# Patient Record
Sex: Female | Born: 1940 | Race: Black or African American | Hispanic: No | Smoking: Never smoker
Health system: Southern US, Community
[De-identification: ages and names within clinical notes are randomized; demographics above are authoritative.]

## PROBLEM LIST (undated history)

## (undated) DIAGNOSIS — R221 Localized swelling, mass and lump, neck: Secondary | ICD-10-CM

## (undated) DIAGNOSIS — I10 Essential (primary) hypertension: Secondary | ICD-10-CM

## (undated) DIAGNOSIS — R220 Localized swelling, mass and lump, head: Secondary | ICD-10-CM

## (undated) DIAGNOSIS — E11329 Type 2 diabetes mellitus with mild nonproliferative diabetic retinopathy without macular edema: Secondary | ICD-10-CM

## (undated) DIAGNOSIS — L500 Allergic urticaria: Secondary | ICD-10-CM

## (undated) DIAGNOSIS — G459 Transient cerebral ischemic attack, unspecified: Secondary | ICD-10-CM

## (undated) DIAGNOSIS — E119 Type 2 diabetes mellitus without complications: Secondary | ICD-10-CM

## (undated) DIAGNOSIS — E785 Hyperlipidemia, unspecified: Secondary | ICD-10-CM

## (undated) DIAGNOSIS — E041 Nontoxic single thyroid nodule: Secondary | ICD-10-CM

## (undated) HISTORY — DX: Essential (primary) hypertension: I10

## (undated) HISTORY — DX: Type 2 diabetes mellitus without complications: E11.9

## (undated) HISTORY — DX: Localized swelling, mass and lump, head: R22.1

## (undated) HISTORY — DX: Hyperlipidemia, unspecified: E78.5

## (undated) HISTORY — DX: Nontoxic single thyroid nodule: E04.1

## (undated) HISTORY — DX: Transient cerebral ischemic attack, unspecified: G45.9

## (undated) HISTORY — DX: Type 2 diabetes mellitus with mild nonproliferative diabetic retinopathy without macular edema: E11.329

## (undated) HISTORY — DX: Allergic urticaria: L50.0

## (undated) HISTORY — DX: Localized swelling, mass and lump, head: R22.0

---

## 2004-03-03 ENCOUNTER — Other Ambulatory Visit: Admission: RE | Admit: 2004-03-03 | Discharge: 2004-03-03 | Payer: Self-pay | Admitting: Otolaryngology

## 2007-02-16 HISTORY — PX: OTHER SURGICAL HISTORY: SHX169

## 2008-07-31 ENCOUNTER — Emergency Department (HOSPITAL_COMMUNITY): Admission: EM | Admit: 2008-07-31 | Discharge: 2008-07-31 | Payer: Self-pay | Admitting: Emergency Medicine

## 2008-12-16 HISTORY — PX: CATARACT EXTRACTION: SUR2

## 2009-01-02 DIAGNOSIS — E119 Type 2 diabetes mellitus without complications: Secondary | ICD-10-CM | POA: Insufficient documentation

## 2009-01-02 DIAGNOSIS — E11319 Type 2 diabetes mellitus with unspecified diabetic retinopathy without macular edema: Secondary | ICD-10-CM | POA: Insufficient documentation

## 2009-01-03 ENCOUNTER — Ambulatory Visit: Payer: Self-pay | Admitting: Internal Medicine

## 2009-01-03 DIAGNOSIS — R220 Localized swelling, mass and lump, head: Secondary | ICD-10-CM | POA: Insufficient documentation

## 2009-01-03 DIAGNOSIS — R221 Localized swelling, mass and lump, neck: Secondary | ICD-10-CM | POA: Insufficient documentation

## 2009-01-03 DIAGNOSIS — R5381 Other malaise: Secondary | ICD-10-CM | POA: Insufficient documentation

## 2009-01-03 DIAGNOSIS — E1159 Type 2 diabetes mellitus with other circulatory complications: Secondary | ICD-10-CM | POA: Insufficient documentation

## 2009-01-03 DIAGNOSIS — R5383 Other fatigue: Secondary | ICD-10-CM

## 2009-01-03 DIAGNOSIS — I10 Essential (primary) hypertension: Secondary | ICD-10-CM | POA: Insufficient documentation

## 2009-01-03 LAB — CONVERTED CEMR LAB
ALT: 17 units/L (ref 0–35)
AST: 16 units/L (ref 0–37)
Albumin: 4.1 g/dL (ref 3.5–5.2)
Alkaline Phosphatase: 53 units/L (ref 39–117)
BUN: 17 mg/dL (ref 6–23)
Basophils Absolute: 0.1 10*3/uL (ref 0.0–0.1)
Basophils Relative: 0.7 % (ref 0.0–3.0)
Bilirubin, Direct: 0.1 mg/dL (ref 0.0–0.3)
CO2: 30 meq/L (ref 19–32)
Calcium: 10.1 mg/dL (ref 8.4–10.5)
Chloride: 107 meq/L (ref 96–112)
Cholesterol: 191 mg/dL (ref 0–200)
Creatinine, Ser: 0.9 mg/dL (ref 0.4–1.2)
Eosinophils Absolute: 0.4 10*3/uL (ref 0.0–0.7)
Eosinophils Relative: 4.7 % (ref 0.0–5.0)
GFR calc non Af Amer: 80.24 mL/min (ref >60)
Glucose, Bld: 130 mg/dL — ABNORMAL HIGH (ref 70–99)
HCT: 37.4 % (ref 36.0–46.0)
HDL: 60.8 mg/dL (ref >39.00)
Hemoglobin: 12.9 g/dL (ref 12.0–15.0)
Hgb A1c MFr Bld: 7.6 % — ABNORMAL HIGH (ref 4.6–6.5)
LDL Cholesterol: 103 mg/dL — ABNORMAL HIGH (ref 0–99)
Lymphocytes Relative: 34.8 % (ref 12.0–46.0)
Lymphs Abs: 2.7 10*3/uL (ref 0.7–4.0)
MCHC: 34.5 g/dL (ref 30.0–36.0)
MCV: 93.8 fL (ref 78.0–100.0)
Monocytes Absolute: 0.6 10*3/uL (ref 0.1–1.0)
Monocytes Relative: 7.2 % (ref 3.0–12.0)
Neutro Abs: 3.9 10*3/uL (ref 1.4–7.7)
Neutrophils Relative %: 52.6 % (ref 43.0–77.0)
Platelets: 206 10*3/uL (ref 150.0–400.0)
Potassium: 5 meq/L (ref 3.5–5.1)
RBC: 3.98 M/uL (ref 3.87–5.11)
RDW: 14.8 % — ABNORMAL HIGH (ref 11.5–14.6)
Sodium: 143 meq/L (ref 135–145)
TSH: 1.81 microintl units/mL (ref 0.35–5.50)
Total Bilirubin: 0.9 mg/dL (ref 0.3–1.2)
Total CHOL/HDL Ratio: 3
Total Protein: 7.5 g/dL (ref 6.0–8.3)
Triglycerides: 137 mg/dL (ref 0.0–149.0)
VLDL: 27.4 mg/dL (ref 0.0–40.0)
WBC: 7.7 10*3/uL (ref 4.5–10.5)

## 2009-01-07 ENCOUNTER — Inpatient Hospital Stay (HOSPITAL_COMMUNITY): Admission: EM | Admit: 2009-01-07 | Discharge: 2009-01-09 | Payer: Self-pay | Admitting: Obstetrics and Gynecology

## 2009-01-07 LAB — CONVERTED CEMR LAB
Basophils Relative: 0 %
Creatinine, Ser: 1 mg/dL
Lymphocytes, automated: 24 %
MCV: 92.2 fL
Neutrophils Relative %: 59 %
Platelets: 169 10*3/uL
Potassium: 4.5 meq/L
RBC: 3.95 M/uL
RDW: 15.1 %
Sodium: 138 meq/L

## 2009-01-08 LAB — CONVERTED CEMR LAB
BUN: 14 mg/dL
Chloride: 106 meq/L
Creatinine, Ser: 0.91 mg/dL
HCT: 34.8 %
Hemoglobin: 11.8 g/dL
MCV: 94.3 fL
Platelets: 168 10*3/uL
WBC: 15.1 10*3/uL

## 2009-01-09 LAB — CONVERTED CEMR LAB
ALT: 8 units/L
AST: 20 units/L
Alkaline Phosphatase: 57 units/L
CO2: 30 meq/L
Calcium: 9.4 mg/dL
Cholesterol: 169 mg/dL
Glucose, Bld: 274 mg/dL
HCT: 37 %
LDL Cholesterol: 77 mg/dL
MCV: 93.8 fL
RBC: 3.95 M/uL
RDW: 14.8 %
Sodium: 140 meq/L
Total Bilirubin: 1.1 mg/dL
Triglyceride fasting, serum: 105 mg/dL

## 2009-01-14 ENCOUNTER — Telehealth (INDEPENDENT_AMBULATORY_CARE_PROVIDER_SITE_OTHER): Payer: Self-pay | Admitting: *Deleted

## 2009-01-16 ENCOUNTER — Encounter: Admission: RE | Admit: 2009-01-16 | Discharge: 2009-01-16 | Payer: Self-pay | Admitting: Internal Medicine

## 2009-01-16 LAB — HM MAMMOGRAPHY: HM Mammogram: NEGATIVE

## 2009-01-20 ENCOUNTER — Ambulatory Visit: Payer: Self-pay | Admitting: Internal Medicine

## 2009-01-20 DIAGNOSIS — L500 Allergic urticaria: Secondary | ICD-10-CM | POA: Insufficient documentation

## 2009-01-22 ENCOUNTER — Ambulatory Visit: Payer: Self-pay | Admitting: Internal Medicine

## 2009-01-27 ENCOUNTER — Encounter: Admission: RE | Admit: 2009-01-27 | Discharge: 2009-01-27 | Payer: Self-pay | Admitting: Internal Medicine

## 2009-04-25 ENCOUNTER — Encounter (INDEPENDENT_AMBULATORY_CARE_PROVIDER_SITE_OTHER): Payer: Self-pay | Admitting: *Deleted

## 2009-05-06 ENCOUNTER — Ambulatory Visit: Payer: Self-pay | Admitting: Internal Medicine

## 2009-06-18 ENCOUNTER — Ambulatory Visit: Payer: Self-pay | Admitting: Internal Medicine

## 2009-06-18 LAB — CONVERTED CEMR LAB: Creatinine, Ser: 1 mg/dL (ref 0.4–1.2)

## 2009-06-24 ENCOUNTER — Encounter (INDEPENDENT_AMBULATORY_CARE_PROVIDER_SITE_OTHER): Payer: Self-pay | Admitting: *Deleted

## 2009-06-26 ENCOUNTER — Ambulatory Visit: Payer: Self-pay | Admitting: Family Medicine

## 2009-06-26 ENCOUNTER — Encounter: Payer: Self-pay | Admitting: Internal Medicine

## 2009-07-03 ENCOUNTER — Encounter (INDEPENDENT_AMBULATORY_CARE_PROVIDER_SITE_OTHER): Payer: Self-pay

## 2009-07-08 ENCOUNTER — Ambulatory Visit: Payer: Self-pay | Admitting: Internal Medicine

## 2009-07-08 ENCOUNTER — Encounter (INDEPENDENT_AMBULATORY_CARE_PROVIDER_SITE_OTHER): Payer: Self-pay

## 2009-08-28 ENCOUNTER — Telehealth: Payer: Self-pay | Admitting: Internal Medicine

## 2009-08-29 ENCOUNTER — Ambulatory Visit: Payer: Self-pay | Admitting: Internal Medicine

## 2009-08-29 LAB — HM COLONOSCOPY

## 2009-10-27 ENCOUNTER — Ambulatory Visit: Payer: Self-pay | Admitting: Internal Medicine

## 2009-10-27 LAB — CONVERTED CEMR LAB: Hgb A1c MFr Bld: 7.3 % — ABNORMAL HIGH (ref 4.6–6.5)

## 2009-10-27 LAB — HM DIABETES FOOT EXAM: HM Diabetic Foot Exam: NORMAL

## 2010-01-15 ENCOUNTER — Encounter: Payer: Self-pay | Admitting: Internal Medicine

## 2010-01-15 LAB — HM DIABETES EYE EXAM

## 2010-01-19 ENCOUNTER — Telehealth: Payer: Self-pay | Admitting: Internal Medicine

## 2010-02-04 ENCOUNTER — Encounter: Payer: Self-pay | Admitting: Internal Medicine

## 2010-02-04 DIAGNOSIS — E11329 Type 2 diabetes mellitus with mild nonproliferative diabetic retinopathy without macular edema: Secondary | ICD-10-CM

## 2010-02-04 DIAGNOSIS — E113299 Type 2 diabetes mellitus with mild nonproliferative diabetic retinopathy without macular edema, unspecified eye: Secondary | ICD-10-CM | POA: Insufficient documentation

## 2010-02-05 ENCOUNTER — Ambulatory Visit: Payer: Self-pay

## 2010-02-05 ENCOUNTER — Encounter: Payer: Self-pay | Admitting: Internal Medicine

## 2010-02-24 ENCOUNTER — Ambulatory Visit
Admission: RE | Admit: 2010-02-24 | Discharge: 2010-02-24 | Payer: Self-pay | Source: Home / Self Care | Attending: Internal Medicine | Admitting: Internal Medicine

## 2010-02-24 ENCOUNTER — Other Ambulatory Visit: Payer: Self-pay | Admitting: Internal Medicine

## 2010-02-24 LAB — BASIC METABOLIC PANEL
BUN: 25 mg/dL — ABNORMAL HIGH (ref 6–23)
CO2: 32 mEq/L (ref 19–32)
Calcium: 9.5 mg/dL (ref 8.4–10.5)
Chloride: 104 mEq/L (ref 96–112)
Creatinine, Ser: 1.1 mg/dL (ref 0.4–1.2)
GFR: 64.11 mL/min (ref >60.00)
Glucose, Bld: 123 mg/dL — ABNORMAL HIGH (ref 70–99)
Potassium: 4.6 mEq/L (ref 3.5–5.1)
Sodium: 142 mEq/L (ref 135–145)

## 2010-02-24 LAB — HEMOGLOBIN A1C: Hgb A1c MFr Bld: 7.4 % — ABNORMAL HIGH (ref 4.6–6.5)

## 2010-03-17 NOTE — Letter (Signed)
Summary: Diabetic Instructions  [facility]  [address] [place], [place] [address]   Phone: [phone]  Fax: [phone]    Patricia Mccullough [birth year] MRN: [id number]   _  _   ORAL DIABETIC MEDICATION INSTRUCTIONS  The day before your procedure:   Take your diabetic pill as you do normally  The day of your procedure:   Do not take your diabetic pill    We will check your blood sugar levels during the admission process and again in Recovery before discharging you home  ________________________________________________________________________  _  _   INSULIN (LONG ACTING) MEDICATION INSTRUCTIONS (Lantus, NPH, 70/30, Humulin, Novolin-N)   The day before your procedure:   Take  your regular evening dose    The day of your procedure:   Do not take your morning dose    _  _   INSULIN (SHORT ACTING) MEDICATION INSTRUCTIONS (Regular, Humulog, Novolog)   The day before your procedure:   Do not take your evening dose   The day of your procedure:   Do not take your morning dose   _  _   INSULIN PUMP MEDICATION INSTRUCTIONS  We will contact the physician managing your diabetic care for written dosage instructions for the day before your procedure and the day of your procedure.  Once we have received the instructions, we will contact you.

## 2010-03-17 NOTE — Letter (Signed)
Summary: [facility] [place]   Imported By: Lester [place] 01/21/2010 10:35:32  _____________________________________________________________________  External Attachment:    Type:   Image     Comment:   External Document

## 2010-03-17 NOTE — Progress Notes (Signed)
Summary: Questions about procedure  Phone Note Call from Patient Call back at Home Phone [phone]   Caller: Patient Call For: Dr. Juanda Chance Summary of Call: pt. has missed place her instructions for her COL tomorrow and has some questions Initial call taken by: Karna Christmas,  August 28, 2009 9:12 AM  Follow-up for Phone Call        Spoke with pt earlier to answer any questions.  Pt states "Someone from there already called me today."  No further questions.  Spoke with other previsit nurse, who states that she didn't call pt.  Attempted to reach pt again, but left message on answering machine. Follow-up by: Karl Bales RN,  August 28, 2009 12:26 PM  Additional Follow-up for Phone Call Additional follow up Details #1::        Attempted to reach pt again; unable to do so. Left message to call office  Additional Follow-up by: Karl Bales RN,  August 28, 2009 3:23 PM

## 2010-03-17 NOTE — Letter (Signed)
Summary: Miralax Instructions  [facility]  [address]   [place], [place] [address]   Phone: [phone]  Fax: [phone]       Patricia Mccullough    Jan 04, 1941    MRN: [id number]       Procedure Day Dorna Bloom: Tuesday, 07-29-09     Arrival Time: 10:30 a.m.     Procedure Time: 11:30 a.m.     Location of Procedure:                    x   [facility] (4th Floor)    PREPARATION FOR COLONOSCOPY WITH MIRALAX  Starting 5 days prior to your procedure 07-24-09  do not eat nuts, seeds, popcorn, corn, beans, peas,  salads, or any raw vegetables.  Do not take any fiber supplements (e.g. Metamucil, Citrucel, and Benefiber). ____________________________________________________________________________________________________   THE DAY BEFORE YOUR PROCEDURE         DATE: 07-28-09   DAY: Monday  1   Drink clear liquids the entire day-NO SOLID FOOD  2   Do not drink anything colored red or purple.  Avoid juices with pulp.  No orange juice.  3   Drink at least 64 oz. (8 glasses) of fluid/clear liquids during the day to prevent dehydration and help the prep work efficiently.  CLEAR LIQUIDS INCLUDE: Water Jello Ice Popsicles Tea (sugar ok, no milk/cream) Powdered fruit flavored drinks Coffee (sugar ok, no milk/cream) Gatorade Juice: apple, white grape, white cranberry  Lemonade Clear bullion, consomm, broth Carbonated beverages (any kind) Strained chicken noodle soup Hard Candy  4   Mix the entire bottle of Miralax with 64 oz. of Gatorade/Powerade in the morning and put in the refrigerator to chill.  5   At 3:00 pm take 2 Dulcolax/Bisacodyl tablets.  6   At 4:30 pm take one Reglan/Metoclopramide tablet.  7  Starting at 5:00 pm drink one 8 oz glass of the Miralax mixture every 15-20 minutes until you have finished drinking the entire 64 oz.  You should finish drinking prep around 7:30 or 8:00 pm.  8   If you are nauseated, you may take the 2nd  Reglan/Metoclopramide tablet at 6:30 pm.        9    At 8:00 pm take 2 more DULCOLAX/Bisacodyl tablets.     THE DAY OF YOUR PROCEDURE      DATE:   07-29-09 DAY: Tuesday  You may drink clear liquids until 9:30 a.m.  (2 HOURS BEFORE PROCEDURE).   MEDICATION INSTRUCTIONS  Unless otherwise instructed, you should take regular prescription medications with a small sip of water as early as possible the morning of your procedure.  Diabetic patients - see separate instructions.         OTHER INSTRUCTIONS  You will need a responsible adult at least 69 years of age to accompany you and drive you home.   This person must remain in the waiting room during your procedure.  Wear loose fitting clothing that is easily removed.  Leave jewelry and other valuables at home.  However, you may wish to bring a book to read or an iPod/MP3 player to listen to music as you wait for your procedure to start.  Remove all body piercing jewelry and leave at home.  Total time from sign-in until discharge is approximately 2-3 hours.  You should go home directly after your procedure and rest.  You can resume normal activities the day after your procedure.  The day of  your procedure you should not:   Drive   Make legal decisions   Operate machinery   Drink alcohol   Return to work  You will receive specific instructions about eating, activities and medications before you leave.   The above instructions have been reviewed and explained to me by   Ulis Rias RN  Jul 08, 2009 11:30 AM     I fully understand and can verbalize these instructions _____________________________ Date _______

## 2010-03-17 NOTE — Assessment & Plan Note (Signed)
Summary: f/u appt/#/cd   Vital Signs:  Patient profile:   70 year old female Height:      62.5 inches (158.75 cm) Weight:      157.12 pounds (71.42 kg) O2 Sat:      98 % on Room air Temp:     98.4 degrees F (36.89 degrees C) oral Pulse rate:   85 / minute BP sitting:   130 / 78  (left arm) Cuff size:   regular  Vitals Entered By: Orlan Leavens (Jun 18, 2009 10:21 AM)  O2 Flow:  Room air CC: follow-up visit Is Patient Diabetic? Yes Did you bring your meter with you today? No Pain Assessment Patient in pain? no        Primary Care Provider:  Newt Lukes MD  CC:  follow-up visit.  History of Present Illness: here for followup -   DM2 - CBGs running 110-200 - checks sugars between 1-3 pm few time per week - reports usually compliant (80%) with meds and less compliant with diet control - no symptoms hypoglycemia  HTN - reports episodic compliance with ongoing medical treatment and no changes in medication dose or frequency. denies adverse side effects related to current therapy.  on norvasc -"when i remember"  - no adv SE or periph edema  allergic urticaria - no further reactions since 01/2009 - no itch or hives  requests bone density and colo referrals  surg for victrectomy last week- right eye, no pain or vision loss  Clinical Review Panels:  Lipid Management   Cholesterol:  169 (01/09/2009)   LDL (bad choesterol):  77 (01/09/2009)   HDL (good cholesterol):  71 (01/09/2009)   Triglycerides:  105 (01/09/2009)  Diabetes Management   HgBA1C:  7.6 (01/03/2009)   Creatinine:  0.83 (01/09/2009)   Last Dilated Eye Exam:  diabetic retinopathy,mild (03/17/2009)   Last Foot Exam:  yes (01/20/2009)   Current Medications (verified): 1)  Metformin Hcl 1000 Mg Tabs (Metformin Hcl) .... Take 1 Two Times A Day 2)  Xibrom 0.09 % Soln (Bromfenac Sodium) .Marland Kitchen.. 1 Drop Two Times A Day 3)  Prednisone Acetat  1% Suspension 4)  Norvasc 5 Mg Tabs (Amlodipine Besylate) 5)   Allegra 180 Mg Tabs (Fexofenadine Hcl) .... One By Mouth Once Daily As Needed For Itching 6)  Blink Eye Drops (Ovc) .... Use 1 Drop in (L) Eye Qd 7)  Vigamox 0.5 % Soln (Moxifloxacin Hcl) .... 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    Physical Exam  General:  alert, well-developed, well-nourished, and cooperative to examination.    Eyes:  red conjunctivitis on right (s/p eye victrectomy last week) vision grossly intact; pupils equal, round and reactive to light.  lids normal.    Lungs:  normal respiratory effort, no intercostal retractions or use of accessory muscles; normal breath sounds bilaterally - no crackles and no wheezes.    Heart:  normal rate, regular rhythm, no murmur, and no rub. BLE without edema.   Impression & Recommendations:  Problem # 1:  DIABETES MELLITUS, TYPE II (ICD-250.00)  Her updated medication list for this problem includes:    Metformin Hcl 1000 Mg Tabs (Metformin hcl) .Marland Kitchen... Take 1 two times a day  Labs Reviewed: Creat:  0.83 (01/09/2009)     Last Eye Exam: diabetic retinopathy,mild (03/17/2009) Reviewed HgBA1c results: 7.6 (01/03/2009)  Orders: TLB-A1C / Hgb A1C (Glycohemoglobin) (83036-A1C) TLB-Creatinine, Blood (82565-CREA)  Problem # 2:  HYPERTENSION (ICD-401.9)  Her updated medication list for this problem includes:    Norvasc 5 Mg Tabs (Amlodipine besylate)  Orders: TLB-Creatinine, Blood (82565-CREA)  BP today: 130/78 Prior BP: 148/92 (01/22/2009)  Prior 10 Yr Risk Heart Disease: 17 % (01/20/2009)  Labs Reviewed: K+: 4.0 (01/09/2009) Creat: : 0.83 (01/09/2009)   Chol: 169 (01/09/2009)   HDL: 71 (01/09/2009)   LDL: 21 (01/09/2009)   TG: 105 (01/09/2009)  Problem # 3:  SPECIAL  SCREENING FOR OSTEOPOROSIS (ICD-V82.81)  Orders: T-Bone Densitometry (51025) T-Lumbar Vertebral Assessment (85277)  Problem # 4:  SCREENING, COLON CANCER (ICD-V76.51)  Orders: Gastroenterology Referral (GI)  Complete Medication List: 1)  Metformin Hcl 1000 Mg Tabs (Metformin hcl) .... Take 1 two times a day 2)  Xibrom 0.09 % Soln (Bromfenac sodium) .Marland Kitchen.. 1 drop two times a day 3)  Prednisone Acetat 1% Suspension  4)  Norvasc 5 Mg Tabs (Amlodipine besylate) 5)  Allegra 180 Mg Tabs (Fexofenadine hcl) .... One by mouth once daily as needed for itching 6)  Blink Eye Drops (ovc)  .... Use 1 drop in (l) eye qd 7)  Vigamox 0.5 % Soln (Moxifloxacin hcl) .... Use 1 drop in (r) eye qid  Patient Instructions: 1)  it was good to see you today.  2)  test(s) ordered today - your results will be called to you in 48-72 hours from the time of test completion - if changes need to be made to your medications, we will let you know at that time - 3)  we'll make referral for bone density and colonoscopy . Our office will contact you regarding this appointment once made.  4)  Please schedule a follow-up appointment in 4 months to monitor diabetes and blood pressure, call sooner if problems.

## 2010-03-17 NOTE — Miscellaneous (Signed)
Summary: Lec previsit  Clinical Lists Changes  Medications: Added new medication of MIRALAX   POWD (POLYETHYLENE GLYCOL 3350) As per prep  instructions. - Signed Added new medication of REGLAN 10 MG  TABS (METOCLOPRAMIDE HCL) As per prep instructions. - Signed Added new medication of DULCOLAX 5 MG  TBEC (BISACODYL) Day before procedure take 2 at 3pm and 2 at 8pm. - Signed Rx of MIRALAX   POWD (POLYETHYLENE GLYCOL 3350) As per prep  instructions.;  #255gm x 0;  Signed;  Entered by: Ulis Rias RN;  Authorized by: Hart Carwin MD;  Method used: Electronically to [facility] [address] [place], [place], [place]  [address], Ph: [phone], Fax: [phone] Rx of REGLAN 10 MG  TABS (METOCLOPRAMIDE HCL) As per prep instructions.;  #2 x 0;  Signed;  Entered by: Ulis Rias RN;  Authorized by: Hart Carwin MD;  Method used: Electronically to [facility] [address] [place], [place], [place]  [address], Ph: [phone], Fax: [phone] Rx of DULCOLAX 5 MG  TBEC (BISACODYL) Day before procedure take 2 at 3pm and 2 at 8pm.;  #4 x 0;  Signed;  Entered by: Ulis Rias RN;  Authorized by: Hart Carwin MD;  Method used: Electronically to [facility] [address], [place], [place], [place]  [address], Ph: [phone], Fax: [phone] Allergies: Added new allergy or adverse reaction of * BIGEN Observations: Added new observation of NKA: F (07/08/2009 11:05)    Prescriptions: DULCOLAX 5 MG  TBEC (BISACODYL) Day before procedure take 2 at 3pm and 2 at 8pm.  #4 x 0   Entered by:   Ulis Rias RN   Authorized by:   Hart Carwin MD   Signed by:   Ulis Rias RN on 07/08/2009   Method used:   Electronically to        [facility] (retail)       [address]       [place], [place]  [address]       Ph: [phone]       Fax: [phone]   RxID:   [id number] REGLAN 10 MG  TABS (METOCLOPRAMIDE HCL) As per  prep instructions.  #2 x 0   Entered by:   Ulis Rias RN   Authorized by:   Hart Carwin MD   Signed by:   Ulis Rias RN on 07/08/2009   Method used:   Electronically to        [facility] (retail)       [address]       [place], [place]  [address]       Ph: [phone]       Fax: [phone]   RxID:   [id number] MIRALAX   POWD (POLYETHYLENE GLYCOL 3350) As per prep  instructions.  #255gm x 0   Entered by:   Ulis Rias RN   Authorized by:   Hart Carwin MD   Signed by:   Ulis Rias RN on 07/08/2009   Method used:   Electronically to        [facility] (retail)       [address]       [place], [place]  [address]       Ph:  [phone]       Fax: [phone]   RxID:   [id number]

## 2010-03-17 NOTE — Letter (Signed)
Summary: Referral - not able to see patient  [facility]  [address] [place], [place] [address]   Phone: [phone]  Fax: [phone]    April 25, 2009          Dr. Rene Paci [address] [place], [place]  [address]             Re:   Patricia Mccullough DOB:  20-Nov-1940 MRN:   [id number]    Dear Dr. Felicity Coyer:  Thank you for your kind referral of the above patient.  We have attempted to schedule the recommended procedure, colonoscopy, but have not been able to schedule because:  _x_ The patient was not available by phone and/or has not returned our calls.  ___ The patient declined to schedule the procedure at this time.  We appreciate the referral and hope that we will have the opportunity to treat this patient in the future.    Sincerely,    [facility] [phone]

## 2010-03-17 NOTE — Letter (Signed)
Summary: Previsit letter  [facility]  [address] [place], [place] [address]   Phone: [phone]  Fax: [phone]       06/24/2009 MRN: [id number]  [facility] [address] [place], [place]  [address]  Dear Ms. Mayford Knife,  Welcome to the Gastroenterology Division at [facility].    You are scheduled to see a nurse for your pre-procedure visit on 06-26-09 at 10am on the 3rd floor at [facility], [address]  We ask that you try to arrive at our office 15 minutes prior to your appointment time to allow for check-in.  Your nurse visit will consist of discussing your medical and surgical history, your immediate family medical history, and your medications.    Please bring a complete list of all your medications or, if you prefer, bring the medication bottles and we will list them.  We will need to be aware of both prescribed and over the counter drugs.  We will need to know exact dosage information as well.  If you are on blood thinners (Coumadin, Plavix, Aggrenox, Ticlid, etc.) please call our office today/prior to your appointment, as we need to consult with your physician about holding your medication.   Please be prepared to read and sign documents such as consent forms, a financial agreement, and acknowledgement forms.  If necessary, and with your consent, a friend or relative is welcome to sit-in on the nurse visit with you.  Please bring your insurance card so that we may make a copy of it.  If your insurance requires a referral to see a specialist, please bring your referral form from your primary care physician.  No co-pay is required for this nurse visit.     If you cannot keep your appointment, please call [phone] to cancel or reschedule prior to your appointment date.  This allows [place] the opportunity to schedule an appointment for another patient in need of care.    Thank you for choosing [facility] for your medical  needs.  We appreciate the opportunity to care for you.  Please visit [place] at our website  to learn more about our practice.                     Sincerely.                                                                                                                   The Gastroenterology Division

## 2010-03-17 NOTE — Progress Notes (Signed)
Summary: optho request carotid US due to asymetric DM changes 01/15/10 exa  Phone Note From Other Clinic   Summary of Call: fax from pt optho - dr. Idelia Salm recommending carotid US due to DR asymmetry (mild DM change on right, mod on left) - order placed - lucy please enter eye exam findings if possible - thanks Initial call taken by: Newt Lukes MD,  January 19, 2010 1:13 PM  Follow-up for Phone Call        Entered exam in EMR Follow-up by: Orlan Leavens RMA,  January 19, 2010 1:48 PM      Diabetic Eye Exam  Procedure date:  01/15/2010  Findings:      Done @ [facility] [place] Impression: Right eye- Mild nonproliferative diabetic retinopathy Left eye- Moderate nonproleferative diabetic retinopathy

## 2010-03-17 NOTE — Procedures (Signed)
Summary: Colonoscopy  Patient: Patricia Mccullough Note: All result statuses are Final unless otherwise noted.  Tests: (1) Colonoscopy (COL)   COL Colonoscopy           DONE     [facility]     [address]     [place], [place]  [address]           COLONOSCOPY PROCEDURE REPORT           PATIENT:  [name], [name]  MR#:  [id number]     BIRTHDATE:  1941-01-01, 68 yrs. old  GENDER:  female     ENDOSCOPIST:  Hedwig Morton. Juanda Chance, MD     REF. BY:  Rene Paci, M.D.     PROCEDURE DATE:  08/29/2009     PROCEDURE:  Colonoscopy [address]     ASA CLASS:  Class II     INDICATIONS:  Routine Risk Screening     MEDICATIONS:   Versed 7 mg, Fentanyl 50 mcg           DESCRIPTION OF PROCEDURE:   After the risks benefits and     alternatives of the procedure were thoroughly explained, informed     consent was obtained.  Digital rectal exam was performed and     revealed no rectal masses.   The LB [id number] endoscope     was introduced through the anus and advanced to the cecum, which     was identified by both the appendix and ileocecal valve, without     limitations.  The quality of the prep was good, using MiraLax.     The instrument was then slowly withdrawn as the colon was fully     examined.     <<PROCEDUREIMAGES>>           FINDINGS:  No polyps or cancers were seen (see image1, image2,     image3, image4, and image5). spasm left colon   Retroflexed views     in the rectum revealed no abnormalities.    The scope was then     withdrawn from the patient and the procedure completed.           COMPLICATIONS:  None     ENDOSCOPIC IMPRESSION:     1) No polyps or cancers     spasm left colon, difficult but normal exam     RECOMMENDATIONS:     1) high fiber diet     REPEAT EXAM:  In 10 year(s) for.           ______________________________     Hedwig Morton. Juanda Chance, MD           CC:           n.     eSIGNED:   Hedwig Morton. Brodie at 08/29/2009 12:15 PM           Earline Mayotte,  [id number]  Note: An exclamation mark (!) indicates a result that was not dispersed into the flowsheet. Document Creation Date: 08/29/2009 12:17 PM _______________________________________________________________________  (1) Order result status: Final Collection or observation date-time: 08/29/2009 12:11 Requested date-time:  Receipt date-time:  Reported date-time:  Referring Physician:   Ordering Physician: Lina Sar [phone]) Specimen Source:  Source: Launa Grill Order Number: [id number] Lab site:   Appended Document: Colonoscopy     Procedures Next Due Date:    Colonoscopy: 08/2019

## 2010-03-17 NOTE — Miscellaneous (Signed)
Summary: BONE DENSITY  Clinical Lists Changes  Orders: Added new Test order of T-Bone Densitometry (77080) - Signed Added new Test order of T-Lumbar Vertebral Assessment (77082) - Signed 

## 2010-03-17 NOTE — Assessment & Plan Note (Signed)
Summary: 4 MTH FU  STC   RS'D PER PT/NWS   Vital Signs:  Patient profile:   70 year old female Height:      62.5 inches (158.75 cm) Weight:      166.8 pounds (75.82 kg) BMI:     30.13 O2 Sat:      99 % on Room air Temp:     97.2 degrees F (36.22 degrees C) oral Pulse rate:   77 / minute BP sitting:   162 / 92  (left arm) Cuff size:   regular  Vitals Entered By: Orlan Leavens RMA (October 27, 2009 9:18 AM)  Nutrition Counseling: Patient's BMI is greater than 25 and therefore counseled on weight management options.  O2 Flow:  Room air CC: 4 month follow-up Is Patient Diabetic? Yes Did you bring your meter with you today? No Pain Assessment Patient in pain? no        Primary Care Provider:  Newt Lukes MD  CC:  4 month follow-up.  History of Present Illness: here for followup -   DM2 - CBGs running 80-140s - checks sugars between 1-3 pm few time per week - reports usually compliant (80%) with meds and less compliant with diet control - no symptoms hypoglycemia - s/p prior victrectomy right eye, no pain or vision loss - glimepiride added to metformin 06/2009 d/t uncontrolled cbgs  HTN - reports episodic compliance with ongoing medical treatment and no changes in medication dose or frequency. denies adverse side effects related to current therapy.  on norvasc -"when i remember"  - no adv SE or periph edema  allergic urticaria - no further reactions since 01/2009 - no itch or hives   Clinical Review Panels:  Immunizations   Last Tetanus Booster:  Historical (02/16/2003)  Lipid Management   Cholesterol:  169 (01/09/2009)   LDL (bad choesterol):  77 (01/09/2009)   HDL (good cholesterol):  71 (01/09/2009)   Triglycerides:  105 (01/09/2009)  Diabetes Management   HgBA1C:  9.1 (06/18/2009)   Creatinine:  1.0 (06/18/2009)   Last Dilated Eye Exam:  diabetic retinopathy,mild (03/17/2009)   Last Foot Exam:  yes (10/27/2009)  CBC   WBC:  17.0 (01/09/2009)   RBC:   3.95 (01/09/2009)   Hgb:  12.6 (01/09/2009)   Hct:  37.0 (01/09/2009)   Platelets:  192 (01/09/2009)   MCV  93.8 (01/09/2009)   MCHC  34.5 (01/03/2009)   RDW  14.8 (01/09/2009)   PMN:  59 (01/07/2009)   Lymphs:  34.8 (01/03/2009)   Monos:  10 (01/07/2009)   Eosinophils:  7 (01/07/2009)   Basophil:  0 (01/07/2009)  Complete Metabolic Panel   Glucose:  274 (01/09/2009)   Sodium:  140 (01/09/2009)   Potassium:  4.0 (01/09/2009)   Chloride:  104 (01/09/2009)   CO2:  30 (01/09/2009)   BUN:  14 (01/09/2009)   Creatinine:  1.0 (06/18/2009)   Albumin:  3.4 (01/09/2009)   Total Protein:  6.8 (01/09/2009)   Calcium:  9.4 (01/09/2009)   Total Bili:  1.1 (01/09/2009)   Alk Phos:  57 (01/09/2009)   SGPT (ALT):  8 (01/09/2009)   SGOT (AST):  20 (01/09/2009)   Current Medications (verified): 1)  Metformin Hcl 1000 Mg Tabs (Metformin Hcl) .... Take 1 Two Times A Day 2)  Glimepiride 2 Mg Tabs (Glimepiride) .Marland Kitchen.. 1 By Mouth Once Daily  Allergies (verified): 1)  ! 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    Lungs:  normal respiratory effort, no intercostal retractions or use of accessory muscles; normal breath sounds bilaterally - no crackles and no wheezes.    Heart:  normal rate, regular rhythm, no murmur, and no rub.  BLE without edema. Psych:  Oriented X3, memory intact for recent and remote, normally interactive, good eye contact, not anxious appearing, and not depressed appearing.    Diabetes Management Exam:    Foot Exam (with socks and/or shoes not present):       Sensory-Pinprick/Light touch:          Left medial foot (L-4): normal          Left dorsal foot (L-5): normal          Left lateral foot (S-1): normal          Right medial foot (L-4): normal          Right dorsal foot (L-5): normal          Right lateral foot (S-1): normal       Sensory-Monofilament:          Left foot: normal          Right foot: normal       Inspection:          Left foot: normal          Right foot: normal       Nails:          Left foot: normal          Right foot: normal   Impression & Recommendations:  Problem # 1:  DIABETES MELLITUS, TYPE II (ICD-250.00)  glimeperide added to metformin 06/2009 - reports improved - cont same and monitor cbg also add arb - uncontrolled bp - see next Her updated medication list for this problem includes:    Metformin Hcl 1000 Mg Tabs (Metformin hcl) .Marland Kitchen... Take 1 two times a day    Glimepiride 2 Mg Tabs (Glimepiride) .Marland Kitchen... 1 by mouth once daily    Losartan Potassium-hctz 100-25 Mg Tabs (Losartan potassium-hctz) .Marland Kitchen... 1 by mouth once daily    Aspirin Ec 81 Mg Tbec (Aspirin) .Marland Kitchen... 1 by mouth once daily  Orders: TLB-A1C / Hgb A1C (Glycohemoglobin) (83036-A1C)  Labs Reviewed: Creat: 1.0 (06/18/2009)     Last Eye Exam: diabetic retinopathy,mild (03/17/2009) Reviewed HgBA1c results: 9.1 (06/18/2009)  7.6 (01/03/2009)  Problem # 2:  HYPERTENSION (ICD-401.9)  reports noncompliance with amlodipine - change med -instructed this is for DM as well as BP - new erx done pt reports she takes her DM meds 1st and forgets to take BP meds later in day (takes meds for each dz at one time) The following medications were removed from the medication list:    Norvasc 5 Mg Tabs  (Amlodipine besylate) Her updated medication list for this problem includes:    Losartan Potassium-hctz 100-25 Mg  Tabs (Losartan potassium-hctz) .Marland Kitchen... 1 by mouth once daily  Orders: Prescription Created Electronically [phone])  BP today: 162/92 Prior BP: 130/78 (06/18/2009)  Prior 10 Yr Risk Heart Disease: 17 % (01/20/2009)  Labs Reviewed: K+: 4.0 (01/09/2009) Creat: : 1.0 (06/18/2009)   Chol: 169 (01/09/2009)   HDL: 71 (01/09/2009)   LDL: 21 (01/09/2009)   TG: 105 (01/09/2009)  Complete Medication List: 1)  Metformin Hcl 1000 Mg Tabs (Metformin hcl) .... Take 1 two times a day 2)  Glimepiride 2 Mg Tabs (Glimepiride) .Marland Kitchen.. 1 by mouth once daily 3)  Losartan Potassium-hctz 100-25 Mg Tabs (Losartan potassium-hctz) .Marland Kitchen.. 1 by mouth once daily 4)  Aspirin Ec 81 Mg Tbec (Aspirin) .Marland Kitchen.. 1 by mouth once daily  Patient Instructions: 1)  it was good to see you today.  2)  test(s) ordered today - your results will be called to you in 48-72 hours from the time of test completion - if changes need to be made to your medications, we will let you know at that time - 3)  change amlodipine to losartan HCT for your blood pressure and diabetes - your prescriptions have been electronically submitted to your pharmacy. Please take as directed. Contact our office if you believe you're having problems with the medication(s).  4)  Please schedule a follow-up appointment in 4 months to monitor diabetes and blood pressure, call sooner if problems.  Prescriptions: LOSARTAN POTASSIUM-HCTZ 100-25 MG TABS (LOSARTAN POTASSIUM-HCTZ) 1 by mouth once daily  #30 x 6   Entered and Authorized by:   Newt Lukes MD   Signed by:   Newt Lukes MD on 10/27/2009   Method used:   Electronically to        [facility] (retail)       [address]       [place], [place]  [address]       Ph: [phone]       Fax: [phone]   RxID:   [id number]

## 2010-03-19 NOTE — Assessment & Plan Note (Signed)
Summary: 4 MTH FU---STC   Vital Signs:  Patient profile:   70 year old female Height:      62.5 inches Weight:      171 pounds BMI:     30.89 O2 Sat:      99 % on Room air Temp:     97.7 degrees F oral Pulse rate:   79 / minute BP sitting:   128 / 74  (left arm) Cuff size:   regular  Vitals Entered By: Bill Salinas CMA (February 24, 2010 10:58 AM)  O2 Flow:  Room air CC: pt here for follow up on diabetes and BP/ ab CBG Result 182   Primary Care Provider:  Newt Lukes MD  CC:  pt here for follow up on diabetes and BP/ ab.  History of Present Illness: here for followup -   DM2 - CBGs running 140s - checks sugars between 1-3 pm few time per week - reports usually compliant (80%) with meds and less compliant with diet control - no symptoms hypoglycemia - s/p prior victrectomy right eye, no pain or vision loss - glimepiride added to metformin 06/2009 d/t uncontrolled cbgs  HTN - reports episodic compliance with ongoing medical treatment and no new changes in medication dose or frequency. denies adverse side effects related to current therapy.  on losartan in place of norvasc since 10/2009  -   allergic urticaria - no further reactions since 01/2009 - no itch or hives   Clinical Review Panels:  Lipid Management   Cholesterol:  169 (01/09/2009)   LDL (bad choesterol):  77 (01/09/2009)   HDL (good cholesterol):  71 (01/09/2009)   Triglycerides:  105 (01/09/2009)  Diabetes Management   HgBA1C:  7.3 (10/27/2009)   Creatinine:  1.0 (06/18/2009)   Last Dilated Eye Exam:  Done @ [facility], [place] Impression: Right eye- Mild nonproliferative diabetic retinopathy Left eye- Moderate nonproleferative diabetic retinopathy (01/15/2010)   Last Foot Exam:  yes (10/27/2009)  Complete Metabolic Panel   Glucose:  274 (01/09/2009)   Sodium:  140 (01/09/2009)   Potassium:  4.0 (01/09/2009)   Chloride:  104 (01/09/2009)   CO2:  30 (01/09/2009)   BUN:  14  (01/09/2009)   Creatinine:  1.0 (06/18/2009)   Albumin:  3.4 (01/09/2009)   Total Protein:  6.8 (01/09/2009)   Calcium:  9.4 (01/09/2009)   Total Bili:  1.1 (01/09/2009)   Alk Phos:  57 (01/09/2009)   SGPT (ALT):  8 (01/09/2009)   SGOT (AST):  20 (01/09/2009)   Current Medications (verified): 1)  Metformin Hcl 1000 Mg Tabs (Metformin Hcl) .... Take 1 Two Times A Day 2)  Glimepiride 2 Mg Tabs (Glimepiride) .Marland Kitchen.. 1 By Mouth Once Daily 3)  Losartan Potassium-Hctz 100-25 Mg Tabs (Losartan Potassium-Hctz) .Marland Kitchen.. 1 By Mouth Once Daily 4)  Aspirin Ec 81 Mg Tbec (Aspirin) .Marland Kitchen.. 1 By Mouth Once Daily  Allergies (verified): 1)  ! Blossom Hoops  Past History:  Past Medical History: Diabetes mellitus, type II hypertension submandibular mass, right side 01/2009 - CT scan  MD roster: optho - fogel    Review of Systems       The patient complains of weight gain.  The patient denies anorexia, fever, weight loss, chest pain, syncope, peripheral edema, and headaches.    Physical Exam  General:  alert, well-developed, well-nourished, and cooperative to examination.    Lungs:  normal respiratory effort, no intercostal retractions or use of accessory muscles; normal breath sounds bilaterally -  no crackles and no wheezes.    Heart:  normal rate, regular rhythm, no murmur, and no rub. BLE without edema.   Impression & Recommendations:  Problem # 1:  DIABETES MELLITUS, TYPE II (ICD-250.00)  Her updated medication list for this problem includes:    Metformin Hcl 1000 Mg Tabs (Metformin hcl) .Marland Kitchen... Take 1 two times a day    Glimepiride 2 Mg Tabs (Glimepiride) .Marland Kitchen... 1 by mouth once daily    Losartan Potassium-hctz 100-25 Mg Tabs (Losartan potassium-hctz) .Marland Kitchen... 1 by mouth once daily    Aspirin Ec 81 Mg Tbec (Aspirin) .Marland Kitchen... 1 by mouth once daily  Orders: TLB-A1C / Hgb A1C (Glycohemoglobin) (83036-A1C) TLB-BMP (Basic Metabolic Panel-BMET) (80048-METABOL)  Labs Reviewed: Creat: 1.0 (06/18/2009)      Last Eye Exam: Done @ [facility], [place] Impression: Right eye- Mild nonproliferative diabetic retinopathy Left eye- Moderate nonproleferative diabetic retinopathy (01/15/2010) Reviewed HgBA1c results: 7.3 (10/27/2009)  9.1 (06/18/2009)  Problem # 2:  HYPERTENSION (ICD-401.9)  Her updated medication list for this problem includes:    Losartan Potassium-hctz 100-25 Mg Tabs (Losartan potassium-hctz) .Marland Kitchen... 1 by mouth once daily  Orders: TLB-BMP (Basic Metabolic Panel-BMET) (80048-METABOL)  BP today: 128/74 Prior BP: 162/92 (10/27/2009)  Prior 10 Yr Risk Heart Disease: 17 % (01/20/2009)  Labs Reviewed: K+: 4.0 (01/09/2009) Creat: : 1.0 (06/18/2009)   Chol: 169 (01/09/2009)   HDL: 71 (01/09/2009)   LDL: 21 (01/09/2009)   TG: 105 (01/09/2009)  Complete Medication List: 1)  Metformin Hcl 1000 Mg Tabs (Metformin hcl) .... Take 1 two times a day 2)  Glimepiride 2 Mg Tabs (Glimepiride) .Marland Kitchen.. 1 by mouth once daily 3)  Losartan Potassium-hctz 100-25 Mg Tabs (Losartan potassium-hctz) .Marland Kitchen.. 1 by mouth once daily 4)  Aspirin Ec 81 Mg Tbec (Aspirin) .Marland Kitchen.. 1 by mouth once daily  Patient Instructions: 1)  it was good to see you today.  2)  test(s) ordered today - your results will be called to you in 48-72 hours from the time of test completion - if changes need to be made to your medications, we will let you know at that time - will also fax lab results to dr. Idelia Salm - [place] doctor [place])  Please schedule a follow-up appointment in 3-4 months to monitor diabetes and blood pressure, call sooner if problems.    Orders Added: 1)  TLB-A1C / Hgb A1C (Glycohemoglobin) [83036-A1C] 2)  TLB-BMP (Basic Metabolic Panel-BMET) [80048-METABOL] 3)  Est. Patient Level IV [11914]    Contraindications/Deferment of Procedures/Staging:    Test/Procedure: Pneumovax vaccine    Reason for deferment: patient declined     Test/Procedure: FLU VAX    Reason for deferment: patient  declined     Test/Procedure: Zoster vaccine    Reason for deferment: declined   Laboratory Results   Blood Tests     CBG Random:: 182mg /dL

## 2010-03-19 NOTE — Miscellaneous (Signed)
Summary: Orders Update  Clinical Lists Changes  Problems: Added new problem of MILD NONPROLIFERATIVE DIABETIC RETINOPATHY (ICD-362.04) Orders: Added new Test order of Carotid Duplex (Carotid Duplex) - Signed

## 2010-03-30 ENCOUNTER — Other Ambulatory Visit: Payer: Self-pay | Admitting: Internal Medicine

## 2010-03-30 DIAGNOSIS — Z1231 Encounter for screening mammogram for malignant neoplasm of breast: Secondary | ICD-10-CM

## 2010-04-07 ENCOUNTER — Ambulatory Visit: Payer: Self-pay

## 2010-04-08 ENCOUNTER — Ambulatory Visit
Admission: RE | Admit: 2010-04-08 | Discharge: 2010-04-08 | Disposition: A | Discharge status: Home / Self Care | Payer: BC Managed Care – PPO | Source: Ambulatory Visit | Attending: Internal Medicine | Admitting: Internal Medicine

## 2010-04-08 DIAGNOSIS — Z1231 Encounter for screening mammogram for malignant neoplasm of breast: Secondary | ICD-10-CM

## 2010-05-02 LAB — GLUCOSE, CAPILLARY: Glucose-Capillary: 158 mg/dL — ABNORMAL HIGH (ref 70–99)

## 2010-05-20 LAB — COMPREHENSIVE METABOLIC PANEL
AST: 20 U/L (ref 0–37)
Albumin: 3.4 g/dL — ABNORMAL LOW (ref 3.5–5.2)
Alkaline Phosphatase: 57 U/L (ref 39–117)
Chloride: 104 mEq/L (ref 96–112)
GFR calc Af Amer: >60 mL/min (ref >60)
Potassium: 4 mEq/L (ref 3.5–5.1)
Sodium: 140 mEq/L (ref 135–145)
Total Bilirubin: 1.1 mg/dL (ref 0.3–1.2)

## 2010-05-20 LAB — GLUCOSE, CAPILLARY
Glucose-Capillary: 173 mg/dL — ABNORMAL HIGH (ref 70–99)
Glucose-Capillary: 241 mg/dL — ABNORMAL HIGH (ref 70–99)
Glucose-Capillary: 352 mg/dL — ABNORMAL HIGH (ref 70–99)

## 2010-05-20 LAB — CBC
HCT: 34.8 % — ABNORMAL LOW (ref 36.0–46.0)
Hemoglobin: 11.8 g/dL — ABNORMAL LOW (ref 12.0–15.0)
Hemoglobin: 12.6 g/dL (ref 12.0–15.0)
MCHC: 33.9 g/dL (ref 30.0–36.0)
MCHC: 34.6 g/dL (ref 30.0–36.0)
MCV: 92.2 fL (ref 78.0–100.0)
MCV: 94.3 fL (ref 78.0–100.0)
Platelets: 192 10*3/uL (ref 150–400)
RBC: 3.95 MIL/uL (ref 3.87–5.11)
RDW: 14.9 % (ref 11.5–15.5)
RDW: 15.1 % (ref 11.5–15.5)
WBC: 17 10*3/uL — ABNORMAL HIGH (ref 4.0–10.5)

## 2010-05-20 LAB — BASIC METABOLIC PANEL
CO2: 25 mEq/L (ref 19–32)
Calcium: 8.9 mg/dL (ref 8.4–10.5)
Glucose, Bld: 250 mg/dL — ABNORMAL HIGH (ref 70–99)
Sodium: 138 mEq/L (ref 135–145)

## 2010-05-20 LAB — LIPID PANEL
Total CHOL/HDL Ratio: 2.4 RATIO
Triglycerides: 105 mg/dL (ref <150)
VLDL: 21 mg/dL (ref 0–40)

## 2010-05-20 LAB — POCT I-STAT, CHEM 8
Calcium, Ion: 1.16 mmol/L (ref 1.12–1.32)
Glucose, Bld: 245 mg/dL — ABNORMAL HIGH (ref 70–99)
HCT: 39 % (ref 36.0–46.0)
Hemoglobin: 13.3 g/dL (ref 12.0–15.0)
TCO2: 28 mmol/L (ref 0–100)

## 2010-05-20 LAB — DIFFERENTIAL
Basophils Relative: 0 % (ref 0–1)
Eosinophils Absolute: 0.6 10*3/uL (ref 0.0–0.7)
Monocytes Absolute: 0.9 10*3/uL (ref 0.1–1.0)
Monocytes Relative: 10 % (ref 3–12)

## 2010-05-20 LAB — CULTURE, BLOOD (ROUTINE X 2)

## 2010-05-29 ENCOUNTER — Other Ambulatory Visit: Payer: Self-pay | Admitting: Internal Medicine

## 2010-06-19 ENCOUNTER — Other Ambulatory Visit: Payer: Self-pay | Admitting: Internal Medicine

## 2010-06-24 ENCOUNTER — Other Ambulatory Visit (INDEPENDENT_AMBULATORY_CARE_PROVIDER_SITE_OTHER): Payer: Medicare Other

## 2010-06-24 ENCOUNTER — Encounter: Payer: Self-pay | Admitting: Internal Medicine

## 2010-06-24 ENCOUNTER — Other Ambulatory Visit (INDEPENDENT_AMBULATORY_CARE_PROVIDER_SITE_OTHER): Payer: Medicare Other | Admitting: Internal Medicine

## 2010-06-24 ENCOUNTER — Ambulatory Visit (INDEPENDENT_AMBULATORY_CARE_PROVIDER_SITE_OTHER): Payer: Medicare Other | Admitting: Internal Medicine

## 2010-06-24 ENCOUNTER — Telehealth: Payer: Self-pay | Admitting: Internal Medicine

## 2010-06-24 DIAGNOSIS — I10 Essential (primary) hypertension: Secondary | ICD-10-CM

## 2010-06-24 DIAGNOSIS — Z13220 Encounter for screening for lipoid disorders: Secondary | ICD-10-CM

## 2010-06-24 DIAGNOSIS — E119 Type 2 diabetes mellitus without complications: Secondary | ICD-10-CM

## 2010-06-24 DIAGNOSIS — Z79899 Other long term (current) drug therapy: Secondary | ICD-10-CM

## 2010-06-24 DIAGNOSIS — E1169 Type 2 diabetes mellitus with other specified complication: Secondary | ICD-10-CM | POA: Insufficient documentation

## 2010-06-24 DIAGNOSIS — E785 Hyperlipidemia, unspecified: Secondary | ICD-10-CM | POA: Insufficient documentation

## 2010-06-24 LAB — LIPID PANEL
HDL: 60.2 mg/dL (ref >39.00)
Total CHOL/HDL Ratio: 4

## 2010-06-24 LAB — LDL CHOLESTEROL, DIRECT: Direct LDL: 92.2 mg/dL

## 2010-06-24 MED ORDER — SIMVASTATIN 20 MG PO TABS: 20.0000 mg | ORAL_TABLET | Freq: Every evening | ORAL | Status: DC

## 2010-06-24 MED ORDER — GLIMEPIRIDE 2 MG PO TABS: 2.0000 mg | ORAL_TABLET | Freq: Every day | ORAL | Status: DC

## 2010-06-24 MED ORDER — ATENOLOL 25 MG PO TABS: 25.0000 mg | ORAL_TABLET | Freq: Every day | ORAL | Status: DC

## 2010-06-24 NOTE — Progress Notes (Signed)
  Subjective:    Patient ID: Patricia Mccullough, female    DOB: July 25, 1940, 70 y.o.   MRN: [id number]  HPI  here for followup -   DM2 - CBGs running 80-140s - checks sugars between 1-3 pm few time per week - reports usually compliant (80%) with meds and less compliant with diet control - no symptoms hypoglycemia - s/p prior victrectomy right eye, no pain or vision loss - glimepiride added to metformin 06/2009 d/t uncontrolled cbgs  HTN - reports episodic compliance with ongoing medical treatment and no new changes in medication dose or frequency. denies adverse side effects related to current therapy.  on losartan in place of norvasc since 10/2009  -   allergic urticaria - no further reactions since 01/2009 - no itch or hives  Past Medical History  Diagnosis Date  . Diabetes mellitus, type 2   . Hypertension   . Hyperlipidemia      Review of Systems  Constitutional: Negative for fatigue and unexpected weight change.  Respiratory: Negative for cough and shortness of breath.   Cardiovascular: Negative for chest pain.  Neurological: Negative for headaches.       Objective:   Physical Exam BP 144/72  Pulse 79  Temp(Src) 98.2 F (36.8 C) (Oral)  Ht 5' 2.5" (1.588 m)  Wt 169 lb 1.9 oz (76.712 kg)  BMI 30.44 kg/m2  SpO2 98% Physical Exam  Constitutional: She is oriented to person, place, and time. She appears well-developed and well-nourished. No distress.  Neck: Normal range of motion. Neck supple. No JVD present. No thyromegaly present.  Cardiovascular: Normal rate, regular rhythm and normal heart sounds.  No murmur heard. Pulmonary/Chest: Effort normal and breath sounds normal. No respiratory distress. She has no wheezes.  Neurological: She is alert and oriented to person, place, and time. No cranial nerve deficit. Coordination normal.  Psychiatric: She has a normal mood and affect. Her behavior is normal. Judgment and thought content normal.       Lab Results  Component  Value Date   WBC 17.0* 01/09/2009   HGB 12.6 01/09/2009   HCT 37.0 01/09/2009   PLT 192 01/09/2009   CHOL  Value: 169        ATP III CLASSIFICATION:  <200     mg/dL   Desirable  829-562  mg/dL   Borderline High  >=130    mg/dL   High        86/57/8469   TRIG 105 01/09/2009   HDL 71 01/09/2009   ALT <8 01/09/2009   AST 20 01/09/2009   NA 142 02/24/2010   K 4.6 02/24/2010   CL 104 02/24/2010   CREATININE 1.1 02/24/2010   BUN 25* 02/24/2010   CO2 32 02/24/2010   TSH 1.81 01/03/2009   HGBA1C 7.4* 02/24/2010     Assessment & Plan:  See problem list. Medications and labs reviewed today.

## 2010-06-24 NOTE — Patient Instructions (Signed)
It was good to see you today. We have reviewed your prior records including labs and tests today Medications reviewed, will make the following changes at this time: add atenolol to current medication for better blood pressure control Your prescription(s) have been submitted to your pharmacy. Please take as directed and contact our office if you believe you are having problem(s) with the medication(s). Test(s) ordered today. Your results will be called to you after review (48-72hours after test completion). If any changes need to be made, you will be notified at that time. Please schedule followup in 3-4 months, call sooner if problems.

## 2010-06-24 NOTE — Assessment & Plan Note (Signed)
sub optimal control by hx - check a1c today and titrate meds as needed Also check lipids and add low dose statin - cont ASA 81 and ARB Lab Results  Component Value Date   HGBA1C 7.4* 02/24/2010   HGBA1C 7.3* 10/27/2009   HGBA1C 9.1* 06/18/2009   Lab Results  Component Value Date   LDLCALC  Value: 77        Total Cholesterol/HDL:CHD Risk Coronary Heart Disease Risk Table                     Men   Women  1/2 Average Risk   3.4   3.3  Average Risk       5.0   4.4  2 X Average Risk   9.6   7.1  3 X Average Risk  23.4   11.0        Use the calculated Patient Ratio above and the CHD Risk Table to determine the patient's CHD Risk.        ATP III CLASSIFICATION (LDL):  <100     mg/dL   Optimal  161-096  mg/dL   Near or Above                    Optimal  130-159  mg/dL   Borderline  045-409  mg/dL   High  >811     mg/dL   Very High 91/47/8295   CREATININE 1.1 02/24/2010

## 2010-06-24 NOTE — Assessment & Plan Note (Signed)
BP Readings from Last 3 Encounters:  06/24/10 144/72  02/24/10 128/74  10/27/09 162/92   Will add low dose bbloc to current ARB and hctz - ex done recheck 6 weeks, sooner if problems

## 2010-06-24 NOTE — Telephone Encounter (Signed)
Please call patient -  stable diabetes results but high cholesterol - following medication changes recommended as discussed at BJ:YNWGN low dose cholesterol medication - simvastatin 20mg  qhs - erx done - will check again next OV , call sooner if problems- Thanks.

## 2010-06-25 ENCOUNTER — Encounter: Payer: Self-pay | Admitting: Internal Medicine

## 2010-06-25 NOTE — Telephone Encounter (Signed)
Pt notified of results rx already sent to pharmacy.Marland KitchenMarland Kitchen5/10/12@12 :04pm/LMB

## 2010-09-23 ENCOUNTER — Ambulatory Visit: Payer: Medicare Other | Admitting: Internal Medicine

## 2010-09-30 ENCOUNTER — Ambulatory Visit (INDEPENDENT_AMBULATORY_CARE_PROVIDER_SITE_OTHER): Payer: Medicare Other | Admitting: Internal Medicine

## 2010-09-30 ENCOUNTER — Encounter: Payer: Self-pay | Admitting: Internal Medicine

## 2010-09-30 ENCOUNTER — Other Ambulatory Visit (INDEPENDENT_AMBULATORY_CARE_PROVIDER_SITE_OTHER): Payer: Medicare Other

## 2010-09-30 DIAGNOSIS — I10 Essential (primary) hypertension: Secondary | ICD-10-CM

## 2010-09-30 DIAGNOSIS — R5381 Other malaise: Secondary | ICD-10-CM

## 2010-09-30 DIAGNOSIS — E119 Type 2 diabetes mellitus without complications: Secondary | ICD-10-CM

## 2010-09-30 DIAGNOSIS — E785 Hyperlipidemia, unspecified: Secondary | ICD-10-CM

## 2010-09-30 DIAGNOSIS — R5383 Other fatigue: Secondary | ICD-10-CM

## 2010-09-30 LAB — BASIC METABOLIC PANEL
BUN: 28 mg/dL — ABNORMAL HIGH (ref 6–23)
CO2: 28 mEq/L (ref 19–32)
Calcium: 9.3 mg/dL (ref 8.4–10.5)
Creatinine, Ser: 1 mg/dL (ref 0.4–1.2)
GFR: 69.09 mL/min (ref >60.00)
Glucose, Bld: 126 mg/dL — ABNORMAL HIGH (ref 70–99)
Sodium: 142 mEq/L (ref 135–145)

## 2010-09-30 LAB — CBC WITH DIFFERENTIAL/PLATELET
Basophils Absolute: 0 10*3/uL (ref 0.0–0.1)
Eosinophils Absolute: 0.5 10*3/uL (ref 0.0–0.7)
Hemoglobin: 11.9 g/dL — ABNORMAL LOW (ref 12.0–15.0)
Lymphocytes Relative: 40.4 % (ref 12.0–46.0)
MCHC: 33.2 g/dL (ref 30.0–36.0)
Monocytes Relative: 7.7 % (ref 3.0–12.0)
Neutrophils Relative %: 44.7 % (ref 43.0–77.0)
Platelets: 186 10*3/uL (ref 150.0–400.0)
RDW: 15.5 % — ABNORMAL HIGH (ref 11.5–14.6)

## 2010-09-30 LAB — HEPATIC FUNCTION PANEL
ALT: 17 U/L (ref 0–35)
AST: 20 U/L (ref 0–37)
Bilirubin, Direct: 0.1 mg/dL (ref 0.0–0.3)
Total Protein: 7.3 g/dL (ref 6.0–8.3)

## 2010-09-30 LAB — LIPID PANEL
Cholesterol: 111 mg/dL (ref 0–200)
VLDL: 22.2 mg/dL (ref 0.0–40.0)

## 2010-09-30 LAB — TSH: TSH: 1.3 u[IU]/mL (ref 0.35–5.50)

## 2010-09-30 NOTE — Progress Notes (Signed)
  Subjective:    Patient ID: Patricia Mccullough, female    DOB: 1940-04-20, 70 y.o.   MRN: [id number]  HPI  here for followup - reviewed chronic medical issues:  DM2 - CBGs running 80-140s - checks sugars few time per week - reports usually compliant (80%) with meds and less compliant with diet control - no symptoms hypoglycemia - s/p prior victrectomy right eye, no pain or vision loss - glimepiride added to metformin 06/2009 due to uncontrolled cbgs  HTN - reports episodic compliance with ongoing medical treatment and no new changes in medication dose or frequency. denies adverse side effects related to current therapy.  on losartan in place of norvasc since 10/2009  - added hct, then bbloc 06/2010  Dyslipidemia - started simva 06/2010 - the patient reports compliance with medication(s) as prescribed. Denies adverse side effects.  allergic urticaria - no further reactions since 01/2009 - no itch or hives - but complains of sneezing and "congestion" esp in AM for last 2 months - not using any OTC meds - no fever or sore throat   Past Medical History  Diagnosis Date  . Mild nonproliferative diabetic retinopathy   . Hypertension   . Hyperlipidemia   . Diabetes mellitus, type 2   . Allergic urticaria      Review of Systems  Constitutional: Positive for fatigue. Negative for unexpected weight change.  Respiratory: Negative for cough and shortness of breath.   Cardiovascular: Negative for chest pain.  Neurological: Negative for headaches.      Objective:   Physical Exam  BP 140/82  Pulse 62  Temp(Src) 98.1 F (36.7 C) (Oral)  Ht 5' 2.5" (1.588 m)  Wt 171 lb (77.565 kg)  BMI 30.78 kg/m2  SpO2 94%  Constitutional: She is oriented to person, place, and time. She appears well-developed and well-nourished. No distress.  Neck: Normal range of motion. Neck supple. No JVD present. No thyromegaly present.  Cardiovascular: Normal rate, regular rhythm and normal heart sounds.  No murmur  heard. no BLE edema Pulmonary/Chest: Effort normal and breath sounds normal. No respiratory distress. She has no wheezes.  Neurological: She is alert and oriented to person, place, and time. No cranial nerve deficit. Coordination normal.  Psychiatric: She has a normal mood and affect. Her behavior is normal. Judgment and thought content normal.      Lab Results  Component Value Date   WBC 17.0* 01/09/2009   HGB 12.6 01/09/2009   HCT 37.0 01/09/2009   PLT 192 01/09/2009   CHOL 242* 06/24/2010   TRIG 234.0* 06/24/2010   HDL 60.20 06/24/2010   LDLDIRECT 92.2 06/24/2010   ALT <8 01/09/2009   AST 20 01/09/2009   NA 142 02/24/2010   K 4.6 02/24/2010   CL 104 02/24/2010   CREATININE 1.1 02/24/2010   BUN 25* 02/24/2010   CO2 32 02/24/2010   TSH 1.81 01/03/2009   HGBA1C 7.8* 06/24/2010     Assessment & Plan:  See problem list. Medications and labs reviewed today.  Fatigue - nonspecific hx and exam - check labs  Allergic rhinitis - rec OTC claritin in addition to Mucinex

## 2010-09-30 NOTE — Assessment & Plan Note (Signed)
BP Readings from Last 3 Encounters:  09/30/10 140/82  06/24/10 144/72  02/24/10 128/74   continue low dose bbloc with ARB and hctz -

## 2010-09-30 NOTE — Patient Instructions (Addendum)
It was good to see you today. We have reviewed your prior records including labs and tests today -  add daily claritin (10mg  daily) x 2 weeks in addition to mucinex for congestion - if still not improved Medications reviewed, no changes at this time. Test(s) ordered today. Your results will be called to you after review (48-72hours after test completion). If any changes need to be made, you will be notified at that time. Please schedule followup in 3-4 months, call sooner if problems.

## 2010-09-30 NOTE — Assessment & Plan Note (Signed)
sub optimal control by hx - check a1c today and titrate meds as needed cont ASA 81, statin and ARB  Lab Results  Component Value Date   HGBA1C 7.8* 06/24/2010   HGBA1C 7.4* 02/24/2010   HGBA1C 7.3* 10/27/2009   Lab Results  Component Value Date   LDLCALC  Value: 77        Total Cholesterol/HDL:CHD Risk Coronary Heart Disease Risk Table                     Men   Women  1/2 Average Risk   3.4   3.3  Average Risk       5.0   4.4  2 X Average Risk   9.6   7.1  3 X Average Risk  23.4   11.0        Use the calculated Patient Ratio above and the CHD Risk Table to determine the patient's CHD Risk.        ATP III CLASSIFICATION (LDL):  <100     mg/dL   Optimal  782-956  mg/dL   Near or Above                    Optimal  130-159  mg/dL   Borderline  213-086  mg/dL   High  >578     mg/dL   Very High 46/96/2952   CREATININE 1.1 02/24/2010

## 2010-09-30 NOTE — Assessment & Plan Note (Signed)
Started low dose simva 06/2010 - Recheck lipids and LFTs today

## 2010-12-24 ENCOUNTER — Other Ambulatory Visit: Payer: Self-pay | Admitting: Internal Medicine

## 2010-12-30 ENCOUNTER — Encounter: Payer: Self-pay | Admitting: Internal Medicine

## 2010-12-30 ENCOUNTER — Ambulatory Visit (INDEPENDENT_AMBULATORY_CARE_PROVIDER_SITE_OTHER): Payer: Medicare Other | Admitting: Internal Medicine

## 2010-12-30 ENCOUNTER — Other Ambulatory Visit (INDEPENDENT_AMBULATORY_CARE_PROVIDER_SITE_OTHER): Payer: Medicare Other

## 2010-12-30 DIAGNOSIS — E119 Type 2 diabetes mellitus without complications: Secondary | ICD-10-CM

## 2010-12-30 DIAGNOSIS — I10 Essential (primary) hypertension: Secondary | ICD-10-CM

## 2010-12-30 DIAGNOSIS — E785 Hyperlipidemia, unspecified: Secondary | ICD-10-CM

## 2010-12-30 LAB — HEMOGLOBIN A1C: Hgb A1c MFr Bld: 7.5 % — ABNORMAL HIGH (ref 4.6–6.5)

## 2010-12-30 MED ORDER — METFORMIN HCL 1000 MG PO TABS: 1000.0000 mg | ORAL_TABLET | Freq: Two times a day (BID) | ORAL | Status: DC

## 2010-12-30 NOTE — Progress Notes (Signed)
  Subjective:    Patient ID: Patricia Mccullough, female    DOB: 1940/09/21, 70 y.o.   MRN: [id number]  HPI  here for followup - reviewed chronic medical issues:  DM2 - CBGs running 85-140s - checks sugars few time per week - no symptoms hypoglycemia - s/p prior victrectomy right eye (lisa fogelman), no eye pain or vision loss - glimepiride added to metformin 06/2009 due to uncontrolled cbgs - the patient reports compliance with medication(s) as prescribed. Denies adverse side effects.  HTN - reports episodic compliance with ongoing medical treatment and no new changes in medication dose or frequency. denies adverse side effects related to current therapy.  on losartan in place of norvasc since 10/2009  - added hct, then bbloc 06/2010  Dyslipidemia - started simva 06/2010 - the patient reports compliance with medication(s) as prescribed. Denies adverse side effects.  allergic urticaria - no further reactions since 01/2009 - no itch or hives- uses OTC meds as needed - no fever or sore throat   Past Medical History  Diagnosis Date  . Mild nonproliferative diabetic retinopathy   . Hypertension   . Hyperlipidemia   . Diabetes mellitus, type 2   . Allergic urticaria      Review of Systems  Constitutional: Positive for fatigue. Negative for unexpected weight change.  Respiratory: Negative for cough and shortness of breath.   Cardiovascular: Negative for chest pain.  Neurological: Negative for headaches.      Objective:   Physical Exam  BP 122/72  Pulse 73  Temp(Src) 97.9 F (36.6 C) (Oral)  Wt 167 lb 12.8 oz (76.114 kg)  SpO2 99% Wt Readings from Last 3 Encounters:  12/30/10 167 lb 12.8 oz (76.114 kg)  09/30/10 171 lb (77.565 kg)  06/24/10 169 lb 1.9 oz (76.712 kg)   Constitutional: She is overweight, but appears well-developed and well-nourished. No distress.  Neck: Normal range of motion. Neck supple. No JVD present. No thyromegaly present.  Cardiovascular: Normal rate, regular  rhythm and normal heart sounds.  No murmur heard. no BLE edema Pulmonary/Chest: Effort normal and breath sounds normal. No respiratory distress. She has no wheezes.  Psychiatric: She has a normal mood and affect. Her behavior is normal. Judgment and thought content normal.      Lab Results  Component Value Date   WBC 6.8 09/30/2010   HGB 11.9* 09/30/2010   HCT 35.9* 09/30/2010   PLT 186.0 09/30/2010   CHOL 111 09/30/2010   TRIG 111.0 09/30/2010   HDL 61.00 09/30/2010   LDLDIRECT 92.2 06/24/2010   ALT 17 09/30/2010   AST 20 09/30/2010   NA 142 09/30/2010   K 4.0 09/30/2010   CL 106 09/30/2010   CREATININE 1.0 09/30/2010   BUN 28* 09/30/2010   CO2 28 09/30/2010   TSH 1.30 09/30/2010   HGBA1C 8.0* 09/30/2010     Assessment & Plan:  See problem list. Medications and labs reviewed today.

## 2010-12-30 NOTE — Patient Instructions (Signed)
It was good to see you today. Medications reviewed, no changes at this time. Test(s) ordered today. Your results will be called to you after review (48-72hours after test completion). If any changes need to be made, you will be notified at that time. You have been offered the flu shot but declined today - let us know if you change your mind on this Please schedule followup in 4 months for diabetes mellitus check, call sooner if problems.

## 2010-12-30 NOTE — Assessment & Plan Note (Signed)
BP Readings from Last 3 Encounters:  12/30/10 122/72  09/30/10 140/82  06/24/10 144/72   continue low dose bbloc with ARB and hctz -  The current medical regimen is effective;  continue present plan and medications.

## 2010-12-30 NOTE — Assessment & Plan Note (Signed)
Started low dose simva 06/2010 - tolerating well Last lipids reviewed - well controlled The current medical regimen is effective;  continue present plan and medications.

## 2010-12-30 NOTE — Assessment & Plan Note (Signed)
sub optimal control by hx - check a1c today and titrate meds as needed Also continue ASA 81, statin and ARB  Lab Results  Component Value Date   HGBA1C 8.0* 09/30/2010   HGBA1C 7.8* 06/24/2010   HGBA1C 7.4* 02/24/2010

## 2011-03-02 ENCOUNTER — Other Ambulatory Visit: Payer: Self-pay | Admitting: Internal Medicine

## 2011-03-02 DIAGNOSIS — Z1231 Encounter for screening mammogram for malignant neoplasm of breast: Secondary | ICD-10-CM

## 2011-04-13 ENCOUNTER — Ambulatory Visit
Admission: RE | Admit: 2011-04-13 | Discharge: 2011-04-13 | Disposition: A | Discharge status: Home / Self Care | Payer: Medicare Other | Source: Ambulatory Visit | Attending: Internal Medicine | Admitting: Internal Medicine

## 2011-04-13 DIAGNOSIS — Z1231 Encounter for screening mammogram for malignant neoplasm of breast: Secondary | ICD-10-CM

## 2011-05-04 ENCOUNTER — Encounter: Payer: Self-pay | Admitting: Internal Medicine

## 2011-05-04 ENCOUNTER — Ambulatory Visit (INDEPENDENT_AMBULATORY_CARE_PROVIDER_SITE_OTHER): Payer: BC Managed Care – PPO | Admitting: Internal Medicine

## 2011-05-04 ENCOUNTER — Other Ambulatory Visit (INDEPENDENT_AMBULATORY_CARE_PROVIDER_SITE_OTHER): Payer: BC Managed Care – PPO

## 2011-05-04 VITALS — BP 148/80 | HR 65 | Temp 97.7°F | Ht 64.0 in | Wt 166.0 lb

## 2011-05-04 DIAGNOSIS — E785 Hyperlipidemia, unspecified: Secondary | ICD-10-CM

## 2011-05-04 DIAGNOSIS — I10 Essential (primary) hypertension: Secondary | ICD-10-CM

## 2011-05-04 DIAGNOSIS — E119 Type 2 diabetes mellitus without complications: Secondary | ICD-10-CM

## 2011-05-04 DIAGNOSIS — Z0000 Encounter for general adult medical examination without abnormal findings: Secondary | ICD-10-CM

## 2011-05-04 LAB — HEMOGLOBIN A1C: Hgb A1c MFr Bld: 7.4 % — ABNORMAL HIGH (ref 4.6–6.5)

## 2011-05-04 NOTE — Patient Instructions (Signed)
It was good to see you today. Medications reviewed, no changes at this time. Test(s) ordered today. Your results will be called to you after review (48-72hours after test completion). If any changes need to be made, you will be notified at that time. Health Maintenance reviewed - everything is up to date or declined (like immunizations reviewed).  Please schedule followup in 4 months for diabetes mellitus check, call sooner if problems.

## 2011-05-04 NOTE — Progress Notes (Signed)
Subjective:    Patient ID: Patricia Mccullough, female    DOB: 09-25-1940, 71 y.o.   MRN: [id number]  HPI  Here for annual wellness  Diet: heart healthy, diabetic Physical activity: sedentary, but indep Depression/mood screen: negative Hearing: intact to whispered voice Visual acuity: grossly normal, performs annual eye exam  ADLs: capable Fall risk: none Home safety: good Cognitive evaluation: intact to orientation, naming, recall and repetition EOL planning: adv directives, full code/ I agree  I have personally reviewed and have noted 1. The patient's medical and social history 2. Their use of alcohol, tobacco or illicit drugs 3. Their current medications and supplements 4. The patient's functional ability including ADL's, fall risks, home safety risks and hearing or visual impairment. 5. Diet and physical activities 6. Evidence for depression or mood disorders  Also reviewed chronic medical issues:  DM2 - CBGs running 85-140s - checks sugars few time per week - no symptoms hypoglycemia - s/p prior victrectomy right eye (lisa fogelman), no eye pain or vision loss - glimepiride added to metformin 06/2009 due to uncontrolled cbgs - the patient reports compliance with medication(s) as prescribed. Denies adverse side effects.  HTN - reports episodic compliance with ongoing medical treatment and no new changes in medication dose or frequency. denies adverse side effects related to current therapy.  on losartan in place of norvasc since 10/2009  - added hct, then bbloc 06/2010  Dyslipidemia - started simva 06/2010 - the patient reports compliance with medication(s) as prescribed. Denies adverse side effects.  allergic urticaria - no further reactions since 01/2009 - no itch or hives- uses OTC meds as needed - no fever or sore throat   Past Medical History  Diagnosis Date  . Mild nonproliferative diabetic retinopathy   . Hypertension   . Hyperlipidemia   . Diabetes mellitus, type 2     . Allergic urticaria    Family History  Problem Relation Age of Onset  . Breast cancer Mother   . Diabetes Other     parent not sure which 1  . Hypertension Other     Parent not sure which 1  . Alcohol abuse Other    History  Substance Use Topics  . Smoking status: Never Smoker   . Smokeless tobacco: Not on file  . Alcohol Use: No     Review of Systems  Constitutional: Positive for fatigue. Negative for unexpected weight change.  Respiratory: Negative for cough and shortness of breath.   Cardiovascular: Negative for chest pain.  Neurological: Negative for headaches.  Gastrointestinal: Negative for abdominal pain, no bowel changes.  Musculoskeletal: Negative for gait problem or joint swelling.  Skin: Negative for rash.  No other specific complaints in a complete review of systems (except as listed in HPI above).     Objective:   Physical Exam  BP 148/80  Pulse 65  Temp(Src) 97.7 F (36.5 C) (Oral)  Ht 5\' 4"  (1.626 m)  Wt 166 lb (75.297 kg)  BMI 28.49 kg/m2  SpO2 97% Wt Readings from Last 3 Encounters:  05/04/11 166 lb (75.297 kg)  12/30/10 167 lb 12.8 oz (76.114 kg)  09/30/10 171 lb (77.565 kg)   Constitutional: She is overweight, but appears well-developed and well-nourished. No distress.  Neck: Normal range of motion. Neck supple. No JVD present. No thyromegaly present.  Cardiovascular: Normal rate, regular rhythm and normal heart sounds.  No murmur heard. no BLE edema Pulmonary/Chest: Effort normal and breath sounds normal. No respiratory distress. She has no wheezes.  Psychiatric: She has a normal mood and affect. Her behavior is normal. Judgment and thought content normal.      Lab Results  Component Value Date   WBC 6.8 09/30/2010   HGB 11.9* 09/30/2010   HCT 35.9* 09/30/2010   PLT 186.0 09/30/2010   CHOL 111 09/30/2010   TRIG 111.0 09/30/2010   HDL 61.00 09/30/2010   LDLDIRECT 92.2 06/24/2010   ALT 17 09/30/2010   AST 20 09/30/2010   NA 142 09/30/2010   K  4.0 09/30/2010   CL 106 09/30/2010   CREATININE 1.0 09/30/2010   BUN 28* 09/30/2010   CO2 28 09/30/2010   TSH 1.30 09/30/2010   HGBA1C 7.5* 12/30/2010     Assessment & Plan:  AWV/v70.0 - Today patient counseled on age appropriate routine health concerns for screening and prevention, each reviewed and up to date or declined. Immunizations reviewed and up to date or declined. Labs reviewed. Risk factors for depression reviewed and negative. Hearing function and visual acuity are intact. ADLs screened and addressed as needed. Functional ability and level of safety reviewed and appropriate. Education, counseling and referrals performed based on assessed risks today. Patient provided with a copy of personalized plan for preventive services.   Also see problem list. Medications and labs reviewed today.

## 2011-05-04 NOTE — Assessment & Plan Note (Signed)
On metformin + amaryl - improving with weight loss and exercise check a1c today and titrate meds as needed Also continue ASA 81, statin and ARB  Lab Results  Component Value Date   HGBA1C 7.5* 12/30/2010   HGBA1C 8.0* 09/30/2010   HGBA1C 7.8* 06/24/2010

## 2011-05-04 NOTE — Assessment & Plan Note (Signed)
Started low dose simva 06/2010 - tolerating well Last lipids reviewed - well controlled The current medical regimen is effective;  continue present plan and medications.  

## 2011-05-04 NOTE — Assessment & Plan Note (Signed)
BP Readings from Last 3 Encounters:  05/04/11 148/80  12/30/10 122/72  09/30/10 140/82   continue low dose bbloc with ARB and hctz -  The current medical regimen is effective;  continue present plan and medications.

## 2011-06-22 ENCOUNTER — Other Ambulatory Visit: Payer: Self-pay | Admitting: Internal Medicine

## 2011-06-28 ENCOUNTER — Other Ambulatory Visit: Payer: Self-pay | Admitting: Internal Medicine

## 2011-07-02 ENCOUNTER — Encounter: Payer: Self-pay | Admitting: Internal Medicine

## 2011-07-28 ENCOUNTER — Other Ambulatory Visit: Payer: Self-pay | Admitting: Internal Medicine

## 2011-08-31 ENCOUNTER — Encounter: Payer: Self-pay | Admitting: Internal Medicine

## 2011-08-31 ENCOUNTER — Other Ambulatory Visit (INDEPENDENT_AMBULATORY_CARE_PROVIDER_SITE_OTHER): Payer: Medicare Other

## 2011-08-31 ENCOUNTER — Ambulatory Visit (INDEPENDENT_AMBULATORY_CARE_PROVIDER_SITE_OTHER): Payer: Medicare Other | Admitting: Internal Medicine

## 2011-08-31 VITALS — BP 152/84 | HR 63 | Temp 97.6°F | Ht 62.5 in | Wt 171.0 lb

## 2011-08-31 DIAGNOSIS — E119 Type 2 diabetes mellitus without complications: Secondary | ICD-10-CM

## 2011-08-31 DIAGNOSIS — E785 Hyperlipidemia, unspecified: Secondary | ICD-10-CM

## 2011-08-31 DIAGNOSIS — I10 Essential (primary) hypertension: Secondary | ICD-10-CM

## 2011-08-31 LAB — LIPID PANEL
Cholesterol: 158 mg/dL (ref 0–200)
HDL: 67.7 mg/dL (ref >39.00)
Triglycerides: 175 mg/dL — ABNORMAL HIGH (ref 0.0–149.0)
VLDL: 35 mg/dL (ref 0.0–40.0)

## 2011-08-31 LAB — CREATININE, SERUM: Creatinine, Ser: 1.1 mg/dL (ref 0.4–1.2)

## 2011-08-31 NOTE — Assessment & Plan Note (Signed)
On metformin + amaryl - was improving with weight loss and exercise reminded of importance of lifestyle control Check labs today and titrate meds as needed Also continue ASA 81, statin and ARB  Lab Results  Component Value Date   HGBA1C 7.4* 05/04/2011   HGBA1C 7.5* 12/30/2010   HGBA1C 8.0* 09/30/2010

## 2011-08-31 NOTE — Assessment & Plan Note (Signed)
BP Readings from Last 3 Encounters:  08/31/11 152/84  05/04/11 148/80  12/30/10 122/72  subop control today - but will continue low dose bbloc with ARB and hctz -  No med change recommended today but The patient is asked to make an attempt to improve diet and exercise patterns to aid in medical management of this problem.

## 2011-08-31 NOTE — Patient Instructions (Signed)
It was good to see you today. Test(s) ordered today. Your results will be called to you after review (48-72hours after test completion). If any changes need to be made, you will be notified at that time. Medications reviewed, no changes at this time. Work on lifestyle changes as discussed (low fat, low carb, increased protein diet; improved exercise efforts; weight loss) to control sugar, blood pressure and cholesterol levels and/or reduce risk of developing other medical problems. Look into [email or web address] or other type of food journal to assist you in this process. Please schedule followup in 3-4 months for diabetes mellitus and weight check, call sooner if problems.

## 2011-08-31 NOTE — Progress Notes (Signed)
  Subjective:    Patient ID: Patricia Mccullough, female    DOB: 04-02-1940, 71 y.o.   MRN: [id number]  HPI  here for followup - reviewed chronic medical issues:  DM2 - CBGs running 85-140s - checks sugars few time per week - no symptoms hypoglycemia - s/p prior victrectomy right eye (lisa fogelman), no eye pain or vision loss - glimepiride added to metformin 06/2009 due to uncontrolled cbgs - the patient reports compliance with medication(s) as prescribed. Denies adverse side effects.  HTN - reports episodic compliance with ongoing medical treatment and no new changes in medication dose or frequency. denies adverse side effects related to current therapy.  on losartan in place of norvasc since 10/2009  - added hct, then bbloc 06/2010  Dyslipidemia - started simva 06/2010 - the patient reports compliance with medication(s) as prescribed. Denies adverse side effects.  allergic urticaria - no further reactions since 01/2009 - no itch or hives- uses OTC meds as needed - no fever or sore throat   Past Medical History  Diagnosis Date  . Mild nonproliferative diabetic retinopathy   . Hypertension   . Hyperlipidemia   . Diabetes mellitus, type 2   . Allergic urticaria      Review of Systems  Constitutional: Positive for fatigue. Negative for unexpected weight change.  Respiratory: Negative for cough and shortness of breath.   Cardiovascular: Negative for chest pain.  Neurological: Negative for headaches.      Objective:   Physical Exam  BP 152/84  Pulse 63  Temp 97.6 F (36.4 C) (Oral)  Ht 5' 2.5" (1.588 m)  Wt 171 lb (77.565 kg)  BMI 30.78 kg/m2  SpO2 96% Wt Readings from Last 3 Encounters:  08/31/11 171 lb (77.565 kg)  05/04/11 166 lb (75.297 kg)  12/30/10 167 lb 12.8 oz (76.114 kg)   Constitutional: She is overweight, but appears well-developed and well-nourished. No distress.  Neck: Normal range of motion. Neck supple. No JVD present. No thyromegaly present.  Cardiovascular:  Normal rate, regular rhythm and normal heart sounds.  No murmur heard. no BLE edema Pulmonary/Chest: Effort normal and breath sounds normal. No respiratory distress. She has no wheezes.  Psychiatric: She has a normal mood and affect. Her behavior is normal. Judgment and thought content normal.      Lab Results  Component Value Date   WBC 6.8 09/30/2010   HGB 11.9* 09/30/2010   HCT 35.9* 09/30/2010   PLT 186.0 09/30/2010   CHOL 111 09/30/2010   TRIG 111.0 09/30/2010   HDL 61.00 09/30/2010   LDLDIRECT 92.2 06/24/2010   ALT 17 09/30/2010   AST 20 09/30/2010   NA 142 09/30/2010   K 4.0 09/30/2010   CL 106 09/30/2010   CREATININE 1.0 09/30/2010   BUN 28* 09/30/2010   CO2 28 09/30/2010   TSH 1.30 09/30/2010   HGBA1C 7.4* 05/04/2011     Assessment & Plan:  See problem list. Medications and labs reviewed today.

## 2011-08-31 NOTE — Assessment & Plan Note (Signed)
Started low dose simva 06/2010 - tolerating well Last lipids reviewed - check annually The current medical regimen is effective;  continue present plan and medications.  

## 2011-09-01 ENCOUNTER — Other Ambulatory Visit: Payer: Self-pay | Admitting: Internal Medicine

## 2011-09-01 MED ORDER — PIOGLITAZONE HCL 30 MG PO TABS: 30.0000 mg | ORAL_TABLET | Freq: Every day | ORAL | Status: DC

## 2011-09-01 NOTE — Addendum Note (Signed)
Addended by: Rene Paci A on: 09/01/2011 08:13 AM   Modules accepted: Orders

## 2011-09-01 NOTE — Addendum Note (Signed)
Addended by: Rene Paci A on: 09/01/2011 12:49 PM   Modules accepted: Orders

## 2011-12-23 ENCOUNTER — Other Ambulatory Visit: Payer: Self-pay | Admitting: Internal Medicine

## 2011-12-23 MED ORDER — GLIMEPIRIDE 2 MG PO TABS: 2.0000 mg | ORAL_TABLET | Freq: Every day | ORAL | Status: DC

## 2011-12-23 NOTE — Addendum Note (Signed)
Addended by: Deatra James on: 12/23/2011 12:20 PM   Modules accepted: Orders

## 2011-12-28 ENCOUNTER — Encounter: Payer: Self-pay | Admitting: Internal Medicine

## 2011-12-28 ENCOUNTER — Ambulatory Visit (INDEPENDENT_AMBULATORY_CARE_PROVIDER_SITE_OTHER): Payer: Medicare Other | Admitting: Internal Medicine

## 2011-12-28 VITALS — BP 152/90 | HR 70 | Temp 98.8°F | Ht 64.0 in | Wt 168.8 lb

## 2011-12-28 DIAGNOSIS — N63 Unspecified lump in unspecified breast: Secondary | ICD-10-CM

## 2011-12-28 DIAGNOSIS — I10 Essential (primary) hypertension: Secondary | ICD-10-CM

## 2011-12-28 DIAGNOSIS — E785 Hyperlipidemia, unspecified: Secondary | ICD-10-CM

## 2011-12-28 DIAGNOSIS — N6320 Unspecified lump in the left breast, unspecified quadrant: Secondary | ICD-10-CM

## 2011-12-28 DIAGNOSIS — E119 Type 2 diabetes mellitus without complications: Secondary | ICD-10-CM

## 2011-12-28 DIAGNOSIS — Z23 Encounter for immunization: Secondary | ICD-10-CM

## 2011-12-28 MED ORDER — AMLODIPINE BESYLATE 5 MG PO TABS: 5.0000 mg | ORAL_TABLET | Freq: Every day | ORAL | Status: DC

## 2011-12-28 NOTE — Assessment & Plan Note (Signed)
Started low dose simva 06/2010 - tolerating well Last lipids reviewed - check annually The current medical regimen is effective;  continue present plan and medications.  

## 2011-12-28 NOTE — Patient Instructions (Signed)
It was good to see you today. We have reviewed your prior records including labs and tests today Test(s) ordered today. Your results will be released to MyChart (or called to you) after review, usually within 72hours after test completion. If any changes need to be made, you will be notified at that same time. we'll make referral for breast imaging on left side to evaluate the lump . Our office will contact you regarding appointment(s) once made. Stop atenolol - start amlodipine for blood pressure - Other Medications reviewed, no additional changes at this time. Flu shot and pneumonia shot given today

## 2011-12-28 NOTE — Progress Notes (Signed)
  Subjective:    Patient ID: Patricia Mccullough, female    DOB: 04-Apr-1940, 71 y.o.   MRN: [id number]  HPI  here for followup - reviewed chronic medical issues:  DM2 - CBGs running 85-140s - checks sugars few time per week - no symptoms hypoglycemia - s/p prior victrectomy right eye (lisa fogelman), no eye pain or vision loss - glimepiride added to metformin 06/2009 due to uncontrolled cbgs - the patient reports compliance with medication(s) as prescribed. Denies adverse side effects.  HTN - reports episodic compliance with ongoing medical treatment and no new changes in medication dose or frequency. denies adverse side effects related to current therapy.  on losartan in place of norvasc since 10/2009  - added hct, then bbloc 06/2010  Dyslipidemia - started simva 06/2010 - the patient reports compliance with medication(s) as prescribed. Denies adverse side effects.  allergic urticaria - no further reactions since 01/2009 - no itch or hives- uses OTC meds as needed - no fever or sore throat   complains of L breast mass x 2 weeks, ?bra trauma  Past Medical History  Diagnosis Date  . 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 Hypertension   . Hyperlipidemia   . Diabetes mellitus, type 2   . Allergic urticaria      Review of Systems  Constitutional: Positive for fatigue. Negative for unexpected weight change.  Respiratory: Negative for cough and shortness of breath.   Cardiovascular: Negative for chest pain.  Neurological: Negative for headaches.      Objective:   Physical Exam  BP 152/90  Pulse 70  Temp 98.8 F (37.1 C) (Oral)  Ht 5\' 4"  (1.626 m)  Wt 168 lb 12.8 oz (76.567 kg)  BMI 28.97 kg/m2  SpO2 97% Wt Readings from Last 3 Encounters:  12/28/11 168 lb 12.8 oz (76.567 kg)  08/31/11 171 lb (77.565 kg)  05/04/11 166 lb (75.297 kg)   Constitutional: She is overweight, but appears well-developed and well-nourished. No distress.  L breast - 1 cm nodule 1/2" from  [place], mobile and tender at 3 o'clock position Neck: Normal range of motion. Neck supple. No JVD present. No thyromegaly present.  Cardiovascular: Normal rate, regular rhythm and normal heart sounds.  No murmur heard. no BLE edema Pulmonary/Chest: Effort normal and breath sounds normal. No respiratory distress. She has no wheezes.  Psychiatric: She has a normal mood and affect. Her behavior is normal. Judgment and thought content normal.      Lab Results  Component Value Date   WBC 6.8 09/30/2010   HGB 11.9* 09/30/2010   HCT 35.9* 09/30/2010   PLT 186.0 09/30/2010   CHOL 158 08/31/2011   TRIG 175.0* 08/31/2011   HDL 67.70 08/31/2011   LDLDIRECT 92.2 06/24/2010   ALT 17 09/30/2010   AST 20 09/30/2010   NA 142 09/30/2010   K 4.0 09/30/2010   CL 106 09/30/2010   CREATININE 1.1 08/31/2011   BUN 28* 09/30/2010   CO2 28 09/30/2010   TSH 1.30 09/30/2010   HGBA1C 7.9* 08/31/2011     Assessment & Plan:  See problem list. Medications and labs reviewed today.  L breast mass - normal screen mammo 03/2011 reviewed -  possible hematoma following ?bra misfit 2 weeks ago but no bruising on exam -  refer for dx mammo/imaging

## 2011-12-28 NOTE — Assessment & Plan Note (Signed)
On metformin + amaryl - working on weight loss thru diet and exercise reminded of importance of lifestyle control Check labs today and titrate meds as needed Also continue ASA 81, statin and ARB Follows with optho annually and podiatry  Lab Results  Component Value Date   HGBA1C 7.9* 08/31/2011   HGBA1C 7.4* 05/04/2011   HGBA1C 7.5* 12/30/2010

## 2011-12-28 NOTE — Assessment & Plan Note (Signed)
BP Readings from Last 3 Encounters:  12/28/11 152/90  08/31/11 152/84  05/04/11 148/80  subop control today -  continue ARB and hctz -  Change low dose atenolol to amlodipine 5mg 

## 2011-12-29 ENCOUNTER — Other Ambulatory Visit: Payer: Self-pay | Admitting: Internal Medicine

## 2011-12-29 ENCOUNTER — Other Ambulatory Visit (INDEPENDENT_AMBULATORY_CARE_PROVIDER_SITE_OTHER): Payer: Medicare Other

## 2011-12-29 DIAGNOSIS — E119 Type 2 diabetes mellitus without complications: Secondary | ICD-10-CM

## 2011-12-29 DIAGNOSIS — N6320 Unspecified lump in the left breast, unspecified quadrant: Secondary | ICD-10-CM

## 2012-01-05 ENCOUNTER — Ambulatory Visit
Admission: RE | Admit: 2012-01-05 | Discharge: 2012-01-05 | Disposition: A | Discharge status: Home / Self Care | Payer: Medicare Other | Source: Ambulatory Visit | Attending: Internal Medicine | Admitting: Internal Medicine

## 2012-01-05 DIAGNOSIS — N6320 Unspecified lump in the left breast, unspecified quadrant: Secondary | ICD-10-CM

## 2012-02-02 ENCOUNTER — Other Ambulatory Visit: Payer: Self-pay | Admitting: Internal Medicine

## 2012-02-18 ENCOUNTER — Encounter: Payer: Self-pay | Admitting: Internal Medicine

## 2012-02-18 ENCOUNTER — Ambulatory Visit (INDEPENDENT_AMBULATORY_CARE_PROVIDER_SITE_OTHER): Payer: 59 | Admitting: Internal Medicine

## 2012-02-18 VITALS — BP 148/78 | HR 76 | Temp 97.8°F | Ht 64.0 in | Wt 167.2 lb

## 2012-02-18 DIAGNOSIS — R42 Dizziness and giddiness: Secondary | ICD-10-CM

## 2012-02-18 DIAGNOSIS — I10 Essential (primary) hypertension: Secondary | ICD-10-CM

## 2012-02-18 DIAGNOSIS — H8110 Benign paroxysmal vertigo, unspecified ear: Secondary | ICD-10-CM

## 2012-02-18 DIAGNOSIS — J329 Chronic sinusitis, unspecified: Secondary | ICD-10-CM

## 2012-02-18 MED ORDER — MECLIZINE HCL 25 MG PO TABS: 25.0000 mg | ORAL_TABLET | Freq: Three times a day (TID) | ORAL | Status: DC | PRN

## 2012-02-18 NOTE — Assessment & Plan Note (Signed)
BP Readings from Last 3 Encounters:  02/18/12 148/78  12/28/11 152/90  08/31/11 152/84  subop control today -  continue ARB and hctz -  Changed low dose atenolol to amlodipine 5mg  12/2011 May be exac by short term OTC decongestant use - will watch

## 2012-02-18 NOTE — Progress Notes (Signed)
Subjective:    Patient ID: Patricia Mccullough, female    DOB: [birth year], 73 y.o.   MRN: [id number]  HPI complains of dizziness Onset >1 week ago - aggravated by position change, controlled with being still/lying down No ear pain, weakness or headache - no vision changes but +sinus symptoms   also reviewed chronic medical issues:  DM2 - CBGs running 85-140s - checks sugars few time per week - no symptoms hypoglycemia - s/p prior victrectomy right eye (lisa fogelman), no eye pain or vision loss - glimepiride added to metformin 06/2009 due to uncontrolled cbgs - the patient reports compliance with medication(s) as prescribed. Denies adverse side effects.  HTN - reports episodic compliance with ongoing medical treatment and no new changes in medication dose or frequency. denies adverse side effects related to current therapy.  on losartan in place of norvasc since 10/2009  - added hct, then bbloc 06/2010  Dyslipidemia - started simva 06/2010 - the patient reports compliance with medication(s) as prescribed. Denies adverse side effects.  allergic urticaria hx - no further reactions since 01/2009 - no itch or hives- uses OTC meds as needed - no fever or sore throat    Past Medical History  Diagnosis Date  . 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 Hypertension   . Hyperlipidemia   . Diabetes mellitus, type 2   . Allergic urticaria      Review of Systems  Constitutional: Positive for fatigue. Negative for unexpected weight change.  Respiratory: Negative for cough and shortness of breath.   Cardiovascular: Negative for chest pain.  Neurological: Negative for headaches.      Objective:   Physical Exam  BP 148/78  Pulse 76  Temp 97.8 F (36.6 C) (Oral)  Ht 5\' 4"  (1.626 m)  Wt 167 lb 3.2 oz (75.841 kg)  BMI 28.70 kg/m2  SpO2 99% Wt Readings from Last 3 Encounters:  02/18/12 167 lb 3.2 oz (75.841 kg)  12/28/11 168 lb 12.8 oz (76.567 kg)  08/31/11 171 lb (77.565 kg)     Constitutional: She is overweight, but appears well-developed and well-nourished. No distress.  HENT: Head: Normocephalic and atraumatic. Ears: B TMs ok, no erythema or effusion; Nose: Nose normal. Mouth/Throat: Oropharynx is clear and moist. No oropharyngeal exudate.  Eyes: Conjunctivae and EOM are normal. Pupils are equal, round, and reactive to light. No scleral icterus.  Neck: Normal range of motion. Neck supple. No JVD present. No thyromegaly present.  Cardiovascular: Normal rate, regular rhythm and normal heart sounds.  No murmur heard. No BLE edema. Pulmonary/Chest: Effort normal and breath sounds normal. No respiratory distress. She has no wheezes.  Neurological: She is alert and oriented to person, place, and time. No cranial nerve deficit. Coordination normal.  Psychiatric: She has a normal mood and affect. Her behavior is normal. Judgment and thought content normal.        Lab Results  Component Value Date   WBC 6.8 09/30/2010   HGB 11.9* 09/30/2010   HCT 35.9* 09/30/2010   PLT 186.0 09/30/2010   CHOL 158 08/31/2011   TRIG 175.0* 08/31/2011   HDL 67.70 08/31/2011   LDLDIRECT 92.2 06/24/2010   ALT 17 09/30/2010   AST 20 09/30/2010   NA 142 09/30/2010   K 4.0 09/30/2010   CL 106 09/30/2010   CREATININE 1.1 08/31/2011   BUN 28* 09/30/2010   CO2 28 09/30/2010   TSH 1.30 09/30/2010   HGBA1C 7.5* 12/29/2011     Assessment & Plan:  Dizzy  symptoms - BPPV by hx - may be exacerbated by seasonal sinus congestion Neuro exam benign rx meclizine prn and continue OTC decongestant as needed Pt will call if symptoms worse or unimproved   Also see problem list. Medications and labs reviewed today.

## 2012-02-18 NOTE — Patient Instructions (Addendum)
It was good to see you today. Use meclizine tab 3x/day for next 24h, then as needed for dizziness symptoms - Your prescription(s) have been submitted to your pharmacy. Please take as directed and contact our office if you believe you are having problem(s) with the medication(s). Continue your sinus medication as discussed for sinus symptoms Call if symptoms worse or unimproved with these medicines  Regarding your planned travel to [place], please call the Health Department for any special vaccination needs - I have provided CDC recommendations on this for you to review  Dizziness Dizziness is a common problem. It is a feeling of unsteadiness or lightheadedness. You may feel like you are about to faint. Dizziness can lead to injury if you stumble or fall. A person of any age group can suffer from dizziness, but dizziness is more common in older adults. CAUSES   Dizziness can be caused by many different things, including:  Middle ear problems.   Standing for too long.   Infections.   An allergic reaction.   Aging.   An emotional response to something, such as the sight of blood.   Side effects of medicines.   Fatigue.   Problems with circulation or blood pressure.   Excess use of alcohol, medicines, or illegal drug use.   Breathing too fast (hyperventilation).   An arrhythmia or problems with your heart rhythm.   Low red blood cell count (anemia).   Pregnancy.   Vomiting, diarrhea, fever, or other illnesses that cause dehydration.   Diseases or conditions such as Parkinson's disease, high blood pressure (hypertension), diabetes, and thyroid problems.   Exposure to extreme heat.  DIAGNOSIS   To find the cause of your dizziness, your caregiver may do a physical exam, lab tests, radiologic imaging scans, or an electrocardiography test (ECG).   TREATMENT   Treatment of dizziness depends on the cause of your symptoms and can vary greatly. HOME CARE INSTRUCTIONS    Drink  enough fluids to keep your urine clear or pale yellow. This is especially important in very hot weather. In the elderly, it is also important in cold weather.   If your dizziness is caused by medicines, take them exactly as directed. When taking blood pressure medicines, it is especially important to get up slowly.   Rise slowly from chairs and steady yourself until you feel okay.   In the morning, first sit up on the side of the bed. When this seems okay, stand slowly while holding onto something until you know your balance is fine.   If you need to stand in one place for a long time, be sure to move your legs often. Tighten and relax the muscles in your legs while standing.   If dizziness continues to be a problem, have someone stay with you for a day or two. Do this until you feel you are well enough to stay alone. Have the person call your caregiver if he or she notices changes in you that are concerning.   Do not drive or use heavy machinery if you feel dizzy.   Do not drink alcohol.  SEEK IMMEDIATE MEDICAL CARE IF:    Your dizziness or lightheadedness gets worse.   You feel nauseous or vomit.   You develop problems with talking, walking, weakness, or using your arms, hands, or legs.   You are not thinking clearly or you have difficulty forming sentences. It may take a friend or family member to determine if your thinking is normal.  You develop chest pain, abdominal pain, shortness of breath, or sweating.   Your vision changes.   You notice any bleeding.   You have side effects from medicine that seems to be getting worse rather than better.  MAKE SURE YOU:    Understand these instructions.   Will watch your condition.   Will get help right away if you are not doing well or get worse.  Document Released: 07/28/2000 Document Revised: 04/26/2011 Document Reviewed: 08/21/2010 [facility] Patient Information 2013 [place], [place].

## 2012-02-22 ENCOUNTER — Encounter: Payer: Self-pay | Admitting: Internal Medicine

## 2012-03-13 ENCOUNTER — Other Ambulatory Visit: Payer: Self-pay | Admitting: Internal Medicine

## 2012-03-13 DIAGNOSIS — N63 Unspecified lump in unspecified breast: Secondary | ICD-10-CM

## 2012-04-13 ENCOUNTER — Ambulatory Visit
Admission: RE | Admit: 2012-04-13 | Discharge: 2012-04-13 | Disposition: A | Discharge status: Home / Self Care | Payer: Medicare Other | Source: Ambulatory Visit | Attending: Internal Medicine | Admitting: Internal Medicine

## 2012-04-13 ENCOUNTER — Other Ambulatory Visit: Payer: Self-pay | Admitting: Internal Medicine

## 2012-04-13 DIAGNOSIS — N63 Unspecified lump in unspecified breast: Secondary | ICD-10-CM

## 2012-04-27 ENCOUNTER — Other Ambulatory Visit (INDEPENDENT_AMBULATORY_CARE_PROVIDER_SITE_OTHER): Payer: Medicare Other

## 2012-04-27 ENCOUNTER — Ambulatory Visit (INDEPENDENT_AMBULATORY_CARE_PROVIDER_SITE_OTHER): Payer: Medicare Other | Admitting: Internal Medicine

## 2012-04-27 ENCOUNTER — Encounter: Payer: Self-pay | Admitting: Internal Medicine

## 2012-04-27 VITALS — BP 142/70 | HR 63 | Temp 97.9°F | Wt 168.8 lb

## 2012-04-27 DIAGNOSIS — E1139 Type 2 diabetes mellitus with other diabetic ophthalmic complication: Secondary | ICD-10-CM

## 2012-04-27 DIAGNOSIS — I10 Essential (primary) hypertension: Secondary | ICD-10-CM

## 2012-04-27 DIAGNOSIS — E1165 Type 2 diabetes mellitus with hyperglycemia: Secondary | ICD-10-CM

## 2012-04-27 DIAGNOSIS — E785 Hyperlipidemia, unspecified: Secondary | ICD-10-CM

## 2012-04-27 LAB — BASIC METABOLIC PANEL WITH GFR
BUN: 23 mg/dL (ref 6–23)
CO2: 29 meq/L (ref 19–32)
Calcium: 9.5 mg/dL (ref 8.4–10.5)
Chloride: 103 meq/L (ref 96–112)
Creatinine, Ser: 1.1 mg/dL (ref 0.4–1.2)
GFR: 62.38 mL/min
Glucose, Bld: 144 mg/dL — ABNORMAL HIGH (ref 70–99)
Potassium: 4.1 meq/L (ref 3.5–5.1)
Sodium: 139 meq/L (ref 135–145)

## 2012-04-27 LAB — HEMOGLOBIN A1C: Hgb A1c MFr Bld: 7.5 % — ABNORMAL HIGH (ref 4.6–6.5)

## 2012-04-27 MED ORDER — AMLODIPINE BESYLATE 10 MG PO TABS: 10.0000 mg | ORAL_TABLET | Freq: Every day | ORAL | Status: DC

## 2012-04-27 NOTE — Progress Notes (Signed)
  Subjective:    Patient ID: Patricia Mccullough, female    DOB: [birth year], 72 y.o.   MRN: [id number]  HPI Patient here today for 4 month follow up for type 2 diabetes. Also here for f/u of other chronic medical conditions.   DM- Patient has recently joined gym with her husband. She states her blood sugars are still too high and reports an average from 150's to 160's. She is checking her blood sugar 1x per day. She denies any headaches, increase thirst, or increased urination. She has experienced no hypoglycemic symptoms. She is taking her medications as prescribed and has had no adverse SE.   HTN- Patient's blood pressure today is 142/70. She has been taking her medications and complains of no adverse SE at this time. She has a blood pressure monitor at home but has not been taking her BP. She complains of not HA, CP, or lower leg edema. She has experienced some SOB and attributes this to lack of conditioning and sinus congestion.   Sinusitus-   BPPV- Resolved with use of Mucinex.    Review of Systems     Objective:   Physical Exam        Assessment & Plan:  T2DM  HTN  Sinusitus  BPPV

## 2012-04-27 NOTE — Patient Instructions (Signed)
It was good to see you today. We have reviewed your prior records including labs and tests today Test(s) ordered today. Your results will be released to MyChart (or called to you) after review, usually within 72hours after test completion. If any changes need to be made, you will be notified at that same time. Medications reviewed and updated - increase dose amlodipine to 10mg  daily for blood pressure control, no other changes at this time. Your prescription(s) have been submitted to your pharmacy. Please take as directed and contact our office if you believe you are having problem(s) with the medication(s). Please schedule followup in 4 months for diabetes mellitus and blood pressure check, call sooner if problems.

## 2012-04-27 NOTE — Assessment & Plan Note (Signed)
On metformin + amaryl - working on weight loss thru diet and exercise reminded of importance of lifestyle control Check labs today and titrate meds as needed Also continue ASA 81, statin and ARB Follows with optho annually and podiatry  Lab Results  Component Value Date   HGBA1C 7.5* 12/29/2011   HGBA1C 7.9* 08/31/2011   HGBA1C 7.4* 05/04/2011

## 2012-04-27 NOTE — Assessment & Plan Note (Signed)
Started low dose simva 06/2010 - tolerating well Last lipids reviewed - check annually The current medical regimen is effective;  continue present plan and medications.

## 2012-04-27 NOTE — Progress Notes (Signed)
  Subjective:    Patient ID: Patricia Mccullough, female    DOB: 1940/02/16, 72 y.o.   MRN: [id number]  HPI  here for followup - reviewed chronic medical issues:  DM2 - CBGs running 85-140s - checks sugars few time per week - no symptoms hypoglycemia - s/p prior victrectomy right eye (lisa fogelman), no eye pain or vision loss - glimepiride added to metformin 06/2009 due to uncontrolled cbgs - the patient reports compliance with medication(s) as prescribed. Denies adverse side effects.  HTN - reports episodic compliance with ongoing medical treatment and no new changes in medication dose or frequency. denies adverse side effects related to current therapy.  on losartan in place of norvasc since 10/2009  - added hct, then bbloc 06/2010  Dyslipidemia - started simva 06/2010 - the patient reports compliance with medication(s) as prescribed. Denies adverse side effects.  allergic urticaria - no further reactions since 01/2009 - no itch or hives- uses OTC meds as needed - no fever or sore throat    Past Medical History  Diagnosis Date  . 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 Hypertension   . Hyperlipidemia   . Diabetes mellitus, type 2   . Allergic urticaria      Review of Systems  Constitutional: Positive for fatigue. Negative for unexpected weight change.  Respiratory: Negative for cough and shortness of breath.   Cardiovascular: Negative for chest pain.  Neurological: Negative for headaches.      Objective:   Physical Exam  BP 142/70  Pulse 63  Temp(Src) 97.9 F (36.6 C) (Oral)  Wt 168 lb 12.8 oz (76.567 kg)  BMI 28.96 kg/m2  SpO2 94% Wt Readings from Last 3 Encounters:  04/27/12 168 lb 12.8 oz (76.567 kg)  02/18/12 167 lb 3.2 oz (75.841 kg)  12/28/11 168 lb 12.8 oz (76.567 kg)   Constitutional: She is overweight, but appears well-developed and well-nourished. No distress.  Neck: Normal range of motion. Neck supple. No JVD present. No thyromegaly present.   Cardiovascular: Normal rate, regular rhythm and normal heart sounds.  No murmur heard. no BLE edema Pulmonary/Chest: Effort normal and breath sounds normal. No respiratory distress. She has no wheezes.  Psychiatric: She has a normal mood and affect. Her behavior is normal. Judgment and thought content normal.      Lab Results  Component Value Date   WBC 6.8 09/30/2010   HGB 11.9* 09/30/2010   HCT 35.9* 09/30/2010   PLT 186.0 09/30/2010   CHOL 158 08/31/2011   TRIG 175.0* 08/31/2011   HDL 67.70 08/31/2011   LDLDIRECT 92.2 06/24/2010   ALT 17 09/30/2010   AST 20 09/30/2010   NA 142 09/30/2010   K 4.0 09/30/2010   CL 106 09/30/2010   CREATININE 1.1 08/31/2011   BUN 28* 09/30/2010   CO2 28 09/30/2010   TSH 1.30 09/30/2010   HGBA1C 7.5* 12/29/2011     Assessment & Plan:  See problem list. Medications and labs reviewed today.

## 2012-04-27 NOTE — Assessment & Plan Note (Signed)
BP Readings from Last 3 Encounters:  04/27/12 142/70  02/18/12 148/78  12/28/11 152/90  subop control today -  continue ARB and hctz -  Changed low dose atenolol to amlodipine 5mg  12/2011 May be exac by short term OTC decongestant use - will watch

## 2012-05-19 ENCOUNTER — Other Ambulatory Visit: Payer: Self-pay | Admitting: Internal Medicine

## 2012-06-26 ENCOUNTER — Other Ambulatory Visit: Payer: Self-pay | Admitting: Internal Medicine

## 2012-07-19 ENCOUNTER — Telehealth: Payer: Self-pay | Admitting: *Deleted

## 2012-07-19 NOTE — Telephone Encounter (Signed)
Pt requesting copy of labs from last OV to printed out for her to pickup. Pt informed copy of labs printed, placed upfront in cabinet.

## 2012-08-22 ENCOUNTER — Telehealth: Payer: Self-pay | Admitting: *Deleted

## 2012-08-22 NOTE — Telephone Encounter (Signed)
Pt called states she is having severe congestion; requesting appointment.  PT scheduled 7.9.14 at 11:30.

## 2012-08-23 ENCOUNTER — Encounter: Payer: Self-pay | Admitting: Internal Medicine

## 2012-08-23 ENCOUNTER — Ambulatory Visit (INDEPENDENT_AMBULATORY_CARE_PROVIDER_SITE_OTHER): Payer: Medicare Other | Admitting: Internal Medicine

## 2012-08-23 VITALS — BP 142/62 | HR 80 | Temp 98.3°F | Wt 164.8 lb

## 2012-08-23 DIAGNOSIS — E1139 Type 2 diabetes mellitus with other diabetic ophthalmic complication: Secondary | ICD-10-CM

## 2012-08-23 DIAGNOSIS — I10 Essential (primary) hypertension: Secondary | ICD-10-CM

## 2012-08-23 DIAGNOSIS — J42 Unspecified chronic bronchitis: Secondary | ICD-10-CM

## 2012-08-23 DIAGNOSIS — E1165 Type 2 diabetes mellitus with hyperglycemia: Secondary | ICD-10-CM

## 2012-08-23 MED ORDER — HYDROCODONE-HOMATROPINE 5-1.5 MG/5ML PO SYRP: 5.0000 mL | ORAL_SOLUTION | Freq: Three times a day (TID) | ORAL | Status: DC | PRN

## 2012-08-23 MED ORDER — AZITHROMYCIN 250 MG PO TABS: ORAL_TABLET | ORAL | Status: DC

## 2012-08-23 NOTE — Progress Notes (Signed)
  Subjective:    HPI  complains of cough and cold symptoms  Onset >3 weeks ago, wax/wane symptoms, but worse at this time  Initially associated with rhinorrhea, sneezing, sore throat, mild headache and low grade fever Now productive cough with mild-mod chest congestion, symptoms worse at night No relief with OTC meds Precipitated by sick contacts with g-kids  Past Medical History  Diagnosis Date  . 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 Hypertension   . Hyperlipidemia   . Diabetes mellitus, type 2   . Allergic urticaria     Review of Systems Constitutional: No unexpected weight change Pulmonary: No pleurisy or hemoptysis Cardiovascular: No chest pain or palpitations     Objective:   Physical Exam BP 142/62  Pulse 80  Temp(Src) 98.3 F (36.8 C) (Oral)  Wt 164 lb 12.8 oz (74.753 kg)  BMI 28.27 kg/m2  SpO2 97% GEN: mildly ill appearing and audible head/chest congestion HENT: NCAT, mild sinus tenderness bilaterally, TMs clear without erythema or effusion; nares with clear discharge, oropharynx mild erythema, no exudate Eyes: Vision grossly intact, no conjunctivitis Lungs: scattered bilateral rhonchi, no wheeze, no increased work of breathing Cardiovascular: Regular rate and rhythm, no bilateral edema  Lab Results  Component Value Date   WBC 6.8 09/30/2010   HGB 11.9* 09/30/2010   HCT 35.9* 09/30/2010   PLT 186.0 09/30/2010   GLUCOSE 144* 04/27/2012   CHOL 158 08/31/2011   TRIG 175.0* 08/31/2011   HDL 67.70 08/31/2011   LDLDIRECT 92.2 06/24/2010   LDLCALC 55 08/31/2011   ALT 17 09/30/2010   AST 20 09/30/2010   NA 139 04/27/2012   K 4.1 04/27/2012   CL 103 04/27/2012   CREATININE 1.1 04/27/2012   BUN 23 04/27/2012   CO2 29 04/27/2012   TSH 1.30 09/30/2010   HGBA1C 7.5* 04/27/2012       Assessment & Plan:  Viral URI >progression to acute bronchitis Cough, postnasal drip related to above    Empiric antibiotics prescribed due to symptom duration greater than  7 days and comorbid disease Prescription cough suppression - new prescriptions done Symptomatic care with Tylenol or Advil, hydration and rest -  salt gargle advised as needed

## 2012-08-23 NOTE — Assessment & Plan Note (Signed)
BP Readings from Last 3 Encounters:  08/23/12 142/62  04/27/12 142/70  02/18/12 148/78  subop control today -  continue ARB and hctz -  Changed low dose atenolol to amlodipine 5mg  12/2011 May be exac by short term OTC decongestant use - will watch

## 2012-08-23 NOTE — Patient Instructions (Signed)
It was good to see you today.  Zpak antibiotics and prescription cough syrup - Your prescription(s) have been submitted to your pharmacy. Please take as directed and contact our office if you believe you are having problem(s) with the medication(s).  Alternate between ibuprofen and tylenol for aches, pain and fever symptoms as discussed  Hydrate, rest and call if worse or unimproved 

## 2012-08-23 NOTE — Assessment & Plan Note (Signed)
On metformin + amaryl - working on weight loss thru diet and exercise reminded of importance of lifestyle control - but may be acute exac due to illness/infx Also continue ASA 81, statin and ARB Follows with optho annually and podiatry  Lab Results  Component Value Date   HGBA1C 7.5* 04/27/2012   HGBA1C 7.5* 12/29/2011   HGBA1C 7.9* 08/31/2011

## 2012-08-27 ENCOUNTER — Other Ambulatory Visit: Payer: Self-pay | Admitting: Internal Medicine

## 2012-08-31 ENCOUNTER — Ambulatory Visit (INDEPENDENT_AMBULATORY_CARE_PROVIDER_SITE_OTHER): Payer: Medicare Other | Admitting: Internal Medicine

## 2012-08-31 ENCOUNTER — Encounter: Payer: Self-pay | Admitting: Internal Medicine

## 2012-08-31 ENCOUNTER — Other Ambulatory Visit (INDEPENDENT_AMBULATORY_CARE_PROVIDER_SITE_OTHER): Payer: Medicare Other

## 2012-08-31 VITALS — BP 140/72 | HR 78 | Temp 98.2°F | Wt 162.2 lb

## 2012-08-31 DIAGNOSIS — E1139 Type 2 diabetes mellitus with other diabetic ophthalmic complication: Secondary | ICD-10-CM

## 2012-08-31 DIAGNOSIS — J309 Allergic rhinitis, unspecified: Secondary | ICD-10-CM | POA: Insufficient documentation

## 2012-08-31 DIAGNOSIS — E1165 Type 2 diabetes mellitus with hyperglycemia: Secondary | ICD-10-CM

## 2012-08-31 DIAGNOSIS — E785 Hyperlipidemia, unspecified: Secondary | ICD-10-CM

## 2012-08-31 DIAGNOSIS — I10 Essential (primary) hypertension: Secondary | ICD-10-CM

## 2012-08-31 LAB — BASIC METABOLIC PANEL
CO2: 29 mEq/L (ref 19–32)
Chloride: 100 mEq/L (ref 96–112)
Glucose, Bld: 167 mg/dL — ABNORMAL HIGH (ref 70–99)
Sodium: 136 mEq/L (ref 135–145)

## 2012-08-31 LAB — MICROALBUMIN / CREATININE URINE RATIO
Creatinine,U: 140.4 mg/dL
Microalb Creat Ratio: 0.9 mg/g (ref 0.0–30.0)
Microalb, Ur: 1.3 mg/dL (ref 0.0–1.9)

## 2012-08-31 LAB — LIPID PANEL
Total CHOL/HDL Ratio: 2
Triglycerides: 84 mg/dL (ref 0.0–149.0)

## 2012-08-31 MED ORDER — FLUTICASONE PROPIONATE 50 MCG/ACT NA SUSP: 2.0000 | Freq: Every day | NASAL | Status: DC

## 2012-08-31 MED ORDER — GLIMEPIRIDE 2 MG PO TABS: 2.0000 mg | ORAL_TABLET | Freq: Two times a day (BID) | ORAL | Status: DC

## 2012-08-31 NOTE — Patient Instructions (Signed)
It was good to see you today. We have reviewed your prior records including labs and tests today Test(s) ordered today. Your results will be released to MyChart (or called to you) after review, usually within 72hours after test completion. If any changes need to be made, you will be notified at that same time. Medications reviewed and updated -add nose spray for sinus congestion, no other changes at this time. Your prescription(s) have been submitted to your pharmacy. Please take as directed and contact our office if you believe you are having problem(s) with the medication(s). Please schedule followup in 4 months for diabetes mellitus and blood pressure check, call sooner if problems.

## 2012-08-31 NOTE — Progress Notes (Signed)
  Subjective:    Patient ID: Patricia Mccullough, female    DOB: 06-02-1940, 72 y.o.   MRN: [id number]  HPI  here for followup - reviewed chronic medical issues:  DM2 - CBGs running 85-140s - checks sugars few time per week - no symptoms hypoglycemia - s/p prior victrectomy right eye (lisa fogelman), no eye pain or vision loss - glimepiride added to metformin 06/2009 due to uncontrolled cbgs - the patient reports compliance with medication(s) as prescribed. Denies adverse side effects.  HTN - reports episodic compliance with ongoing medical treatment and no new changes in medication dose or frequency. denies adverse side effects related to current therapy.  on losartan in place of norvasc since 10/2009  - added hct, then bbloc 06/2010  Dyslipidemia - started simva 06/2010 - the patient reports compliance with medication(s) as prescribed. Denies adverse side effects.  allergic urticaria - no further reactions since 01/2009 - no itch or hives- uses OTC meds as needed - no fever or sore throat    Past Medical History  Diagnosis Date  . 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 Hypertension   . Hyperlipidemia   . Diabetes mellitus, type 2   . Allergic urticaria      Review of Systems  Constitutional: Positive for fatigue. Negative for fever and unexpected weight change.  HENT: Positive for congestion and postnasal drip. Negative for rhinorrhea, sneezing and sinus pressure.   Respiratory: Negative for cough and shortness of breath.   Cardiovascular: Negative for chest pain.  Neurological: Negative for headaches.      Objective:   Physical Exam  BP 140/72  Pulse 78  Temp(Src) 98.2 F (36.8 C) (Oral)  Wt 162 lb 3.2 oz (73.573 kg)  BMI 27.83 kg/m2  SpO2 95% Wt Readings from Last 3 Encounters:  08/31/12 162 lb 3.2 oz (73.573 kg)  08/23/12 164 lb 12.8 oz (74.753 kg)  04/27/12 168 lb 12.8 oz (76.567 kg)   Constitutional: She is overweight, but appears well-developed and  well-nourished. No distress.  HENT: NCAT, nontender sinus to palp, nares with swollen turbinates but no polyp or DC Neck: Normal range of motion. Neck supple. No JVD present. No thyromegaly present.  Cardiovascular: Normal rate, regular rhythm and normal heart sounds.  No murmur heard. no BLE edema Pulmonary/Chest: Effort normal and breath sounds normal. No respiratory distress. She has no wheezes.  Psychiatric: She has a normal mood and affect. Her behavior is normal. Judgment and thought content normal.      Lab Results  Component Value Date   WBC 6.8 09/30/2010   HGB 11.9* 09/30/2010   HCT 35.9* 09/30/2010   PLT 186.0 09/30/2010   CHOL 158 08/31/2011   TRIG 175.0* 08/31/2011   HDL 67.70 08/31/2011   LDLDIRECT 92.2 06/24/2010   ALT 17 09/30/2010   AST 20 09/30/2010   NA 139 04/27/2012   K 4.1 04/27/2012   CL 103 04/27/2012   CREATININE 1.1 04/27/2012   BUN 23 04/27/2012   CO2 29 04/27/2012   TSH 1.30 09/30/2010   HGBA1C 7.5* 04/27/2012     Assessment & Plan:  See problem list. Medications and labs reviewed today.

## 2012-08-31 NOTE — Assessment & Plan Note (Signed)
Started low dose simva 06/2010 - tolerating well Last lipids reviewed - check annually The current medical regimen is effective;  continue present plan and medications.

## 2012-08-31 NOTE — Addendum Note (Signed)
Addended by: Rene Paci A on: 08/31/2012 02:39 PM   Modules accepted: Orders

## 2012-08-31 NOTE — Assessment & Plan Note (Signed)
Recent infection improved but continued congestion and PND Add nasal steroid

## 2012-08-31 NOTE — Assessment & Plan Note (Signed)
On metformin + amaryl - working on weight loss thru diet and exercise reminded of importance of lifestyle control -  Also continue ASA 81, statin and ARB Follows with optho annually and podiatry  Lab Results  Component Value Date   HGBA1C 7.5* 04/27/2012   HGBA1C 7.5* 12/29/2011   HGBA1C 7.9* 08/31/2011

## 2012-08-31 NOTE — Assessment & Plan Note (Signed)
BP Readings from Last 3 Encounters:  08/31/12 140/72  08/23/12 142/62  04/27/12 142/70  subop control today -  continue ARB and hctz -  Changed low dose atenolol to amlodipine 5mg  12/2011

## 2012-11-01 ENCOUNTER — Other Ambulatory Visit: Payer: Self-pay | Admitting: Internal Medicine

## 2012-12-17 ENCOUNTER — Other Ambulatory Visit: Payer: Self-pay | Admitting: Internal Medicine

## 2012-12-21 ENCOUNTER — Other Ambulatory Visit: Payer: Self-pay

## 2013-01-02 ENCOUNTER — Ambulatory Visit: Payer: Medicare Other | Admitting: Internal Medicine

## 2013-01-04 ENCOUNTER — Ambulatory Visit (INDEPENDENT_AMBULATORY_CARE_PROVIDER_SITE_OTHER): Payer: Medicare Other | Admitting: Internal Medicine

## 2013-01-04 ENCOUNTER — Encounter: Payer: Self-pay | Admitting: Internal Medicine

## 2013-01-04 ENCOUNTER — Other Ambulatory Visit (INDEPENDENT_AMBULATORY_CARE_PROVIDER_SITE_OTHER): Payer: Medicare Other

## 2013-01-04 VITALS — BP 132/72 | HR 72 | Temp 98.1°F | Wt 164.4 lb

## 2013-01-04 DIAGNOSIS — Z23 Encounter for immunization: Secondary | ICD-10-CM

## 2013-01-04 DIAGNOSIS — I10 Essential (primary) hypertension: Secondary | ICD-10-CM

## 2013-01-04 DIAGNOSIS — E785 Hyperlipidemia, unspecified: Secondary | ICD-10-CM

## 2013-01-04 DIAGNOSIS — E1139 Type 2 diabetes mellitus with other diabetic ophthalmic complication: Secondary | ICD-10-CM

## 2013-01-04 NOTE — Progress Notes (Signed)
Pre-visit discussion using our clinic review tool. No additional management support is needed unless otherwise documented below in the visit note.  

## 2013-01-04 NOTE — Assessment & Plan Note (Signed)
On metformin + amaryl - home cbgs <150 per pt report Reports working on weight loss thru diet and exercise reminded of importance of lifestyle control -  Also continue ASA 81, statin and ARB Follows with optho annually and podiatry  Lab Results  Component Value Date   HGBA1C 7.9* 08/31/2012   HGBA1C 7.5* 04/27/2012   HGBA1C 7.5* 12/29/2011

## 2013-01-04 NOTE — Assessment & Plan Note (Signed)
BP Readings from Last 3 Encounters:  01/04/13 132/72  08/31/12 140/72  08/23/12 142/62   continue ARB and hctz w/ amlodipine -  Changed low dose atenolol to amlodipine 5mg  12/2011 The current medical regimen is effective;  continue present plan and medications.

## 2013-01-04 NOTE — Progress Notes (Signed)
  Subjective:    Patient ID: Patricia Mccullough, female    DOB: 19-Jan-1941, 72 y.o.   MRN: [id number]  HPI here for followup - reviewed chronic medical issues:  DM2 - CBGs running 85-140s - checks sugars few time per week - no symptoms hypoglycemia - s/p prior victrectomy right eye (lisa fogel in [place]), no eye pain or vision loss - glimepiride added to metformin 06/2009 due to uncontrolled cbgs, titration of same summer 2014 - the patient reports compliance with medication(s) as prescribed. Denies adverse side effects.  HTN - reports episodic compliance with ongoing medical treatment and no new changes in medication dose or frequency. denies adverse side effects related to current therapy.  on losartan in place of norvasc since 10/2009  - added hct, then bbloc 06/2010  Dyslipidemia - started simva 06/2010 - the patient reports compliance with medication(s) as prescribed. Denies adverse side effects.  allergic urticaria - no further reactions since 01/2009 - no itch or hives- uses OTC meds as needed - no fever or sore throat    Past Medical History  Diagnosis Date  . 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 Hypertension   . Hyperlipidemia   . Diabetes mellitus, type 2   . Allergic urticaria     Review of Systems  Constitutional: Positive for fatigue. Negative for fever and unexpected weight change.  Respiratory: Negative for cough and shortness of breath.   Cardiovascular: Negative for chest pain and leg swelling.      Objective:   Physical Exam BP 132/72  Pulse 72  Temp(Src) 98.1 F (36.7 C) (Oral)  Wt 164 lb 6.4 oz (74.571 kg)  SpO2 99% Wt Readings from Last 3 Encounters:  01/04/13 164 lb 6.4 oz (74.571 kg)  08/31/12 162 lb 3.2 oz (73.573 kg)  08/23/12 164 lb 12.8 oz (74.753 kg)   Constitutional: She is overweight, but appears well-developed and well-nourished. No distress.  Neck: Normal range of motion. Neck supple. No JVD present. No thyromegaly present.   Cardiovascular: Normal rate, regular rhythm and normal heart sounds.  No murmur heard. no BLE edema Pulmonary/Chest: Effort normal and breath sounds normal. No respiratory distress. She has no wheezes.  Psychiatric: She has a normal mood and affect. Her behavior is normal. Judgment and thought content normal.      Lab Results  Component Value Date   WBC 6.8 09/30/2010   HGB 11.9* 09/30/2010   HCT 35.9* 09/30/2010   PLT 186.0 09/30/2010   CHOL 99 08/31/2012   TRIG 84.0 08/31/2012   HDL 49.30 08/31/2012   LDLDIRECT 92.2 06/24/2010   ALT 17 09/30/2010   AST 20 09/30/2010   NA 136 08/31/2012   K 4.0 08/31/2012   CL 100 08/31/2012   CREATININE 1.3* 08/31/2012   BUN 23 08/31/2012   CO2 29 08/31/2012   TSH 1.30 09/30/2010   HGBA1C 7.9* 08/31/2012   MICROALBUR 1.3 08/31/2012     Assessment & Plan:  See problem list. Medications and labs reviewed today.

## 2013-01-04 NOTE — Assessment & Plan Note (Signed)
Started low dose simva 06/2010 - tolerating well Last lipids reviewed - check annually The current medical regimen is effective;  continue present plan and medications.

## 2013-01-04 NOTE — Patient Instructions (Addendum)
It was good to see you today.  Your annual flu shot was given and/or updated today.  Test(s) ordered today. Your results will be released to MyChart (or called to you) after review, usually within 72hours after test completion. If any changes need to be made, you will be notified at that same time.  Medications reviewed and updated - no changes at this time.  Please schedule followup in 4-6 months for diabetes mellitus and blood pressure check, call sooner if problems.  Diabetes and Exercise Exercising regularly is important. It is not just about losing weight. It has many health benefits, such as:  Improving your overall fitness, flexibility, and endurance.  Increasing your bone density.  Helping with weight control.  Decreasing your body fat.  Increasing your muscle strength.  Reducing stress and tension.  Improving your overall health. People with diabetes who exercise gain additional benefits because exercise:  Reduces appetite.  Improves the body's use of blood sugar (glucose).  Helps lower or control blood glucose.  Decreases blood pressure.  Helps control blood lipids (such as cholesterol and triglycerides).  Improves the body's use of the hormone insulin by:  Increasing the body's insulin sensitivity.  Reducing the body's insulin needs.  Decreases the risk for heart disease because exercising:  Lowers cholesterol and triglycerides levels.  Increases the levels of good cholesterol (such as high-density lipoproteins [HDL]) in the body.  Lowers blood glucose levels. YOUR ACTIVITY PLAN  Choose an activity that you enjoy and set realistic goals. Your health care provider or diabetes educator can help you make an activity plan that works for you. You can break activities into 2 or 3 sessions throughout the day. Doing so is as good as one long session. Exercise ideas include:  Taking the dog for a walk.  Taking the stairs instead of the elevator.  Dancing to  your favorite song.  Doing your favorite exercise with a friend. RECOMMENDATIONS FOR EXERCISING WITH TYPE 1 OR TYPE 2 DIABETES   Check your blood glucose before exercising. If blood glucose levels are greater than 240 mg/dL, check for urine ketones. Do not exercise if ketones are present.  Avoid injecting insulin into areas of the body that are going to be exercised. For example, avoid injecting insulin into:  The arms when playing tennis.  The legs when jogging.  Keep a record of:  Food intake before and after you exercise.  Expected peak times of insulin action.  Blood glucose levels before and after you exercise.  The type and amount of exercise you have done.  Review your records with your health care provider. Your health care provider will help you to develop guidelines for adjusting food intake and insulin amounts before and after exercising.  If you take insulin or oral hypoglycemic agents, watch for signs and symptoms of hypoglycemia. They include:  Dizziness.  Shaking.  Sweating.  Chills.  Confusion.  Drink plenty of water while you exercise to prevent dehydration or heat stroke. Body water is lost during exercise and must be replaced.  Talk to your health care provider before starting an exercise program to make sure it is safe for you. Remember, almost any type of activity is better than none. Document Released: 04/24/2003 Document Revised: 10/04/2012 Document Reviewed: 07/11/2012 [facility] [employer] Patient Information 2014 [place], [place].

## 2013-02-12 ENCOUNTER — Ambulatory Visit (INDEPENDENT_AMBULATORY_CARE_PROVIDER_SITE_OTHER): Payer: Medicare Other | Admitting: Nurse Practitioner

## 2013-02-12 ENCOUNTER — Encounter: Payer: Self-pay | Admitting: Nurse Practitioner

## 2013-02-12 VITALS — BP 130/54 | HR 88 | Temp 98.0°F | Ht 62.5 in | Wt 171.8 lb

## 2013-02-12 DIAGNOSIS — J019 Acute sinusitis, unspecified: Secondary | ICD-10-CM

## 2013-02-12 MED ORDER — HYDROCODONE-HOMATROPINE 5-1.5 MG/5ML PO SYRP: 5.0000 mL | ORAL_SOLUTION | Freq: Every evening | ORAL | Status: DC | PRN

## 2013-02-12 MED ORDER — AMOXICILLIN-POT CLAVULANATE 875-125 MG PO TABS: 1.0000 | ORAL_TABLET | Freq: Two times a day (BID) | ORAL | Status: DC

## 2013-02-12 NOTE — Progress Notes (Signed)
Pre-visit discussion using our clinic review tool. No additional management support is needed unless otherwise documented below in the visit note.  

## 2013-02-12 NOTE — Patient Instructions (Addendum)
Start antibiotic. Eat yogurt daily to help prevent diarrhea. Start daily sinus rinses (Neilmed Sinus rinse). Use cough syrup for night time. Use benzocaine throat lozenges to soothe throat. Sip fluids to stay hydrated & keep mucous thin. Please call for re-evaluation if you are not improving.  Sinusitis Sinusitis is redness, soreness, and swelling (inflammation) of the paranasal sinuses. Paranasal sinuses are air pockets within the bones of your face (beneath the eyes, the middle of the forehead, or above the eyes). In healthy paranasal sinuses, mucus is able to drain out, and air is able to circulate through them by way of your nose. However, when your paranasal sinuses are inflamed, mucus and air can become trapped. This can allow bacteria and other germs to grow and cause infection. Sinusitis can develop quickly and last only a short time (acute) or continue over a long period (chronic). Sinusitis that lasts for more than 12 weeks is considered chronic.  CAUSES  Causes of sinusitis include:  Allergies.  Structural abnormalities, such as displacement of the cartilage that separates your nostrils (deviated septum), which can decrease the air flow through your nose and sinuses and affect sinus drainage.  Functional abnormalities, such as when the small hairs (cilia) that line your sinuses and help remove mucus do not work properly or are not present. SYMPTOMS  Symptoms of acute and chronic sinusitis are the same. The primary symptoms are pain and pressure around the affected sinuses. Other symptoms include:  Upper toothache.  Earache.  Headache.  Bad breath.  Decreased sense of smell and taste.  A cough, which worsens when you are lying flat.  Fatigue.  Fever.  Thick drainage from your nose, which often is green and may contain pus (purulent).  Swelling and warmth over the affected sinuses. DIAGNOSIS  Your caregiver will perform a physical exam. During the exam, your caregiver  may:  Look in your nose for signs of abnormal growths in your nostrils (nasal polyps).  Tap over the affected sinus to check for signs of infection.  View the inside of your sinuses (endoscopy) with a special imaging device with a light attached (endoscope), which is inserted into your sinuses. If your caregiver suspects that you have chronic sinusitis, one or more of the following tests may be recommended:  Allergy tests.  Nasal culture A sample of mucus is taken from your nose and sent to a lab and screened for bacteria.  Nasal cytology A sample of mucus is taken from your nose and examined by your caregiver to determine if your sinusitis is related to an allergy. TREATMENT  Most cases of acute sinusitis are related to a viral infection and will resolve on their own within 10 days. Sometimes medicines are prescribed to help relieve symptoms (pain medicine, decongestants, nasal steroid sprays, or saline sprays).  However, for sinusitis related to a bacterial infection, your caregiver will prescribe antibiotic medicines. These are medicines that will help kill the bacteria causing the infection.  Rarely, sinusitis is caused by a fungal infection. In theses cases, your caregiver will prescribe antifungal medicine. For some cases of chronic sinusitis, surgery is needed. Generally, these are cases in which sinusitis recurs more than 3 times per year, despite other treatments. HOME CARE INSTRUCTIONS   Drink plenty of water. Water helps thin the mucus so your sinuses can drain more easily.  Use a humidifier.  Inhale steam 3 to 4 times a day (for example, sit in the bathroom with the shower running).  Apply a warm, moist  washcloth to your face 3 to 4 times a day, or as directed by your caregiver.  Use saline nasal sprays to help moisten and clean your sinuses.  Take over-the-counter or prescription medicines for pain, discomfort, or fever only as directed by your caregiver. SEEK IMMEDIATE  MEDICAL CARE IF:  You have increasing pain or severe headaches.  You have nausea, vomiting, or drowsiness.  You have swelling around your face.  You have vision problems.  You have a stiff neck.  You have difficulty breathing. MAKE SURE YOU:   Understand these instructions.  Will watch your condition.  Will get help right away if you are not doing well or get worse. Document Released: 02/01/2005 Document Revised: 04/26/2011 Document Reviewed: 02/16/2011 [facility] Patient Information 2014 [place], [place].

## 2013-02-12 NOTE — Progress Notes (Signed)
   Subjective:    Patient ID: Patricia Mccullough, female    DOB: 16-Dec-1940, 72 y.o.   MRN: [id number]  URI  This is a chronic problem. The current episode started 1 to 4 weeks ago (2 wks). The problem has been waxing and waning. There has been no fever. Associated symptoms include congestion, coughing, headaches, sinus pain and a sore throat. Pertinent negatives include no abdominal pain, chest pain, diarrhea, ear pain, nausea, neck pain, rash, sneezing, swollen glands, vomiting or wheezing. Treatments tried: mucinex, cough drops. The treatment provided no relief.      Review of Systems  Constitutional: Positive for fatigue. Negative for fever, chills, activity change and appetite change.  HENT: Positive for congestion, postnasal drip, sore throat and voice change. Negative for ear pain and sneezing.   Respiratory: Positive for cough. Negative for chest tightness, shortness of breath and wheezing.   Cardiovascular: Negative for chest pain.  Gastrointestinal: Negative for nausea, vomiting, abdominal pain and diarrhea.  Musculoskeletal: Negative for back pain and neck pain.  Skin: Negative for rash.  Neurological: Positive for headaches.       Objective:   Physical Exam  Vitals reviewed. Constitutional: She is oriented to person, place, and time. She appears well-developed and well-nourished. No distress.  HENT:  Head: Normocephalic and atraumatic.  Right Ear: External ear normal.  Left Ear: External ear normal.  Mouth/Throat: Oropharynx is clear and moist. No oropharyngeal exudate.  White discharge bilat nares  Eyes: Conjunctivae are normal. Right eye exhibits no discharge. Left eye exhibits no discharge.  Neck: Normal range of motion. Neck supple. No thyromegaly present.  Cardiovascular: Normal rate, regular rhythm and normal heart sounds.   No murmur heard. Pulmonary/Chest: Effort normal and breath sounds normal. No respiratory distress. She has no wheezes.  Lymphadenopathy:   She has cervical adenopathy (marble sized tonsilar node or salivary gland R side, NT, moveable).  Neurological: She is alert and oriented to person, place, and time.  Skin: Skin is warm and dry.  Psychiatric: She has a normal mood and affect. Her behavior is normal. Thought content normal.          Assessment & Plan:  1. Acute sinusitis See pt instructions. - amoxicillin-clavulanate (AUGMENTIN) 875-125 MG per tablet; Take 1 tablet by mouth 2 (two) times daily.  Dispense: 10 tablet; Refill: 0 - HYDROcodone-homatropine (HYCODAN) 5-1.5 MG/5ML syrup; Take 5 mLs by mouth at bedtime as needed for cough.  Dispense: 120 mL; Refill: 0  2 enlarged tonsilar node or salivary gland R side. Pt states she has discussed w/PCP.

## 2013-02-17 ENCOUNTER — Other Ambulatory Visit: Payer: Self-pay | Admitting: Internal Medicine

## 2013-03-20 ENCOUNTER — Ambulatory Visit
Admission: RE | Admit: 2013-03-20 | Discharge: 2013-03-20 | Disposition: A | Discharge status: Home / Self Care | Payer: Medicare Other | Source: Ambulatory Visit | Attending: Emergency Medicine | Admitting: Emergency Medicine

## 2013-03-20 ENCOUNTER — Encounter: Payer: Self-pay | Admitting: Emergency Medicine

## 2013-03-20 ENCOUNTER — Ambulatory Visit (INDEPENDENT_AMBULATORY_CARE_PROVIDER_SITE_OTHER): Payer: Medicare Other | Admitting: Emergency Medicine

## 2013-03-20 VITALS — BP 120/52 | HR 83 | Temp 97.7°F | Resp 18

## 2013-03-20 DIAGNOSIS — G459 Transient cerebral ischemic attack, unspecified: Secondary | ICD-10-CM

## 2013-03-20 LAB — POCT CBC
GRANULOCYTE PERCENT: 52.4 % (ref 37–80)
HEMATOCRIT: 39 % (ref 37.7–47.9)
Hemoglobin: 11.8 g/dL — AB (ref 12.2–16.2)
LYMPH, POC: 3.2 (ref 0.6–3.4)
MCH: 29.6 pg (ref 27–31.2)
MCHC: 30.3 g/dL — AB (ref 31.8–35.4)
MCV: 97.8 fL — AB (ref 80–97)
MID (CBC): 0.7 (ref 0–0.9)
MPV: 9 fL (ref 0–99.8)
PLATELET COUNT, POC: 215 10*3/uL (ref 142–424)
POC Granulocyte: 4.3 (ref 2–6.9)
POC LYMPH %: 39.1 % (ref 10–50)
POC MID %: 8.5 %M (ref 0–12)
RBC: 3.99 M/uL — AB (ref 4.04–5.48)
RDW, POC: 16.5 %
WBC: 8.2 10*3/uL (ref 4.6–10.2)

## 2013-03-20 LAB — COMPREHENSIVE METABOLIC PANEL
ALBUMIN: 3.9 g/dL (ref 3.5–5.2)
ALK PHOS: 62 U/L (ref 39–117)
ALT: 35 U/L (ref 0–35)
AST: 26 U/L (ref 0–37)
BILIRUBIN TOTAL: 0.4 mg/dL (ref 0.2–1.2)
BUN: 27 mg/dL — ABNORMAL HIGH (ref 6–23)
CO2: 25 mEq/L (ref 19–32)
CREATININE: 1.1 mg/dL (ref 0.50–1.10)
Calcium: 10.1 mg/dL (ref 8.4–10.5)
Chloride: 103 mEq/L (ref 96–112)
GLUCOSE: 218 mg/dL — AB (ref 70–99)
Potassium: 4.4 mEq/L (ref 3.5–5.3)
Sodium: 137 mEq/L (ref 135–145)
Total Protein: 6.9 g/dL (ref 6.0–8.3)

## 2013-03-20 NOTE — Progress Notes (Signed)
[facility] [address] [place] [address] [phone]  Date:  03/20/2013   Name:  Patricia Mccullough   DOB:  [birth year]   MRN:  [id number]  PCP:  Patricia Grant, MD    Chief Complaint: slurred speech   History of Present Illness:  Patricia Mccullough is a 73 y.o. very pleasant female patient who presents with the following:  Speaking this morning on the phone with her daughter and daughter felt her mother had slurred speech.  Patient really doesn't feel it, but she says she feels globally tired and has some apparent left arm weakness.  No chest pain, shortness of breath, nausea or vomiting, facial asymmetry.  No improvement with over the counter medications or other home remedies. Denies other complaint or health concern today.   Patient Active Problem List   Diagnosis Date Noted  . Allergic sinusitis 08/31/2012  . Other and unspecified hyperlipidemia 06/24/2010  . MILD NONPROLIFERATIVE DIABETIC RETINOPATHY 02/04/2010  . ALLERGIC URTICARIA 01/20/2009  . HYPERTENSION 01/03/2009  . 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 Hypertension   . Hyperlipidemia   . Diabetes mellitus, type 2   . Allergic urticaria     Past Surgical History  Procedure Laterality Date  . Cataract extraction  12/2008  . Vetrectomy  2009    History  Substance Use Topics  . Smoking status: Never Smoker   . Smokeless tobacco: Not on file  . Alcohol Use: No    Family History  Problem Relation Age of Onset  . Breast cancer Mother   . Diabetes Other   . Hypertension Other   . Alcohol abuse Other     No Known Allergies  Medication list has been reviewed and updated.  Current Outpatient Prescriptions on File Prior to Visit  Medication Sig Dispense Refill  . amLODipine (NORVASC) 10 MG tablet Take 1  tablet (10 mg total) by mouth daily.  30 tablet  11  . aspirin 81 MG EC tablet Take 81 mg by mouth daily.        Marland Kitchen glimepiride (AMARYL) 2 MG tablet Take 1 tablet (2 mg total) by mouth 2 (two) times daily before a meal.  60 tablet  5  . losartan-hydrochlorothiazide (HYZAAR) 100-25 MG per tablet TAKE ONE TABLET BY MOUTH ONCE DAILY  30 tablet  5  . meclizine (ANTIVERT) 25 MG tablet Take 1 tablet (25 mg total) by mouth 3 (three) times daily as needed for dizziness or nausea.  30 tablet  0  . metFORMIN (GLUCOPHAGE) 1000 MG tablet TAKE ONE TABLET BY MOUTH TWICE DAILY WITH FOOD  60 tablet  5  . simvastatin (ZOCOR) 20 MG tablet TAKE ONE TABLET BY MOUTH EVERY DAY IN THE EVENING  30 tablet  11  . amoxicillin-clavulanate (AUGMENTIN) 875-125 MG per tablet Take 1 tablet by mouth 2 (two) times daily.  10 tablet  0  . HYDROcodone-homatropine (HYCODAN) 5-1.5 MG/5ML syrup Take 5 mLs by mouth at bedtime as needed for cough.  120 mL  0   No current facility-administered medications on file prior to visit.    Review of Systems:  As per HPI, otherwise negative.    Physical Examination: Filed Vitals:   03/20/13 1422  BP: 120/52  Pulse: 83  Temp: 97.7 F (36.5 C)  Resp: 18   There were no vitals filed  for this visit. There is no weight on file to calculate BMI. Ideal Body Weight:    GEN: WDWN, NAD, Non-toxic, A & O x 3 HEENT: Atraumatic, Normocephalic. Neck supple. No masses, No LAD.  No facial assymetry.  PRRERLA  EOMI CN 2-12 intact  Carotid:  No bruit Ears and Nose: No external deformity. CV: RRR, No M/G/R. No JVD. No thrill. No extra heart sounds. PULM: CTA B, no wheezes, crackles, rhonchi. No retractions. No resp. distress. No accessory muscle use. ABD: S, NT, ND, +BS. No rebound. No HSM. EXTR: No c/c/e NEURO Normal gait. Mild weakness in left arm PSYCH: Normally interactive. Conversant. Not depressed or anxious appearing.  Calm demeanor.     Assessment and Plan:  TIA CT Neuro  consult. Continue aspirin Check labs.   Signed,  Ellison Carwin, MD

## 2013-03-27 ENCOUNTER — Ambulatory Visit: Payer: Medicare Other | Admitting: Neurology

## 2013-04-09 ENCOUNTER — Other Ambulatory Visit: Payer: Self-pay

## 2013-04-09 DIAGNOSIS — Z1231 Encounter for screening mammogram for malignant neoplasm of breast: Secondary | ICD-10-CM

## 2013-04-26 ENCOUNTER — Ambulatory Visit
Admission: RE | Admit: 2013-04-26 | Discharge: 2013-04-26 | Disposition: A | Discharge status: Home / Self Care | Payer: Medicare Other | Source: Ambulatory Visit

## 2013-04-26 ENCOUNTER — Ambulatory Visit: Payer: Medicare Other

## 2013-04-26 DIAGNOSIS — Z1231 Encounter for screening mammogram for malignant neoplasm of breast: Secondary | ICD-10-CM

## 2013-05-01 ENCOUNTER — Ambulatory Visit: Payer: Medicare Other | Admitting: Internal Medicine

## 2013-05-03 ENCOUNTER — Ambulatory Visit: Payer: Medicare Other | Admitting: Internal Medicine

## 2013-05-05 ENCOUNTER — Other Ambulatory Visit: Payer: Self-pay | Admitting: Internal Medicine

## 2013-05-08 ENCOUNTER — Encounter: Payer: Self-pay | Admitting: Internal Medicine

## 2013-05-08 ENCOUNTER — Ambulatory Visit (INDEPENDENT_AMBULATORY_CARE_PROVIDER_SITE_OTHER): Payer: Medicare Other | Admitting: Internal Medicine

## 2013-05-08 ENCOUNTER — Other Ambulatory Visit (INDEPENDENT_AMBULATORY_CARE_PROVIDER_SITE_OTHER): Payer: Medicare Other

## 2013-05-08 VITALS — BP 130/72 | HR 73 | Temp 97.8°F | Wt 169.1 lb

## 2013-05-08 DIAGNOSIS — E1139 Type 2 diabetes mellitus with other diabetic ophthalmic complication: Secondary | ICD-10-CM

## 2013-05-08 DIAGNOSIS — E1165 Type 2 diabetes mellitus with hyperglycemia: Secondary | ICD-10-CM

## 2013-05-08 DIAGNOSIS — G459 Transient cerebral ischemic attack, unspecified: Secondary | ICD-10-CM

## 2013-05-08 DIAGNOSIS — Z8673 Personal history of transient ischemic attack (TIA), and cerebral infarction without residual deficits: Secondary | ICD-10-CM | POA: Insufficient documentation

## 2013-05-08 DIAGNOSIS — I10 Essential (primary) hypertension: Secondary | ICD-10-CM

## 2013-05-08 LAB — HEMOGLOBIN A1C: Hgb A1c MFr Bld: 7.5 % — ABNORMAL HIGH (ref 4.6–6.5)

## 2013-05-08 MED ORDER — ASPIRIN EC 325 MG PO TBEC: 325.0000 mg | DELAYED_RELEASE_TABLET | Freq: Every day | ORAL | Status: DC

## 2013-05-08 NOTE — Progress Notes (Signed)
Patricia Mccullough [id number] 05/08/2013   Chief Complaint  Patient presents with  . Follow-up    4 months    Subjective  HPI Comments: Here today for 68mo follow up. Chronic medical issues and interval medical events discussed.  She has been compliant with all prescribed medications with desired response and without adverse reactions.  TIA 03/20/13: Patient reports that her daughter insisted on getting it checked out.  She says that the reason for slurred speech was medication induced dry mouth early in the morning.  She says that the weakness in her L arm is chronic and not new to this recent event. CT head w/o contrast 03/20/13: Mild atrophy and mild chronic microvascular ischemia. No acute abnormality.  Evaluated by Ellison Carwin, MD.  Recommended neuro f/u and continue of ASA 81mg .   DM2 - CBGs running average 140s - checks sugars almost daily - no symptoms hypoglycemia - the patient reports compliance with medication(s) as prescribed. Denies adverse side effects.  Reports a need to become more active.  This has helped to control the sugars and her mood/stress in the past.  She has a "gazelle" at home and a treadmill that needs a new key.  HTN - reports episodic compliance with ongoing medical treatment and no new changes in medication dose or frequency. denies adverse side effects related to current therapy.  on losartan in place of norvasc since 10/2009  - added hct, then bbloc 06/2010  Dyslipidemia  - Compliance with medication as prescribed. Denies adverse side effects.  Hx of Seasonal Allergies / Allergic urticaria - no further reactions since 01/2009 - no itch or hives- uses OTC meds as needed - no fever or sore throat      Past Medical History  Diagnosis Date  . 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 Hypertension   . Hyperlipidemia   . Diabetes mellitus, type 2   . Allergic urticaria     Past Surgical History  Procedure Laterality Date  . Cataract  extraction  12/2008  . Vetrectomy  2009    Family History  Problem Relation Age of Onset  . Breast cancer Mother   . Diabetes Other   . Hypertension Other   . Alcohol abuse Other     History  Substance Use Topics  . Smoking status: Never Smoker   . Smokeless tobacco: Not on file  . Alcohol Use: No    Current Outpatient Prescriptions on File Prior to Visit  Medication Sig Dispense Refill  . amLODipine (NORVASC) 10 MG tablet Take 1 tablet (10 mg total) by mouth daily.  30 tablet  11  . glimepiride (AMARYL) 2 MG tablet TAKE ONE TABLET BY MOUTH TWICE DAILY BEFORE MEALS  60 tablet  0  . losartan-hydrochlorothiazide (HYZAAR) 100-25 MG per tablet TAKE ONE TABLET BY MOUTH ONCE DAILY  30 tablet  5  . meclizine (ANTIVERT) 25 MG tablet Take 1 tablet (25 mg total) by mouth 3 (three) times daily as needed for dizziness or nausea.  30 tablet  0  . metFORMIN (GLUCOPHAGE) 1000 MG tablet TAKE ONE TABLET BY MOUTH TWICE DAILY WITH FOOD  60 tablet  5  . simvastatin (ZOCOR) 20 MG tablet TAKE ONE TABLET BY MOUTH EVERY DAY IN THE EVENING  30 tablet  11   No current facility-administered medications on file prior to visit.    Allergies: No Known Allergies  Review of Systems  Constitutional: Negative for fever, chills and weight loss.  HENT: Negative for hearing  loss.   Respiratory: Negative for cough and shortness of breath.   Cardiovascular: Negative for chest pain and palpitations.  Gastrointestinal: Negative for nausea, vomiting and diarrhea.  Musculoskeletal: Negative for falls and myalgias.  Neurological: Negative for dizziness and headaches.      Objective  Filed Vitals:   05/08/13 1039  BP: 130/72  Pulse: 73  Temp: 97.8 F (36.6 C)  TempSrc: Oral  Weight: 169 lb 1.9 oz (76.712 kg)  SpO2: 95%    Physical Exam  Constitutional: She is oriented to person, place, and time. She appears well-developed and well-nourished. No distress.  HENT:  Head: Normocephalic and atraumatic.    Right Ear: External ear normal.  Left Ear: External ear normal.  Eyes: Conjunctivae are normal. Pupils are equal, round, and reactive to light. No scleral icterus.  Neck: Normal range of motion. Neck supple. No JVD present.  Cardiovascular: Normal rate, regular rhythm and intact distal pulses.   Murmur heard. Respiratory: Effort normal. No respiratory distress. She has no wheezes. She has no rales.  GI: Soft. Bowel sounds are normal. She exhibits no distension and no mass. There is no tenderness. There is no rebound and no guarding.  Genitourinary:  Deferred   Musculoskeletal: Normal range of motion. She exhibits no edema.  Lymphadenopathy:    She has no cervical adenopathy.  Neurological: She is alert and oriented to person, place, and time. No cranial nerve deficit. Coordination normal.  Skin: She is not diaphoretic.  Psychiatric: She has a normal mood and affect. Her behavior is normal. Judgment and thought content normal.    BP Readings from Last 3 Encounters:  05/08/13 130/72  03/20/13 120/52  02/12/13 130/54    Wt Readings from Last 3 Encounters:  05/08/13 169 lb 1.9 oz (76.712 kg)  02/12/13 171 lb 12 oz (77.905 kg)  01/04/13 164 lb 6.4 oz (74.571 kg)    Lab Results  Component Value Date   WBC 8.2 03/20/2013   HGB 11.8* 03/20/2013   HCT 39.0 03/20/2013   PLT 186.0 09/30/2010   GLUCOSE 218* 03/20/2013   CHOL 99 08/31/2012   TRIG 84.0 08/31/2012   HDL 49.30 08/31/2012   LDLDIRECT 92.2 06/24/2010   LDLCALC 33 08/31/2012   ALT 35 03/20/2013   AST 26 03/20/2013   NA 137 03/20/2013   K 4.4 03/20/2013   CL 103 03/20/2013   CREATININE 1.10 03/20/2013   BUN 27* 03/20/2013   CO2 25 03/20/2013   TSH 1.30 09/30/2010   HGBA1C 7.3* 01/04/2013   MICROALBUR 1.3 08/31/2012    Mm Screening Breast Tomo Bilateral  04/27/2013   CLINICAL DATA:  Screening.  EXAM: DIGITAL SCREENING BILATERAL MAMMOGRAM WITH 3D TOMO WITH CAD  COMPARISON:  Previous exam(s).  ACR Breast Density Category c: The breast tissue is  heterogeneously dense, which may obscure small masses.  FINDINGS: There are no findings suspicious for malignancy. Images were processed with CAD.  IMPRESSION: No mammographic evidence of malignancy. A result letter of this screening mammogram will be mailed directly to the patient.  RECOMMENDATION: Screening mammogram in one year. (Code:[id number])  BI-RADS CATEGORY  1: Negative.   Electronically Signed   By: Lovey Newcomer M.D.   On: 04/27/2013 09:24   CT head w/o contrast 03/20/13: Mild atrophy and mild chronic microvascular ischemia. No acute abnormality.   Assessment and Plan  TIA 03/20/13:  Increase ASA to 325 q day until carotid u/s, MRI, and Echo results are known.   DM2 - Check A1C, microalbumin today.  Continue self monitoring and current medications pending results of A1C/microalbumin.  HTN - Compliance with ongoing medical treatment and no new changes in medication dose or frequency.   Dyslipidemia  - Continued compliance with medication as prescribed. Denies adverse side effects.  Seasonal Allergies - Continue OTC meds as needed  Patient was instructed to notify the office if any new symptoms emerged or the current symptoms become worse.   Return in about 3 months (around 08/08/2013) for recheck diabetes and labs.  Wynelle Cleveland   I have personally reviewed this case with PA student. I also personally examined this patient. I agree with history and findings as documented above. I reviewed, discussed and approve of the assessment and plan as listed above. Gwendolyn Grant, MD

## 2013-05-08 NOTE — Assessment & Plan Note (Signed)
BP Readings from Last 3 Encounters:  05/08/13 130/72  03/20/13 120/52  02/12/13 130/54   continue ARB and hctz w/ amlodipine -  Changed low dose atenolol to amlodipine 5mg  12/2011 The current medical regimen is effective;  continue present plan and medications.

## 2013-05-08 NOTE — Assessment & Plan Note (Addendum)
Transient slurred speech February 2015 reviewed Numerous risk factors for TIA/CVA reviewed Evaluation in urgent care for same, recommended neuro consult which does not appear to have occurred Refer for Doppler of carotids, MRI brain and echo now Continue risk factor modification with medication management as ongoing Increase aspirin to 325 mg daily

## 2013-05-08 NOTE — Assessment & Plan Note (Signed)
On metformin + amaryl - home cbgs <150 per pt report Reports working on weight loss thru diet and exercise reminded of importance of lifestyle control -  Also continue ASA 81, statin and ARB Follows with optho annually and podiatry  Lab Results  Component Value Date   HGBA1C 7.3* 01/04/2013   HGBA1C 7.9* 08/31/2012   HGBA1C 7.5* 04/27/2012

## 2013-05-08 NOTE — Progress Notes (Signed)
Pre visit review using our clinic review tool, if applicable. No additional management support is needed unless otherwise documented below in the visit note. 

## 2013-05-08 NOTE — Patient Instructions (Addendum)
It was good to see you today.  We have reviewed your prior records including labs and tests today  Test(s) ordered today. Your results will be released to [facility] (or called to you) after review, usually within 72hours after test completion. If any changes need to be made, you will be notified at that same time.  Medications reviewed and updated Increase aspirin to 325 mg daily ( or 4 baby aspirin every day) -no other changes recommended at this time.  Please schedule followup in 3 months, call sooner if problems.  Transient Ischemic Attack A transient ischemic attack (TIA) is a "warning stroke" that causes stroke-like symptoms. Unlike a stroke, a TIA does not cause permanent damage to the brain. The symptoms of a TIA can happen very fast and do not last long. It is important to know the symptoms of a TIA and what to do. This can help prevent a major stroke or death. CAUSES   A TIA is caused by a temporary blockage in an artery in the brain or neck (carotid artery). The blockage does not allow the brain to get the blood supply it needs and can cause different symptoms. The blockage can be caused by either:  A blood clot.  Fatty buildup (plaque) in a neck or brain artery. RISK FACTORS  High blood pressure (hypertension).  High cholesterol.  Diabetes mellitus.  Heart disease.  The build up of plaque in the blood vessels (peripheral artery disease or atherosclerosis).  The build up of plaque in the blood vessels providing blood and oxygen to the brain (carotid artery stenosis).  An abnormal heart rhythm (atrial fibrillation).  Obesity.  Smoking.  Taking oral contraceptives (especially in combination with smoking).  Physical inactivity.  A diet high in fats, salt (sodium), and calories.  Alcohol use.  Use of illegal drugs (especially cocaine and methamphetamine).  Being female.  Being African American.  Being over the age of 23.  Family history of stroke.  Previous  history of blood clots, stroke, TIA, or heart attack.  Sickle cell disease. SYMPTOMS  TIA symptoms are the same as a stroke but are temporary. These symptoms usually develop suddenly, or may be newly present upon awakening from sleep:  Sudden weakness or numbness of the face, arm, or leg, especially on one side of the body.  Sudden trouble walking or difficulty moving arms or legs.  Sudden confusion.  Sudden personality changes.  Trouble speaking (aphasia) or understanding.  Difficulty swallowing.  Sudden trouble seeing in one or both eyes.  Double vision.  Dizziness.  Loss of balance or coordination.  Sudden severe headache with no known cause.  Trouble reading or writing.  Loss of bowel or bladder control.  Loss of consciousness. DIAGNOSIS  Your caregiver may be able to determine the presence or absence of a TIA based on your symptoms, history, and physical exam. Computed tomography (CT scan) of the brain is usually performed to help identify a TIA. Other tests may be done to diagnose a TIA. These tests may include:  Electrocardiography.  Continuous heart monitoring.  Echocardiography.  Carotid ultrasonography.  Magnetic resonance imaging (MRI).  A scan of the brain circulation.  Blood tests. PREVENTION  The risk of a TIA can be decreased by appropriately treating high blood pressure, high cholesterol, diabetes, heart disease, and obesity and by quitting smoking, limiting alcohol, and staying physically active. TREATMENT  Time is of the essence. Since the symptoms of TIA are the same as a stroke, it is important to  seek treatment within 3 4 hours of the start of symptoms because you may receive a medicine to dissolve the clot (thrombolytic) that cannot be given after that time. Treatment options vary. Treatment options may include rest, oxygen, intravenous (IV) fluids, and medicines to thin the blood (anticoagulants). Medicines and diet may be used to address  diabetes, high blood pressure, and other risk factors. Measures will be taken to prevent short-term and long-term complications, including infection from breathing foreign material into the lungs (aspiration pneumonia), blood clots in the legs, and falls. Treatment options include procedures to either remove plaque in the carotid arteries or dilate carotid arteries that have narrowed due to plaque. Those procedures are:  Carotid endarterectomy.  Carotid angioplasty and stenting. HOME CARE INSTRUCTIONS   Take all medicines prescribed by your caregiver. Follow the directions carefully. Medicines may be used to control risk factors for a stroke. Be sure you understand all your medicine instructions.  You may be told to take aspirin or the anticoagulant warfarin. Warfarin needs to be taken exactly as instructed.  Taking too much or too little warfarin is dangerous. Too much warfarin increases the risk of bleeding. Too little warfarin continues to allow the risk for blood clots. While taking warfarin, you will need to have regular blood tests to measure your blood clotting time. A PT blood test measures how long it takes for blood to clot. Your PT is used to calculate another value called an INR. Your PT and INR help your caregiver to adjust your dose of warfarin. The dose can change for many reasons. It is critically important that you take warfarin exactly as prescribed.  Many foods, especially foods high in vitamin K can interfere with warfarin and affect the PT and INR. Foods high in vitamin K include spinach, kale, broccoli, cabbage, collard and turnip greens, brussels sprouts, peas, cauliflower, seaweed, and parsley as well as beef and pork liver, green tea, and soybean oil. You should eat a consistent amount of foods high in vitamin K. Avoid major changes in your diet, or notify your caregiver before changing your diet. Arrange a visit with a dietitian to answer your questions.  Many medicines can  interfere with warfarin and affect the PT and INR. You must tell your caregiver about any and all medicines you take, this includes all vitamins and supplements. Be especially cautious with aspirin and anti-inflammatory medicines. Do not take or discontinue any prescribed or over-the-counter medicine except on the advice of your caregiver or pharmacist.  Warfarin can have side effects, such as excessive bruising or bleeding. You will need to hold pressure over cuts for longer than usual. Your caregiver or pharmacist will discuss other potential side effects.  Avoid sports or activities that may cause injury or bleeding.  Be mindful when shaving, flossing your teeth, or handling sharp objects.  Alcohol can change the body's ability to handle warfarin. It is best to avoid alcoholic drinks or consume only very small amounts while taking warfarin. Notify your caregiver if you change your alcohol intake.  Notify your dentist or other caregivers before procedures.  Eat a diet that includes 5 or more servings of fruits and vegetables each day. This may reduce the risk of stroke. Certain diets may be prescribed to address high blood pressure, high cholesterol, diabetes, or obesity.  A low-sodium, low-saturated fat, low-trans fat, low-cholesterol diet is recommended to manage high blood pressure.  A low-saturated fat, low-trans fat, low-cholesterol, and high-fiber diet may control cholesterol levels.  A  controlled-carbohydrate, controlled-sugar diet is recommended to manage diabetes.  A reduced-calorie, low-sodium, low-saturated fat, low-trans fat, low-cholesterol diet is recommended to manage obesity.  Maintain a healthy weight.  Stay physically active. It is recommended that you get at least 30 minutes of activity on most or all days.  Do not smoke.  Limit alcohol use even if you are not taking warfarin. Moderate alcohol use is considered to be:  No more than 2 drinks each day for men.  No  more than 1 drink each day for nonpregnant women.  Stop drug abuse.  Home safety. A safe home environment is important to reduce the risk of falls. Your caregiver may arrange for specialists to evaluate your home. Having grab bars in the bedroom and bathroom is often important. Your caregiver may arrange for equipment to be used at home, such as raised toilets and a seat for the shower.  Follow all instructions for follow-up with your caregiver. This is very important. This includes any referrals and lab tests. Proper follow up can prevent a stroke or another TIA from occurring. SEEK MEDICAL CARE IF:  You have personality changes.  You have difficulty swallowing.  You are seeing double.  You have dizziness.  You have a fever.  You have skin breakdown. SEEK IMMEDIATE MEDICAL CARE IF:  Any of these symptoms may represent a serious problem that is an emergency. Do not wait to see if the symptoms will go away. Get medical help right away. Call your local emergency services (911 in [place] Do not drive yourself to the hospital.  You have sudden weakness or numbness of the face, arm, or leg, especially on one side of the body.  You have sudden trouble walking or difficulty moving arms or legs.  You have sudden confusion.  You have trouble speaking (aphasia) or understanding.  You have sudden trouble seeing in one or both eyes.  You have a loss of balance or coordination.  You have a sudden, severe headache with no known cause.  You have new chest pain or an irregular heartbeat.  You have a partial or total loss of consciousness. MAKE SURE YOU:   Understand these instructions.  Will watch your condition.  Will get help right away if you are not doing well or get worse. Document Released: 11/11/2004 Document Revised: 01/19/2012 Document Reviewed: 03/27/2009 [facility] Patient Information 2014 [place].

## 2013-05-08 NOTE — Progress Notes (Signed)
Subjective:    Patient ID: Patricia Mccullough, female    DOB: [birth year], 73 y.o.   MRN: [id number]  HPI  Patient is here for follow up  Reviewed chronic medical issues and interval medical events  Past Medical History  Diagnosis Date  . 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 Hypertension   . Hyperlipidemia   . Diabetes mellitus, type 2   . Allergic urticaria     Review of Systems  Constitutional: Positive for fatigue. Negative for fever and unexpected weight change.  Respiratory: Negative for cough and shortness of breath.   Cardiovascular: Negative for chest pain and leg swelling.  Neurological: Positive for speech difficulty (03/2013 x 30", no recurrent issues). Negative for dizziness, numbness and headaches.       Objective:   Physical Exam  BP 130/72  Pulse 73  Temp(Src) 97.8 F (36.6 C) (Oral)  Wt 169 lb 1.9 oz (76.712 kg)  SpO2 95% Wt Readings from Last 3 Encounters:  05/08/13 169 lb 1.9 oz (76.712 kg)  02/12/13 171 lb 12 oz (77.905 kg)  01/04/13 164 lb 6.4 oz (74.571 kg)    Constitutional: She appears well-developed and well-nourished. No distress. HENT: NCAT, sinus nontender - OP clear Eyes: PERRL, EOMI - vision grossly intact B  Neck: Normal range of motion. Neck supple. No JVD present. No thyromegaly present. no carotid bruits Cardiovascular: Normal rate, regular rhythm and normal heart sounds.  No murmur heard. No BLE edema. Pulmonary/Chest: Effort normal and breath sounds normal. No respiratory distress. She has no wheezes.  Abdomen: SNTND Skin: no rash or ulcers - Neuro: awake, alert, oriented x4. Cranial nerves II through XII symmetrically intact. Speech fluent. Recall intact short-term and 5 minutes. Gait, balance, coordination normal. Psychiatric: She has a normal mood and affect. Her behavior is normal. Judgment and thought content normal.   Lab Results  Component Value Date   WBC 8.2 03/20/2013   HGB 11.8* 03/20/2013   HCT 39.0  03/20/2013   PLT 186.0 09/30/2010   GLUCOSE 218* 03/20/2013   CHOL 99 08/31/2012   TRIG 84.0 08/31/2012   HDL 49.30 08/31/2012   LDLDIRECT 92.2 06/24/2010   LDLCALC 33 08/31/2012   ALT 35 03/20/2013   AST 26 03/20/2013   NA 137 03/20/2013   K 4.4 03/20/2013   CL 103 03/20/2013   CREATININE 1.10 03/20/2013   BUN 27* 03/20/2013   CO2 25 03/20/2013   TSH 1.30 09/30/2010   HGBA1C 7.3* 01/04/2013   MICROALBUR 1.3 08/31/2012    Mm Screening Breast Tomo Bilateral  04/27/2013   CLINICAL DATA:  Screening.  EXAM: DIGITAL SCREENING BILATERAL MAMMOGRAM WITH 3D TOMO WITH CAD  COMPARISON:  Previous exam(s).  ACR Breast Density Category c: The breast tissue is heterogeneously dense, which may obscure small masses.  FINDINGS: There are no findings suspicious for malignancy. Images were processed with CAD.  IMPRESSION: No mammographic evidence of malignancy. A result letter of this screening mammogram will be mailed directly to the patient.  RECOMMENDATION: Screening mammogram in one year. (Code:SM-B-01Y)  BI-RADS CATEGORY  1: Negative.   Electronically Signed   By: Lovey Newcomer M.D.   On: 04/27/2013 09:24       Assessment & Plan:   Problem List Items Addressed This Visit   HYPERTENSION      BP Readings from Last 3 Encounters:  05/08/13 130/72  03/20/13 120/52  02/12/13 130/54   continue ARB and hctz w/ amlodipine -  Changed low dose atenolol to  amlodipine 5mg  12/2011 The current medical regimen is effective;  continue present plan and medications.     Relevant Medications      aspirin EC tablet   Other Relevant Orders      Doppler carotid      MR Brain W Wo Contrast      2D Echocardiogram without contrast   TIA (transient ischemic attack) - Primary     Transient slurred speech February 2015 reviewed Numerous risk factors for TIA/CVA reviewed Evaluation in urgent care for same, recommended neuro consult which does not appear to have occurred Refer for Doppler of carotids, MRI brain and echo now Continue risk  factor modification with medication management as ongoing Increase aspirin to 325 mg daily    Relevant Orders      Doppler carotid      MR Brain W Wo Contrast      2D Echocardiogram without contrast   Type II or unspecified type diabetes mellitus with ophthalmic manifestations, 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)      On metformin + amaryl - home cbgs <150 per pt report Reports working on weight loss thru diet and exercise reminded of importance of lifestyle control -  Also continue ASA 81, statin and ARB Follows with optho annually and podiatry  Lab Results  Component Value Date   HGBA1C 7.3* 01/04/2013   HGBA1C 7.9* 08/31/2012   HGBA1C 7.5* 04/27/2012      Relevant Orders      Hemoglobin A1c      Doppler carotid      MR Brain W Wo Contrast      2D Echocardiogram without contrast     Time spent with pt today 45 minutes, greater than 50% time spent counseling patient on TIA event, risk of future CVA, diabetes, HTN and medication review. Also review of prior records

## 2013-05-11 ENCOUNTER — Encounter: Payer: Self-pay | Admitting: Cardiology

## 2013-05-11 ENCOUNTER — Ambulatory Visit (HOSPITAL_COMMUNITY): Payer: Medicare Other | Attending: Cardiology | Admitting: *Deleted

## 2013-05-11 DIAGNOSIS — E1165 Type 2 diabetes mellitus with hyperglycemia: Principal | ICD-10-CM

## 2013-05-11 DIAGNOSIS — E1139 Type 2 diabetes mellitus with other diabetic ophthalmic complication: Secondary | ICD-10-CM | POA: Insufficient documentation

## 2013-05-11 DIAGNOSIS — I10 Essential (primary) hypertension: Secondary | ICD-10-CM | POA: Insufficient documentation

## 2013-05-11 DIAGNOSIS — I6529 Occlusion and stenosis of unspecified carotid artery: Secondary | ICD-10-CM

## 2013-05-11 DIAGNOSIS — G459 Transient cerebral ischemic attack, unspecified: Secondary | ICD-10-CM

## 2013-05-11 NOTE — Progress Notes (Signed)
Carotid Duplex complete 

## 2013-05-15 ENCOUNTER — Encounter: Payer: Self-pay | Admitting: Internal Medicine

## 2013-05-15 ENCOUNTER — Telehealth: Payer: Self-pay | Admitting: *Deleted

## 2013-05-15 DIAGNOSIS — E041 Nontoxic single thyroid nodule: Secondary | ICD-10-CM

## 2013-05-15 HISTORY — DX: Nontoxic single thyroid nodule: E04.1

## 2013-05-15 NOTE — Telephone Encounter (Signed)
Called pt with results of carotid doppler results. Pt states she has never seen endocrinologist so she will need referral.../lmb

## 2013-05-16 NOTE — Telephone Encounter (Signed)
Thanks for note Refer to endo done thanks

## 2013-05-17 ENCOUNTER — Ambulatory Visit
Admission: RE | Admit: 2013-05-17 | Discharge: 2013-05-17 | Disposition: A | Discharge status: Home / Self Care | Payer: Medicare Other | Source: Ambulatory Visit | Attending: Internal Medicine | Admitting: Internal Medicine

## 2013-05-17 ENCOUNTER — Other Ambulatory Visit: Payer: Self-pay | Admitting: Internal Medicine

## 2013-05-17 DIAGNOSIS — I10 Essential (primary) hypertension: Secondary | ICD-10-CM

## 2013-05-17 DIAGNOSIS — G459 Transient cerebral ischemic attack, unspecified: Secondary | ICD-10-CM

## 2013-05-17 DIAGNOSIS — E1165 Type 2 diabetes mellitus with hyperglycemia: Secondary | ICD-10-CM

## 2013-05-17 DIAGNOSIS — E1139 Type 2 diabetes mellitus with other diabetic ophthalmic complication: Secondary | ICD-10-CM

## 2013-05-21 ENCOUNTER — Telehealth: Payer: Self-pay | Admitting: Internal Medicine

## 2013-05-21 NOTE — Telephone Encounter (Signed)
Relevant patient education assigned to patient using Emmi. ° °

## 2013-05-22 ENCOUNTER — Ambulatory Visit (HOSPITAL_COMMUNITY): Payer: Medicare Other | Attending: Cardiovascular Disease | Admitting: Radiology

## 2013-05-22 DIAGNOSIS — E1139 Type 2 diabetes mellitus with other diabetic ophthalmic complication: Secondary | ICD-10-CM | POA: Insufficient documentation

## 2013-05-22 DIAGNOSIS — G459 Transient cerebral ischemic attack, unspecified: Secondary | ICD-10-CM | POA: Insufficient documentation

## 2013-05-22 DIAGNOSIS — I10 Essential (primary) hypertension: Secondary | ICD-10-CM

## 2013-05-22 DIAGNOSIS — E1165 Type 2 diabetes mellitus with hyperglycemia: Principal | ICD-10-CM

## 2013-05-22 DIAGNOSIS — R011 Cardiac murmur, unspecified: Secondary | ICD-10-CM

## 2013-05-22 NOTE — Progress Notes (Signed)
Echocardiogram Performed. 

## 2013-05-30 ENCOUNTER — Encounter: Payer: Self-pay | Admitting: *Deleted

## 2013-06-04 ENCOUNTER — Telehealth: Payer: Self-pay

## 2013-06-04 NOTE — Telephone Encounter (Signed)
Relevant patient education assigned to patient using Emmi. ° °

## 2013-06-07 ENCOUNTER — Ambulatory Visit: Payer: Medicare Other | Admitting: Endocrinology

## 2013-06-19 ENCOUNTER — Ambulatory Visit: Payer: Medicare Other | Admitting: Endocrinology

## 2013-06-27 ENCOUNTER — Encounter: Payer: Self-pay | Admitting: Endocrinology

## 2013-06-27 ENCOUNTER — Other Ambulatory Visit (INDEPENDENT_AMBULATORY_CARE_PROVIDER_SITE_OTHER): Payer: Medicare Other

## 2013-06-27 ENCOUNTER — Ambulatory Visit (INDEPENDENT_AMBULATORY_CARE_PROVIDER_SITE_OTHER): Payer: Medicare Other | Admitting: Endocrinology

## 2013-06-27 VITALS — BP 122/72 | HR 84 | Temp 98.7°F | Ht 62.5 in | Wt 172.0 lb

## 2013-06-27 DIAGNOSIS — E041 Nontoxic single thyroid nodule: Secondary | ICD-10-CM

## 2013-06-27 LAB — TSH: TSH: 1.22 u[IU]/mL (ref 0.35–4.50)

## 2013-06-27 NOTE — Patient Instructions (Signed)
A thyroid blood test is requested for you today.  We'll contact you with results. If it is normal, I'll request a biopsy for you.

## 2013-06-27 NOTE — Progress Notes (Signed)
Subjective:    Patient ID: Patricia Mccullough, female    DOB: 02-07-1941, 73 y.o.   MRN: [id number]  HPI 1 week ago, on a carotid US, pt was incidentally noted to have a slight nodule at the right thyroid lobe. No assoc neck pain. Past Medical History  Diagnosis Date  . 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 Hypertension   . Hyperlipidemia   . Diabetes mellitus, type 2   . Allergic urticaria   . Thyroid nodule 05/15/2013    Right side, incidental on carotid US 05/11/13    Past Surgical History  Procedure Laterality Date  . Cataract extraction  12/2008  . Vetrectomy  2009    History   Social History  . Marital Status: Married    Spouse Name: N/A    Number of Children: N/A  . Years of Education: N/A   Occupational History  . Not on file.   Social History Main Topics  . Smoking status: Never Smoker   . Smokeless tobacco: Not on file  . Alcohol Use: No  . Drug Use: No  . Sexual Activity:    Other Topics Concern  . Not on file   Social History Narrative   Married, lives with spouse and their grown son. 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 amLODipine (NORVASC) 10 MG tablet TAKE ONE TABLET BY MOUTH ONCE DAILY  30 tablet  5  . aspirin EC 325 MG tablet Take 1 tablet (325 mg total) by mouth daily.  100 tablet  3  . glimepiride (AMARYL) 2 MG tablet TAKE ONE TABLET BY MOUTH TWICE DAILY BEFORE MEALS  60 tablet  0  . losartan-hydrochlorothiazide (HYZAAR) 100-25 MG per tablet TAKE ONE TABLET BY MOUTH ONCE DAILY  30 tablet  5  . metFORMIN (GLUCOPHAGE) 1000 MG tablet TAKE ONE TABLET BY MOUTH TWICE DAILY WITH FOOD  60 tablet  5  . meclizine (ANTIVERT) 25 MG tablet Take 1 tablet (25 mg total) by mouth 3 (three) times daily as needed for dizziness or nausea.  30 tablet  0  .  simvastatin (ZOCOR) 20 MG tablet TAKE ONE TABLET BY MOUTH EVERY DAY IN THE EVENING  30 tablet  11   No current facility-administered medications on file prior to visit.    No Known Allergies  Family History  Problem Relation Age of Onset  . Breast cancer Mother   . Diabetes Other   . Hypertension Other   . Alcohol abuse Other     BP 122/72  Pulse 84  Temp(Src) 98.7 F (37.1 C) (Oral)  Ht 5' 2.5" (1.588 m)  Wt 172 lb (78.019 kg)  BMI 30.94 kg/m2  SpO2 98%  Review of Systems denies weight loss, headache, double vision, palpitations, sob, diarrhea, polyuria, myalgias, excessive diaphoresis, numbness, tremor, anxiety, cold intolerance, easy bruising, and rhinorrhea.  She has intermittent difficulty speaking.  She has slight hoarseness.      Objective:   Physical Exam VS: see vs page GEN: no distress HEAD: head: no deformity eyes: no periorbital swelling, no proptosis external nose and ears are normal mouth: no lesion seen NECK: supple, thyroid is not enlarged.  i do not appreciate the thyroid nodule CHEST WALL: no deformity LUNGS:  Clear to auscultation CV: reg rate and rhythm, no murmur ABD: abdomen is  soft, nontender.  no hepatosplenomegaly.  not distended.  no hernia MUSCULOSKELETAL: muscle bulk and strength are grossly normal.  no obvious joint swelling.  gait is normal and steady EXTEMITIES: no deformity.  no ulcer on the feet.  feet are of normal color and temp.  no edema PULSES: dorsalis pedis intact bilat.  no carotid bruit NEURO:  cn 2-12 grossly intact.   readily moves all 4's.  sensation is intact to touch on the feet SKIN:  Normal texture and temperature.  No rash or suspicious lesion is visible.   NODES:  None palpable at the neck PSYCH: alert, well-oriented.  Does not appear anxious nor depressed.  (i reviewed carotid US report) Lab Results  Component Value Date   TSH 1.22 06/27/2013      Assessment & Plan:  Thyroid nodule, new. Hoarseness, unlikely  related to the thyroid. TIA: this increases any surgical risk.  This will have to be considered if the bx indicates the need sur surgery.

## 2013-06-28 ENCOUNTER — Other Ambulatory Visit: Payer: Self-pay | Admitting: Internal Medicine

## 2013-07-11 ENCOUNTER — Ambulatory Visit
Admission: RE | Admit: 2013-07-11 | Discharge: 2013-07-11 | Disposition: A | Discharge status: Home / Self Care | Payer: Medicare Other | Source: Ambulatory Visit | Attending: Endocrinology | Admitting: Endocrinology

## 2013-07-11 ENCOUNTER — Other Ambulatory Visit: Payer: Self-pay | Admitting: Endocrinology

## 2013-07-11 ENCOUNTER — Other Ambulatory Visit (HOSPITAL_COMMUNITY)
Admission: RE | Admit: 2013-07-11 | Discharge: 2013-07-11 | Disposition: A | Discharge status: Home / Self Care | Payer: Medicare Other | Source: Ambulatory Visit | Attending: Interventional Radiology | Admitting: Interventional Radiology

## 2013-07-11 DIAGNOSIS — R220 Localized swelling, mass and lump, head: Secondary | ICD-10-CM

## 2013-07-11 DIAGNOSIS — E041 Nontoxic single thyroid nodule: Secondary | ICD-10-CM | POA: Insufficient documentation

## 2013-07-11 DIAGNOSIS — R221 Localized swelling, mass and lump, neck: Principal | ICD-10-CM

## 2013-07-30 LAB — HM DIABETES EYE EXAM

## 2013-08-07 ENCOUNTER — Ambulatory Visit: Payer: 59 | Admitting: Internal Medicine

## 2013-08-28 ENCOUNTER — Other Ambulatory Visit: Payer: Self-pay | Admitting: Internal Medicine

## 2013-09-20 LAB — HM DIABETES EYE EXAM

## 2013-09-21 ENCOUNTER — Ambulatory Visit: Payer: Medicare Other | Admitting: Internal Medicine

## 2013-09-24 ENCOUNTER — Other Ambulatory Visit: Payer: Self-pay | Admitting: Internal Medicine

## 2013-10-04 ENCOUNTER — Other Ambulatory Visit: Payer: Self-pay | Admitting: Internal Medicine

## 2013-10-05 ENCOUNTER — Other Ambulatory Visit (HOSPITAL_COMMUNITY)
Admission: RE | Admit: 2013-10-05 | Discharge: 2013-10-05 | Disposition: A | Discharge status: Home / Self Care | Payer: Medicare Other | Source: Ambulatory Visit | Attending: Otolaryngology | Admitting: Otolaryngology

## 2013-10-05 ENCOUNTER — Other Ambulatory Visit: Payer: Self-pay | Admitting: Otolaryngology

## 2013-10-05 DIAGNOSIS — K119 Disease of salivary gland, unspecified: Secondary | ICD-10-CM | POA: Diagnosis present

## 2013-11-05 ENCOUNTER — Other Ambulatory Visit (INDEPENDENT_AMBULATORY_CARE_PROVIDER_SITE_OTHER): Payer: Medicare Other

## 2013-11-05 ENCOUNTER — Other Ambulatory Visit: Payer: Self-pay | Admitting: Internal Medicine

## 2013-11-05 ENCOUNTER — Ambulatory Visit (INDEPENDENT_AMBULATORY_CARE_PROVIDER_SITE_OTHER): Payer: Medicare Other | Admitting: Internal Medicine

## 2013-11-05 ENCOUNTER — Encounter: Payer: Self-pay | Admitting: Internal Medicine

## 2013-11-05 VITALS — BP 138/76 | HR 82 | Temp 98.0°F | Ht 62.5 in | Wt 168.2 lb

## 2013-11-05 DIAGNOSIS — R5383 Other fatigue: Secondary | ICD-10-CM

## 2013-11-05 DIAGNOSIS — E1139 Type 2 diabetes mellitus with other diabetic ophthalmic complication: Secondary | ICD-10-CM

## 2013-11-05 DIAGNOSIS — E1165 Type 2 diabetes mellitus with hyperglycemia: Secondary | ICD-10-CM

## 2013-11-05 DIAGNOSIS — K219 Gastro-esophageal reflux disease without esophagitis: Secondary | ICD-10-CM

## 2013-11-05 DIAGNOSIS — Z23 Encounter for immunization: Secondary | ICD-10-CM

## 2013-11-05 DIAGNOSIS — R5381 Other malaise: Secondary | ICD-10-CM

## 2013-11-05 DIAGNOSIS — Z0000 Encounter for general adult medical examination without abnormal findings: Secondary | ICD-10-CM

## 2013-11-05 LAB — HEPATIC FUNCTION PANEL
ALBUMIN: 4.2 g/dL (ref 3.5–5.2)
ALT: 25 U/L (ref 0–35)
AST: 23 U/L (ref 0–37)
Alkaline Phosphatase: 59 U/L (ref 39–117)
Bilirubin, Direct: 0.1 mg/dL (ref 0.0–0.3)
TOTAL PROTEIN: 8 g/dL (ref 6.0–8.3)
Total Bilirubin: 0.7 mg/dL (ref 0.2–1.2)

## 2013-11-05 LAB — MICROALBUMIN / CREATININE URINE RATIO
Creatinine,U: 170.7 mg/dL
MICROALB UR: 0.6 mg/dL (ref 0.0–1.9)
MICROALB/CREAT RATIO: 0.4 mg/g (ref 0.0–30.0)

## 2013-11-05 LAB — CBC WITH DIFFERENTIAL/PLATELET
BASOS ABS: 0 10*3/uL (ref 0.0–0.1)
BASOS PCT: 0.4 % (ref 0.0–3.0)
Eosinophils Absolute: 0.5 10*3/uL (ref 0.0–0.7)
Eosinophils Relative: 6.7 % — ABNORMAL HIGH (ref 0.0–5.0)
HCT: 37.1 % (ref 36.0–46.0)
HEMOGLOBIN: 12.3 g/dL (ref 12.0–15.0)
LYMPHS ABS: 2.7 10*3/uL (ref 0.7–4.0)
Lymphocytes Relative: 37.5 % (ref 12.0–46.0)
MCHC: 33.1 g/dL (ref 30.0–36.0)
MCV: 91.9 fl (ref 78.0–100.0)
Monocytes Absolute: 0.5 10*3/uL (ref 0.1–1.0)
Monocytes Relative: 6.7 % (ref 3.0–12.0)
Neutro Abs: 3.5 10*3/uL (ref 1.4–7.7)
Neutrophils Relative %: 48.7 % (ref 43.0–77.0)
Platelets: 222 10*3/uL (ref 150.0–400.0)
RBC: 4.04 Mil/uL (ref 3.87–5.11)
RDW: 15.5 % (ref 11.5–15.5)
WBC: 7.2 10*3/uL (ref 4.0–10.5)

## 2013-11-05 LAB — HEMOGLOBIN A1C: Hgb A1c MFr Bld: 7.8 % — ABNORMAL HIGH (ref 4.6–6.5)

## 2013-11-05 LAB — BASIC METABOLIC PANEL
BUN: 32 mg/dL — AB (ref 6–23)
CALCIUM: 10.2 mg/dL (ref 8.4–10.5)
CO2: 28 mEq/L (ref 19–32)
Chloride: 105 mEq/L (ref 96–112)
Creatinine, Ser: 1.3 mg/dL — ABNORMAL HIGH (ref 0.4–1.2)
GFR: 53.66 mL/min — AB (ref >60.00)
GLUCOSE: 146 mg/dL — AB (ref 70–99)
Potassium: 4.4 mEq/L (ref 3.5–5.1)
SODIUM: 141 meq/L (ref 135–145)

## 2013-11-05 LAB — LIPID PANEL
Cholesterol: 193 mg/dL (ref 0–200)
HDL: 49.1 mg/dL (ref >39.00)
LDL Cholesterol: 109 mg/dL — ABNORMAL HIGH (ref 0–99)
NonHDL: 143.9
Total CHOL/HDL Ratio: 4
Triglycerides: 173 mg/dL — ABNORMAL HIGH (ref 0.0–149.0)
VLDL: 34.6 mg/dL (ref 0.0–40.0)

## 2013-11-05 MED ORDER — OMEPRAZOLE 20 MG PO CPDR: 20.0000 mg | DELAYED_RELEASE_CAPSULE | Freq: Every day | ORAL | Status: DC

## 2013-11-05 MED ORDER — SIMVASTATIN 20 MG PO TABS: 20.0000 mg | ORAL_TABLET | Freq: Every day | ORAL | Status: DC

## 2013-11-05 NOTE — Assessment & Plan Note (Signed)
On metformin + amaryl - home cbgs <150 per pt report Reports working on weight loss thru diet and exercise reminded of importance of lifestyle control -  Also continue ASA 81, statin and ARB Follows with optho annually and podiatry  Lab Results  Component Value Date   HGBA1C 7.5* 05/08/2013   HGBA1C 7.3* 01/04/2013   HGBA1C 7.9* 08/31/2012

## 2013-11-05 NOTE — Progress Notes (Signed)
Subjective:    Patient ID: DEIRA SHIMER, female    DOB: 09-Nov-1940, 73 y.o.   MRN: [id number]  HPI   Here for medicare wellness and annual physical/exam  Diet: heart healthy, diabetic Physical activity: sedentary Depression/mood screen: negative Hearing: intact to whispered voice Visual acuity: grossly normal, performs annual eye exam  ADLs: capable Fall risk: none Home safety: good Cognitive evaluation: intact to orientation, naming, recall and repetition EOL planning: adv directives, full code/ I agree  I have personally reviewed and have noted 1. The patient's medical and social history 2. Their use of alcohol, tobacco or illicit drugs 3. Their current medications and supplements 4. The patient's functional ability including ADL's, fall risks, home safety risks and hearing or visual impairment. 5. Diet and physical activities 6. Evidence for depression or mood disorders  Also reviewed chronic medical issues and interval medical events  Past Medical History  Diagnosis Date  . 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 Hypertension   . Hyperlipidemia   . Diabetes mellitus, type 2   . Allergic urticaria   . Thyroid nodule 05/15/2013    Right side, IR bx 3/32: benign follicular nodule  . 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 Breast cancer Mother   . Diabetes Other   . Hypertension Other   . Alcohol abuse Other    History  Substance Use Topics  . Smoking status: Never Smoker   . Smokeless tobacco: Not on file  . Alcohol Use: No    Review of Systems  Constitutional: Positive for fatigue. Negative for unexpected weight change.  HENT: Negative for postnasal drip, sore throat, trouble swallowing and voice change.   Respiratory: Negative for cough, shortness of breath and wheezing.   Cardiovascular: Negative for chest pain, palpitations and leg swelling.  Gastrointestinal:  Positive for constipation. Negative for nausea, abdominal pain and diarrhea.       "backwash and reflux" x 2 mo  Neurological: Negative for dizziness, weakness, light-headedness and headaches.  Psychiatric/Behavioral: Negative for dysphoric mood. The patient is not nervous/anxious.   All other systems reviewed and are negative.      Objective:   Physical Exam  BP 138/76  Pulse 82  Temp(Src) 98 F (36.7 C) (Oral)  Ht 5' 2.5" (1.588 m)  Wt 168 lb 4 oz (76.318 kg)  BMI 30.26 kg/m2  SpO2 97% Wt Readings from Last 3 Encounters:  11/05/13 168 lb 4 oz (76.318 kg)  06/27/13 172 lb (78.019 kg)  05/08/13 169 lb 1.9 oz (76.712 kg)   Constitutional: She is overweight, appears well-developed and well-nourished. No distress.  HENT: Head: Normocephalic and atraumatic. Ears: B TMs ok, no erythema or effusion; Nose: Nose normal. Mouth/Throat: Oropharynx is clear and moist. No oropharyngeal exudate.  Eyes: Conjunctivae and EOM are normal. Pupils are equal, round, and reactive to light. No scleral icterus.  Neck: R submandibular prominence without change. Normal range of motion. Neck supple. No JVD present. No thyromegaly present.  Cardiovascular: Normal rate, regular rhythm and normal heart sounds.  No murmur heard. No BLE edema. Pulmonary/Chest: Effort normal and breath sounds normal. No respiratory distress. She has no wheezes.  Abdominal: Soft. Bowel sounds are normal. She exhibits no distension. There is no tenderness. no masses GU/breast: defer to gyn Musculoskeletal: Normal range of motion, no joint effusions. No gross deformities Neurological: She is alert and oriented to person, place, and time. No  cranial nerve deficit. Coordination, balance, strength, speech and gait are normal.  Skin: Skin is warm and dry. No rash noted. No erythema.  Psychiatric: She has a normal mood and affect. Her behavior is normal. Judgment and thought content normal.    Lab Results  Component Value Date   WBC  8.2 03/20/2013   HGB 11.8* 03/20/2013   HCT 39.0 03/20/2013   PLT 186.0 09/30/2010   GLUCOSE 218* 03/20/2013   CHOL 99 08/31/2012   TRIG 84.0 08/31/2012   HDL 49.30 08/31/2012   LDLDIRECT 92.2 06/24/2010   LDLCALC 33 08/31/2012   ALT 35 03/20/2013   AST 26 03/20/2013   NA 137 03/20/2013   K 4.4 03/20/2013   CL 103 03/20/2013   CREATININE 1.10 03/20/2013   BUN 27* 03/20/2013   CO2 25 03/20/2013   TSH 1.22 06/27/2013   HGBA1C 7.5* 05/08/2013   MICROALBUR 1.3 08/31/2012    No results found.     Assessment & Plan:   AWV/CPX/v70.0 - Today patient counseled on age appropriate routine health concerns for screening and prevention, each reviewed and up to date or declined. Immunizations reviewed and up to date or declined. Labs ordered and reviewed. Risk factors for depression reviewed and negative. Hearing function and visual acuity are intact. ADLs screened and addressed as needed. Functional ability and level of safety reviewed and appropriate. Education, counseling and referrals performed based on assessed risks today. Patient provided with a copy of personalized plan for preventive services.  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)      On metformin + amaryl - home cbgs <150 per pt report Reports working on weight loss thru diet and exercise reminded of importance of lifestyle control -  Also continue ASA 81, statin and ARB Follows with optho annually and podiatry  Lab Results  Component Value Date   HGBA1C 7.5* 05/08/2013   HGBA1C 7.3* 01/04/2013   HGBA1C 7.9* 08/31/2012        Relevant Medications      simvastatin (ZOCOR) tablet   Other Relevant Orders      Hemoglobin A1c      Basic metabolic panel      Lipid panel      Microalbumin / creatinine urine ratio    Other Visit Diagnoses   Routine general medical examination at a health care facility    -  Primary    Other fatigue        Relevant Orders       Basic metabolic panel       CBC with Differential       Hepatic function panel       TSH    Need for prophylactic vaccination and inoculation against influenza        Relevant Orders       Flu Vaccine QUAD 36+ mos PF IM (Fluarix Quad PF)       Fatigue - nonspecific symptoms/exam - check screening labs

## 2013-11-05 NOTE — Progress Notes (Signed)
Pre visit review using our clinic review tool, if applicable. No additional management support is needed unless otherwise documented below in the visit note. 

## 2013-11-05 NOTE — Patient Instructions (Addendum)
It was good to see you today.  We have reviewed your prior records including labs and tests today  Health Maintenance reviewed - flu shot and tetanus immunization updated today - all other recommended immunizations and age-appropriate screenings are up-to-date.  We will check on your eligibility for insurance coverage on shingles vaccination  Test(s) ordered today. Your results will be released to [facility] (or called to you) after review, usually within 72hours after test completion. If any changes need to be made, you will be notified at that same time.  Medications reviewed and updated Start omeprazole 20 mg once daily for acid indigestion and reflux. No other changes recommended at this time. Your prescription(s)/refills have been submitted to your pharmacy. Please take as directed and contact our office if you believe you are having problem(s) with the medication(s).  Please schedule followup in 6 months for semiannual exam and labs, call sooner if problems.  Health Maintenance Adopting a healthy lifestyle and getting preventive care can go a long way to promote health and wellness. Talk with your health care provider about what schedule of regular examinations is right for you. This is a good chance for you to check in with your provider about disease prevention and staying healthy. In between checkups, there are plenty of things you can do on your own. Experts have done a lot of research about which lifestyle changes and preventive measures are most likely to keep you healthy. Ask your health care provider for more information. WEIGHT AND DIET  Eat a healthy diet  Be sure to include plenty of vegetables, fruits, low-fat dairy products, and lean protein.  Do not eat a lot of foods high in solid fats, added sugars, or salt.  Get regular exercise. This is one of the most important things you can do for your health.  Most adults should exercise for at least 150 minutes each week. The  exercise should increase your heart rate and make you sweat (moderate-intensity exercise).  Most adults should also do strengthening exercises at least twice a week. This is in addition to the moderate-intensity exercise.  Maintain a healthy weight  Body mass index (BMI) is a measurement that can be used to identify possible weight problems. It estimates body fat based on height and weight. Your health care provider can help determine your BMI and help you achieve or maintain a healthy weight.  For females 40 years of age and older:   A BMI below 18.5 is considered underweight.  A BMI of 18.5 to 24.9 is normal.  A BMI of 25 to 29.9 is considered overweight.  A BMI of 30 and above is considered obese.  Watch levels of cholesterol and blood lipids  You should start having your blood tested for lipids and cholesterol at 72 years of age, then have this test every 5 years.  You may need to have your cholesterol levels checked more often if:  Your lipid or cholesterol levels are high.  You are older than 72 years of age.  You are at high risk for heart disease.  CANCER SCREENING   Lung Cancer  Lung cancer screening is recommended for adults 32-8 years old who are at high risk for lung cancer because of a history of smoking.  A yearly low-dose CT scan of the lungs is recommended for people who:  Currently smoke.  Have quit within the past 15 years.  Have at least a 30-pack-year history of smoking. A pack year is smoking an average  of one pack of cigarettes a day for 1 year.  Yearly screening should continue until it has been 15 years since you quit.  Yearly screening should stop if you develop a health problem that would prevent you from having lung cancer treatment.  Breast Cancer  Practice breast self-awareness. This means understanding how your breasts normally appear and feel.  It also means doing regular breast self-exams. Let your health care provider know about  any changes, no matter how small.  If you are in your 20s or 30s, you should have a clinical breast exam (CBE) by a health care provider every 1-3 years as part of a regular health exam.  If you are 67 or older, have a CBE every year. Also consider having a breast X-ray (mammogram) every year.  If you have a family history of breast cancer, talk to your health care provider about genetic screening.  If you are at high risk for breast cancer, talk to your health care provider about having an MRI and a mammogram every year.  Breast cancer gene (BRCA) assessment is recommended for women who have family members with BRCA-related cancers. BRCA-related cancers include:  Breast.  Ovarian.  Tubal.  Peritoneal cancers.  Results of the assessment will determine the need for genetic counseling and BRCA1 and BRCA2 testing. Cervical Cancer Routine pelvic examinations to screen for cervical cancer are no longer recommended for nonpregnant women who are considered low risk for cancer of the pelvic organs (ovaries, uterus, and vagina) and who do not have symptoms. A pelvic examination may be necessary if you have symptoms including those associated with pelvic infections. Ask your health care provider if a screening pelvic exam is right for you.   The Pap test is the screening test for cervical cancer for women who are considered at risk.  If you had a hysterectomy for a problem that was not cancer or a condition that could lead to cancer, then you no longer need Pap tests.  If you are older than 65 years, and you have had normal Pap tests for the past 10 years, you no longer need to have Pap tests.  If you have had past treatment for cervical cancer or a condition that could lead to cancer, you need Pap tests and screening for cancer for at least 20 years after your treatment.  If you no longer get a Pap test, assess your risk factors if they change (such as having a new sexual partner). This can  affect whether you should start being screened again.  Some women have medical problems that increase their chance of getting cervical cancer. If this is the case for you, your health care provider may recommend more frequent screening and Pap tests.  The human papillomavirus (HPV) test is another test that may be used for cervical cancer screening. The HPV test looks for the virus that can cause cell changes in the cervix. The cells collected during the Pap test can be tested for HPV.  The HPV test can be used to screen women 17 years of age and older. Getting tested for HPV can extend the interval between normal Pap tests from three to five years.  An HPV test also should be used to screen women of any age who have unclear Pap test results.  After 72 years of age, women should have HPV testing as often as Pap tests.  Colorectal Cancer  This type of cancer can be detected and often prevented.  Routine colorectal  cancer screening usually begins at 72 years of age and continues through 72 years of age.  Your health care provider may recommend screening at an earlier age if you have risk factors for colon cancer.  Your health care provider may also recommend using home test kits to check for hidden blood in the stool.  A small camera at the end of a tube can be used to examine your colon directly (sigmoidoscopy or colonoscopy). This is done to check for the earliest forms of colorectal cancer.  Routine screening usually begins at age 47.  Direct examination of the colon should be repeated every 5-10 years through 72 years of age. However, you may need to be screened more often if early forms of precancerous polyps or small growths are found. Skin Cancer  Check your skin from head to toe regularly.  Tell your health care provider about any new moles or changes in moles, especially if there is a change in a mole's shape or color.  Also tell your health care provider if you have a mole  that is larger than the size of a pencil eraser.  Always use sunscreen. Apply sunscreen liberally and repeatedly throughout the day.  Protect yourself by wearing long sleeves, pants, a wide-brimmed hat, and sunglasses whenever you are outside. HEART DISEASE, DIABETES, AND HIGH BLOOD PRESSURE   Have your blood pressure checked at least every 1-2 years. High blood pressure causes heart disease and increases the risk of stroke.  If you are between 25 years and 23 years old, ask your health care provider if you should take aspirin to prevent strokes.  Have regular diabetes screenings. This involves taking a blood sample to check your fasting blood sugar level.  If you are at a normal weight and have a low risk for diabetes, have this test once every three years after 72 years of age.  If you are overweight and have a high risk for diabetes, consider being tested at a younger age or more often. PREVENTING INFECTION  Hepatitis B  If you have a higher risk for hepatitis B, you should be screened for this virus. You are considered at high risk for hepatitis B if:  You were born in a country where hepatitis B is common. Ask your health care provider which countries are considered high risk.  Your parents were born in a high-risk country, and you have not been immunized against hepatitis B (hepatitis B vaccine).  You have HIV or AIDS.  You use needles to inject street drugs.  You live with someone who has hepatitis B.  You have had sex with someone who has hepatitis B.  You get hemodialysis treatment.  You take certain medicines for conditions, including cancer, organ transplantation, and autoimmune conditions. Hepatitis C  Blood testing is recommended for:  Everyone born from [birth year] through 1964.  Anyone with known risk factors for hepatitis C. Sexually transmitted infections (STIs)  You should be screened for sexually transmitted infections (STIs) including gonorrhea and chlamydia  if:  You are sexually active and are younger than 72 years of age.  You are older than 72 years of age and your health care provider tells you that you are at risk for this type of infection.  Your sexual activity has changed since you were last screened and you are at an increased risk for chlamydia or gonorrhea. Ask your health care provider if you are at risk.  If you do not have HIV, but are at risk,  it may be recommended that you take a prescription medicine daily to prevent HIV infection. This is called pre-exposure prophylaxis (PrEP). You are considered at risk if:  You are sexually active and do not regularly use condoms or know the HIV status of your partner(s).  You take drugs by injection.  You are sexually active with a partner who has HIV. Talk with your health care provider about whether you are at high risk of being infected with HIV. If you choose to begin PrEP, you should first be tested for HIV. You should then be tested every 3 months for as long as you are taking PrEP.  PREGNANCY   If you are premenopausal and you may become pregnant, ask your health care provider about preconception counseling.  If you may become pregnant, take 400 to 800 micrograms (mcg) of folic acid every day.  If you want to prevent pregnancy, talk to your health care provider about birth control (contraception). OSTEOPOROSIS AND MENOPAUSE   Osteoporosis is a disease in which the bones lose minerals and strength with aging. This can result in serious bone fractures. Your risk for osteoporosis can be identified using a bone density scan.  If you are 92 years of age or older, or if you are at risk for osteoporosis and fractures, ask your health care provider if you should be screened.  Ask your health care provider whether you should take a calcium or vitamin D supplement to lower your risk for osteoporosis.  Menopause may have certain physical symptoms and risks.  Hormone replacement therapy  may reduce some of these symptoms and risks. Talk to your health care provider about whether hormone replacement therapy is right for you.  HOME CARE INSTRUCTIONS   Schedule regular health, dental, and eye exams.  Stay current with your immunizations.   Do not use any tobacco products including cigarettes, chewing tobacco, or electronic cigarettes.  If you are pregnant, do not drink alcohol.  If you are breastfeeding, limit how much and how often you drink alcohol.  Limit alcohol intake to no more than 1 drink per day for nonpregnant women. One drink equals 12 ounces of beer, 5 ounces of wine, or 1 ounces of hard liquor.  Do not use street drugs.  Do not share needles.  Ask your health care provider for help if you need support or information about quitting drugs.  Tell your health care provider if you often feel depressed.  Tell your health care provider if you have ever been abused or do not feel safe at home. Document Released: 08/17/2010 Document Revised: 06/18/2013 Document Reviewed: 01/03/2013 [facility] Patient Information 2015 [place], [place]. This information is not intended to replace advice given to you by your health care provider. Make sure you discuss any questions you have with your health care provider. Gastroesophageal Reflux Disease, Adult Gastroesophageal reflux disease (GERD) happens when acid from your stomach goes into your food pipe (esophagus). The acid can cause a burning feeling in your chest. Over time, the acid can make small holes (ulcers) in your food pipe.  HOME CARE  Ask your doctor for advice about:  Losing weight.  Quitting smoking.  Alcohol use.  Avoid foods and drinks that make your problems worse. You may want to avoid:  Caffeine and alcohol.  Chocolate.  Mints.  Garlic and onions.  Spicy foods.  Citrus fruits, such as oranges, lemons, or limes.  Foods that contain tomato, such as sauce, chili, salsa, and pizza.  Maceo Pro and  fatty foods.  Avoid lying down for 3 hours before you go to bed or before you take a nap.  Eat small meals often, instead of large meals.  Wear loose-fitting clothing. Do not wear anything tight around your waist.  Raise (elevate) the head of your bed 6 to 8 inches with wood blocks. Using extra pillows does not help.  Only take medicines as told by your doctor.  Do not take aspirin or ibuprofen. GET HELP RIGHT AWAY IF:   You have pain in your arms, neck, jaw, teeth, or back.  Your pain gets worse or changes.  You feel sick to your stomach (nauseous), throw up (vomit), or sweat (diaphoresis).  You feel short of breath, or you pass out (faint).  Your throw up is green, yellow, black, or looks like coffee grounds or blood.  Your poop (stool) is red, bloody, or black. MAKE SURE YOU:   Understand these instructions.  Will watch your condition.  Will get help right away if you are not doing well or get worse. Document Released: 07/21/2007 Document Revised: 04/26/2011 Document Reviewed: 08/21/2010 [facility] Patient Information 2015 [place], [place]. This information is not intended to replace advice given to you by your health care provider. Make sure you discuss any questions you have with your health care provider.

## 2013-11-05 NOTE — Assessment & Plan Note (Signed)
Mild "reflux and backwash" symptoms x 2-3 mo No weight loss, dysphagia or other red flags Start PPI qd - Also advised on dietary control measures If unimproved, or if worse, in next 30d, pt will call for further instructions as needed

## 2013-11-06 LAB — BASIC METABOLIC PANEL
BUN: 31 mg/dL — ABNORMAL HIGH (ref 6–23)
CALCIUM: 10 mg/dL (ref 8.4–10.5)
CHLORIDE: 106 meq/L (ref 96–112)
CO2: 22 mEq/L (ref 19–32)
CREATININE: 1.2 mg/dL (ref 0.4–1.2)
GFR: 56.77 mL/min — ABNORMAL LOW (ref >60.00)
Glucose, Bld: 138 mg/dL — ABNORMAL HIGH (ref 70–99)
Potassium: 4.6 mEq/L (ref 3.5–5.1)
Sodium: 140 mEq/L (ref 135–145)

## 2013-11-06 LAB — TSH: TSH: 2.02 u[IU]/mL (ref 0.35–4.50)

## 2013-12-03 ENCOUNTER — Other Ambulatory Visit: Payer: Self-pay | Admitting: Internal Medicine

## 2014-01-05 ENCOUNTER — Telehealth: Payer: Self-pay

## 2014-01-05 ENCOUNTER — Other Ambulatory Visit: Payer: Self-pay | Admitting: Internal Medicine

## 2014-01-05 NOTE — Telephone Encounter (Signed)
LVM for pt to call back.   RE: eye exam for 2015?

## 2014-01-07 NOTE — Telephone Encounter (Signed)
Pt got message, she was returning your call

## 2014-01-08 NOTE — Telephone Encounter (Signed)
LVM for pt to call back.

## 2014-01-09 ENCOUNTER — Other Ambulatory Visit: Payer: Self-pay

## 2014-01-09 MED ORDER — GLIMEPIRIDE 2 MG PO TABS: 2.0000 mg | ORAL_TABLET | Freq: Every day | ORAL | Status: DC

## 2014-01-16 NOTE — Telephone Encounter (Signed)
Pt lvm,   Called pt back:   Pt has had an eye exam this year.   Dr. Dennard Nip at [facility] in [place], [place]. [phone]  Fax request for exam sent to [phone].   Waiting on return fax with exam notes.

## 2014-01-31 ENCOUNTER — Other Ambulatory Visit: Payer: Self-pay | Admitting: Internal Medicine

## 2014-03-04 ENCOUNTER — Encounter: Payer: Self-pay | Admitting: Internal Medicine

## 2014-04-01 LAB — HM DIABETES EYE EXAM

## 2014-04-15 ENCOUNTER — Other Ambulatory Visit: Payer: Self-pay

## 2014-04-15 DIAGNOSIS — Z1231 Encounter for screening mammogram for malignant neoplasm of breast: Secondary | ICD-10-CM

## 2014-05-02 ENCOUNTER — Ambulatory Visit
Admission: RE | Admit: 2014-05-02 | Discharge: 2014-05-02 | Disposition: A | Discharge status: Home / Self Care | Payer: Medicare Other | Source: Ambulatory Visit

## 2014-05-02 DIAGNOSIS — Z1231 Encounter for screening mammogram for malignant neoplasm of breast: Secondary | ICD-10-CM

## 2014-05-07 ENCOUNTER — Ambulatory Visit (INDEPENDENT_AMBULATORY_CARE_PROVIDER_SITE_OTHER): Payer: Medicare Other | Admitting: Internal Medicine

## 2014-05-07 ENCOUNTER — Encounter: Payer: Self-pay | Admitting: Internal Medicine

## 2014-05-07 ENCOUNTER — Other Ambulatory Visit (INDEPENDENT_AMBULATORY_CARE_PROVIDER_SITE_OTHER): Payer: Medicare Other

## 2014-05-07 VITALS — BP 112/56 | HR 74 | Temp 97.7°F | Wt 167.5 lb

## 2014-05-07 DIAGNOSIS — E785 Hyperlipidemia, unspecified: Secondary | ICD-10-CM | POA: Diagnosis not present

## 2014-05-07 DIAGNOSIS — E11359 Type 2 diabetes mellitus with proliferative diabetic retinopathy without macular edema: Secondary | ICD-10-CM | POA: Diagnosis not present

## 2014-05-07 DIAGNOSIS — E113599 Type 2 diabetes mellitus with proliferative diabetic retinopathy without macular edema, unspecified eye: Secondary | ICD-10-CM

## 2014-05-07 DIAGNOSIS — I10 Essential (primary) hypertension: Secondary | ICD-10-CM

## 2014-05-07 DIAGNOSIS — Z23 Encounter for immunization: Secondary | ICD-10-CM

## 2014-05-07 LAB — BASIC METABOLIC PANEL
BUN: 31 mg/dL — ABNORMAL HIGH (ref 6–23)
CALCIUM: 10.3 mg/dL (ref 8.4–10.5)
CO2: 33 mEq/L — ABNORMAL HIGH (ref 19–32)
CREATININE: 1.37 mg/dL — AB (ref 0.40–1.20)
Chloride: 102 mEq/L (ref 96–112)
GFR: 48.65 mL/min — AB (ref >60.00)
Glucose, Bld: 118 mg/dL — ABNORMAL HIGH (ref 70–99)
POTASSIUM: 4.6 meq/L (ref 3.5–5.1)
Sodium: 140 mEq/L (ref 135–145)

## 2014-05-07 LAB — MICROALBUMIN / CREATININE URINE RATIO
CREATININE, U: 166 mg/dL
Microalb Creat Ratio: 0.7 mg/g (ref 0.0–30.0)
Microalb, Ur: 1.1 mg/dL (ref 0.0–1.9)

## 2014-05-07 LAB — LIPID PANEL
CHOL/HDL RATIO: 3
CHOLESTEROL: 182 mg/dL (ref 0–200)
HDL: 55.8 mg/dL (ref >39.00)
NonHDL: 126.2
Triglycerides: 270 mg/dL — ABNORMAL HIGH (ref 0.0–149.0)
VLDL: 54 mg/dL — AB (ref 0.0–40.0)

## 2014-05-07 LAB — LDL CHOLESTEROL, DIRECT: Direct LDL: 54 mg/dL

## 2014-05-07 LAB — HEMOGLOBIN A1C: HEMOGLOBIN A1C: 7.4 % — AB (ref 4.6–6.5)

## 2014-05-07 MED ORDER — METFORMIN HCL 1000 MG PO TABS: 1000.0000 mg | ORAL_TABLET | Freq: Two times a day (BID) | ORAL | Status: DC

## 2014-05-07 MED ORDER — GLIMEPIRIDE 2 MG PO TABS: 2.0000 mg | ORAL_TABLET | Freq: Every day | ORAL | Status: DC

## 2014-05-07 MED ORDER — LOSARTAN POTASSIUM-HCTZ 100-25 MG PO TABS: 1.0000 | ORAL_TABLET | Freq: Every day | ORAL | Status: DC

## 2014-05-07 MED ORDER — AMLODIPINE BESYLATE 10 MG PO TABS: 10.0000 mg | ORAL_TABLET | Freq: Every day | ORAL | Status: DC

## 2014-05-07 NOTE — Assessment & Plan Note (Signed)
BP Readings from Last 3 Encounters:  05/07/14 112/56  11/05/13 138/76  06/27/13 122/72   continue ARB and hctz w/ amlodipine -  Changed low dose atenolol to amlodipine 5mg  12/2011 The current medical regimen is effective;  continue present plan and medications.

## 2014-05-07 NOTE — Assessment & Plan Note (Signed)
Started low dose simva 06/2010 - tolerating well, but wants to not take stsatin class due to feared complications Reviewed risk for CAD/CVA, esp with TIA 03/2013 and DM/HTN co morbidities Last lipids reviewed - check annually The current medical regimen is effective;  continue present plan and medications.

## 2014-05-07 NOTE — Progress Notes (Signed)
Pre visit review using our clinic review tool, if applicable. No additional management support is needed unless otherwise documented below in the visit note. 

## 2014-05-07 NOTE — Progress Notes (Signed)
Subjective:    Patient ID: Patricia Mccullough, female    DOB: 01-28-1941, 74 y.o.   MRN: [id number]  HPI  Patient here for follow up  Past Medical History  Diagnosis Date  . 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 Hypertension   . Hyperlipidemia   . Diabetes mellitus, type 2   . Allergic urticaria   . Thyroid nodule 05/15/2013    Right side, IR bx 1/44: benign follicular nodule  . 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 Negative for unexpected weight change.  Respiratory: Negative for cough and shortness of breath.   Cardiovascular: Negative for chest pain and leg swelling.       Objective:    Physical Exam  Constitutional: She appears well-developed and well-nourished. No distress.  Cardiovascular: Normal rate, regular rhythm and normal heart sounds.   No murmur heard. Pulmonary/Chest: Effort normal and breath sounds normal. No respiratory distress.  Musculoskeletal: She exhibits no edema.    BP 112/56 mmHg  Pulse 74  Temp(Src) 97.7 F (36.5 C) (Oral)  Wt 167 lb 8 oz (75.978 kg) Wt Readings from Last 3 Encounters:  05/07/14 167 lb 8 oz (75.978 kg)  11/05/13 168 lb 4 oz (76.318 kg)  06/27/13 172 lb (78.019 kg)    Lab Results  Component Value Date   WBC 7.2 11/05/2013   HGB 12.3 11/05/2013   HCT 37.1 11/05/2013   PLT 222.0 11/05/2013   GLUCOSE 146* 11/05/2013   GLUCOSE 138* 11/05/2013   CHOL 193 11/05/2013   TRIG 173.0* 11/05/2013   HDL 49.10 11/05/2013   LDLDIRECT 92.2 06/24/2010   LDLCALC 109* 11/05/2013   ALT 25 11/05/2013   AST 23 11/05/2013   NA 141 11/05/2013   NA 140 11/05/2013   K 4.4 11/05/2013   K 4.6 11/05/2013   CL 105 11/05/2013   CL 106 11/05/2013   CREATININE 1.3* 11/05/2013   CREATININE 1.2 11/05/2013   BUN 32* 11/05/2013   BUN 31* 11/05/2013   CO2 28 11/05/2013   CO2 22 11/05/2013   TSH 2.02 11/05/2013   HGBA1C 7.8* 11/05/2013   MICROALBUR 0.6 11/05/2013    Mm Screening Breast Tomo Bilateral  05/03/2014   CLINICAL DATA:  Screening.  EXAM: DIGITAL SCREENING BILATERAL MAMMOGRAM WITH 3D TOMO WITH CAD  COMPARISON:  Previous exam(s).  ACR Breast Density Category c: The breast tissue is heterogeneously dense, which may obscure small masses.  FINDINGS: There are no findings suspicious for malignancy. Images were processed with CAD.  IMPRESSION: No mammographic evidence of malignancy. A result letter of this screening mammogram will be mailed directly to the patient.  RECOMMENDATION: Screening mammogram in one year. (Code:[id number]-B-01Y)  BI-RADS CATEGORY  1: Negative.   Electronically Signed   By: Abelardo Diesel M.D.   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    On metformin + amaryl - home cbgs <150 per pt report Reports working on weight loss thru diet and exercise reminded of importance of lifestyle control -  Also continue ASA 81 and ARB - refuses statin Follows with optho annually and podiatry  Lab Results  Component Value Date   HGBA1C 7.8* 11/05/2013   HGBA1C 7.5* 05/08/2013   HGBA1C 7.3* 01/04/2013        Dyslipidemia  Started low dose simva 06/2010 - tolerating well, but wants to not take stsatin class due to feared complications Reviewed risk for CAD/CVA, esp with TIA 03/2013 and DM/HTN co morbidities Last lipids reviewed - check annually The current medical regimen is effective;  continue present plan and medications.        Essential hypertension    BP Readings from Last 3 Encounters:  05/07/14 112/56  11/05/13 138/76  06/27/13 122/72   continue ARB and hctz w/ amlodipine -  Changed low dose atenolol to amlodipine 5mg  12/2011 The current medical regimen is effective;  continue present plan and medications.           Gwendolyn Grant, MD

## 2014-05-07 NOTE — Assessment & Plan Note (Signed)
On metformin + amaryl - home cbgs <150 per pt report Reports working on weight loss thru diet and exercise reminded of importance of lifestyle control -  Also continue ASA 81 and ARB - refuses statin Follows with optho annually and podiatry  Lab Results  Component Value Date   HGBA1C 7.8* 11/05/2013   HGBA1C 7.5* 05/08/2013   HGBA1C 7.3* 01/04/2013

## 2014-05-07 NOTE — Patient Instructions (Addendum)
It was good to see you today.  We have reviewed your prior records including labs and tests today  Test(s) ordered today. Your results will be released to [place] (or called to you) after review, usually within 72hours after test completion. If any changes need to be made, you will be notified at that same time.  Prevnar 13, pneumonia vaccine updated today.  Medications reviewed and updated We have elected to stop statin at this time due to your preference for no statin No other prescription changes recommended at this time. Refill on other medication(s) as discussed today.  Work on lifestyle changes as discussed (low fat, low carb, increased protein diet; improved exercise efforts; weight loss) to control sugar, blood pressure and cholesterol levels and/or reduce risk of developing other medical problems. Look into http://vang.com/ or other type of food journal to assist you in this process..  Please schedule followup in 3-6 months for diabetes mellitus check, call sooner if problems.  Diabetes Mellitus and Food It is important for you to manage your blood sugar (glucose) level. Your blood glucose level can be greatly affected by what you eat. Eating healthier foods in the appropriate amounts throughout the day at about the same time each day will help you control your blood glucose level. It can also help slow or prevent worsening of your diabetes mellitus. Healthy eating may even help you improve the level of your blood pressure and reach or maintain a healthy weight.  HOW CAN FOOD AFFECT ME? Carbohydrates Carbohydrates affect your blood glucose level more than any other type of food. Your dietitian will help you determine how many carbohydrates to eat at each meal and teach you how to count carbohydrates. Counting carbohydrates is important to keep your blood glucose at a healthy level, especially if you are using insulin or taking certain medicines for diabetes mellitus. Alcohol Alcohol  can cause sudden decreases in blood glucose (hypoglycemia), especially if you use insulin or take certain medicines for diabetes mellitus. Hypoglycemia can be a life-threatening condition. Symptoms of hypoglycemia (sleepiness, dizziness, and disorientation) are similar to symptoms of having too much alcohol.  If your health care provider has given you approval to drink alcohol, do so in moderation and use the following guidelines:  Women should not have more than one drink per day, and men should not have more than two drinks per day. One drink is equal to:  12 oz of beer.  5 oz of wine.  1 oz of hard liquor.  Do not drink on an empty stomach.  Keep yourself hydrated. Have water, diet soda, or unsweetened iced tea.  Regular soda, juice, and other mixers might contain a lot of carbohydrates and should be counted. WHAT FOODS ARE NOT RECOMMENDED? As you make food choices, it is important to remember that all foods are not the same. Some foods have fewer nutrients per serving than other foods, even though they might have the same number of calories or carbohydrates. It is difficult to get your body what it needs when you eat foods with fewer nutrients. Examples of foods that you should avoid that are high in calories and carbohydrates but low in nutrients include:  Trans fats (most processed foods list trans fats on the Nutrition Facts label).  Regular soda.  Juice.  Candy.  Sweets, such as cake, pie, doughnuts, and cookies.  Fried foods. WHAT FOODS CAN I EAT? Have nutrient-rich foods, which will nourish your body and keep you healthy. The food you should eat  also will depend on several factors, including:  The calories you need.  The medicines you take.  Your weight.  Your blood glucose level.  Your blood pressure level.  Your cholesterol level. You also should eat a variety of foods, including:  Protein, such as meat, poultry, fish, tofu, nuts, and seeds (lean animal  proteins are best).  Fruits.  Vegetables.  Dairy products, such as milk, cheese, and yogurt (low fat is best).  Breads, grains, pasta, cereal, rice, and beans.  Fats such as olive oil, trans fat-free margarine, canola oil, avocado, and olives. DOES EVERYONE WITH DIABETES MELLITUS HAVE THE SAME MEAL PLAN? Because every person with diabetes mellitus is different, there is not one meal plan that works for everyone. It is very important that you meet with a dietitian who will help you create a meal plan that is just right for you. Document Released: 10/29/2004 Document Revised: 02/06/2013 Document Reviewed: 12/29/2012 [facility] Patient Information 2015 [place], [place]. This information is not intended to replace advice given to you by your health care provider. Make sure you discuss any questions you have with your health care provider.

## 2014-06-27 ENCOUNTER — Ambulatory Visit (INDEPENDENT_AMBULATORY_CARE_PROVIDER_SITE_OTHER): Payer: Medicare Other | Admitting: Internal Medicine

## 2014-06-27 DIAGNOSIS — Z0289 Encounter for other administrative examinations: Secondary | ICD-10-CM

## 2014-06-27 DIAGNOSIS — I10 Essential (primary) hypertension: Secondary | ICD-10-CM

## 2014-06-27 NOTE — Progress Notes (Signed)
Opened in error, pt not seen 

## 2014-06-27 NOTE — Patient Instructions (Signed)
Opened in error, pt not seen 

## 2014-07-18 ENCOUNTER — Emergency Department (HOSPITAL_COMMUNITY)
Admission: EM | Admit: 2014-07-18 | Discharge: 2014-07-18 | Disposition: A | Discharge status: Home / Self Care | Payer: Medicare Other | Source: Home / Self Care | Attending: Family Medicine | Admitting: Family Medicine

## 2014-07-18 ENCOUNTER — Encounter (HOSPITAL_COMMUNITY): Payer: Self-pay | Admitting: Family Medicine

## 2014-07-18 DIAGNOSIS — J309 Allergic rhinitis, unspecified: Secondary | ICD-10-CM

## 2014-07-18 DIAGNOSIS — J019 Acute sinusitis, unspecified: Secondary | ICD-10-CM | POA: Diagnosis not present

## 2014-07-18 MED ORDER — IPRATROPIUM BROMIDE 0.06 % NA SOLN: 2.0000 | Freq: Four times a day (QID) | NASAL | Status: DC

## 2014-07-18 MED ORDER — FLUTICASONE PROPIONATE 50 MCG/ACT NA SUSP: 2.0000 | Freq: Every day | NASAL | Status: DC

## 2014-07-18 NOTE — ED Provider Notes (Signed)
CSN: [id number]     Arrival date & time 07/18/14  1833 History   First MD Initiated Contact with Patient 07/18/14 1857     Chief Complaint  Patient presents with  . Allergies   (Consider location/radiation/quality/duration/timing/severity/associated sxs/prior Treatment) HPI   Sinus congestion fo r 2 weeks. Mucinex w/ improvement. Overall improving.  Associated with cough, intermittent rhinorrhea. Patient states that this happens to her from time to time but typically does not last 2 weeks in duration. Warts occasional environmental allergies. Symptoms are intermittent. Denies productive cough, chest pain, shortness of breath, dyspnea on exertion, palpitations, neck stiffness, fevers, abdominal pain, dysuria, back pain.   Past Medical History  Diagnosis Date  . 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 Hypertension   . Hyperlipidemia   . Diabetes mellitus, type 2   . Allergic urticaria   . Thyroid nodule 05/15/2013    Right side, IR bx 0/63: benign follicular nodule  . 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 Cataract extraction  12/2008  . Vetrectomy  2009   Family History  Problem Relation Age of Onset  . Breast cancer Mother   . Diabetes Other   . Hypertension Other   . Alcohol abuse Other    History  Substance Use Topics  . Smoking status: Never Smoker   . Smokeless tobacco: Not on file  . Alcohol Use: No   OB History    No data available     Review of Systems Per HPI with all other pertinent systems negative.   Allergies  Review of patient's allergies indicates no known allergies.  Home Medications   Prior to Admission medications   Medication Sig Start Date End Date Taking? Authorizing Provider  amLODipine (NORVASC) 10 MG tablet Take 1 tablet (10 mg total) by mouth daily. 05/07/14  Yes Rowe Clack, MD  aspirin EC 325 MG tablet Take 1 tablet (325 mg total) by mouth  daily. 05/08/13  Yes Rowe Clack, MD  glimepiride (AMARYL) 2 MG tablet Take 1 tablet (2 mg total) by mouth daily with breakfast. 05/07/14  Yes Rowe Clack, MD  losartan-hydrochlorothiazide (HYZAAR) 100-25 MG per tablet Take 1 tablet by mouth daily. 05/07/14  Yes Rowe Clack, MD  metFORMIN (GLUCOPHAGE) 1000 MG tablet Take 1 tablet (1,000 mg total) by mouth 2 (two) times daily with a meal. 05/07/14  Yes Rowe Clack, MD  fluticasone (FLONASE) 50 MCG/ACT nasal spray Place 2 sprays into both nostrils at bedtime. 07/18/14   Waldemar Dickens, MD  ipratropium (ATROVENT) 0.06 % nasal spray Place 2 sprays into both nostrils 4 (four) times daily. 07/18/14   Waldemar Dickens, MD   BP 128/74 mmHg  Pulse 77  Temp(Src) 97.7 F (36.5 C) (Oral)  Resp 18  SpO2 97% Physical Exam Physical Exam  Constitutional: oriented to person, place, and time. appears well-developed and well-nourished. No distress.  HENT:  Head: Normocephalic and atraumatic.  Bogginess of turbinates, TMs normal bilaterally, frontal and maxillary sinuses full on palpation per patient but nontender. Eyes: EOMI. PERRL.  Neck: Normal range of motion.  Cardiovascular: RRR, no m/r/g, 2+ distal pulses,  Pulmonary/Chest: Effort normal and breath sounds normal. No respiratory distress.  Abdominal: Soft. Bowel sounds are normal. NonTTP, no distension.  Musculoskeletal: Normal range of motion. Non ttp, no effusion.  Neurological: alert and oriented to person, place, and time.  Skin: Skin is warm. No  rash noted. non diaphoretic.  Psychiatric: normal mood and affect. behavior is normal. Judgment and thought content normal.   ED Course  Procedures (including critical care time) Labs Review Labs Reviewed - No data to display  Imaging Review No results found.   MDM   1. Allergic rhinitis, unspecified allergic rhinitis type   2. Acute sinusitis, recurrence not specified, unspecified location    Atrovent, Flonase, Zyrtec,  nasal saline.    Waldemar Dickens, MD 07/18/14 Einar Crow

## 2014-07-18 NOTE — ED Notes (Signed)
C/o  Head and chest congestion.  Dizziness.  Symptoms present x 2 weeks.  Mild relief with using mucinex.  States seems a little better but symptoms are lingering.   Denies fever, n/v/d.  No headache or chest pain.

## 2014-07-18 NOTE — Discharge Instructions (Signed)
It is unlikely that your symptoms are related to a bacterial infection but are likely related to a mild viral infection or allergies. Please start the nasal Atrovent help with runny nose and postnasal drip, Flonase at night to help open up her nasal and sinus passages, a daily allergy medicine such as Zyrtec or Allegra. Please also consider using nasal saline multiple times per day in order to help clean out her nasal passages. This should improve over the next 1-2 weeks.

## 2014-11-12 ENCOUNTER — Ambulatory Visit: Payer: Medicare Other | Admitting: Internal Medicine

## 2014-11-26 ENCOUNTER — Encounter: Payer: Self-pay | Admitting: Internal Medicine

## 2014-11-26 ENCOUNTER — Other Ambulatory Visit (INDEPENDENT_AMBULATORY_CARE_PROVIDER_SITE_OTHER): Payer: Medicare Other

## 2014-11-26 ENCOUNTER — Ambulatory Visit (INDEPENDENT_AMBULATORY_CARE_PROVIDER_SITE_OTHER): Payer: Medicare Other | Admitting: Internal Medicine

## 2014-11-26 VITALS — BP 110/60 | HR 76 | Temp 98.4°F | Ht 62.5 in | Wt 162.0 lb

## 2014-11-26 DIAGNOSIS — E113599 Type 2 diabetes mellitus with proliferative diabetic retinopathy without macular edema, unspecified eye: Secondary | ICD-10-CM

## 2014-11-26 DIAGNOSIS — K219 Gastro-esophageal reflux disease without esophagitis: Secondary | ICD-10-CM | POA: Diagnosis not present

## 2014-11-26 DIAGNOSIS — E785 Hyperlipidemia, unspecified: Secondary | ICD-10-CM

## 2014-11-26 DIAGNOSIS — I10 Essential (primary) hypertension: Secondary | ICD-10-CM | POA: Diagnosis not present

## 2014-11-26 DIAGNOSIS — E041 Nontoxic single thyroid nodule: Secondary | ICD-10-CM

## 2014-11-26 DIAGNOSIS — Z23 Encounter for immunization: Secondary | ICD-10-CM

## 2014-11-26 LAB — BASIC METABOLIC PANEL
BUN: 27 mg/dL — AB (ref 6–23)
CHLORIDE: 106 meq/L (ref 96–112)
CO2: 27 mEq/L (ref 19–32)
Calcium: 10.3 mg/dL (ref 8.4–10.5)
Creatinine, Ser: 1.11 mg/dL (ref 0.40–1.20)
GFR: 61.93 mL/min (ref >60.00)
Glucose, Bld: 109 mg/dL — ABNORMAL HIGH (ref 70–99)
POTASSIUM: 4.4 meq/L (ref 3.5–5.1)
SODIUM: 142 meq/L (ref 135–145)

## 2014-11-26 LAB — CBC WITH DIFFERENTIAL/PLATELET
BASOS ABS: 0 10*3/uL (ref 0.0–0.1)
Basophils Relative: 0.5 % (ref 0.0–3.0)
EOS PCT: 5.7 % — AB (ref 0.0–5.0)
Eosinophils Absolute: 0.5 10*3/uL (ref 0.0–0.7)
HCT: 36.8 % (ref 36.0–46.0)
Hemoglobin: 12.2 g/dL (ref 12.0–15.0)
Lymphocytes Relative: 40.3 % (ref 12.0–46.0)
Lymphs Abs: 3.5 10*3/uL (ref 0.7–4.0)
MCHC: 33 g/dL (ref 30.0–36.0)
MCV: 92.8 fl (ref 78.0–100.0)
MONO ABS: 0.7 10*3/uL (ref 0.1–1.0)
Monocytes Relative: 8.7 % (ref 3.0–12.0)
NEUTROS PCT: 44.8 % (ref 43.0–77.0)
Neutro Abs: 3.8 10*3/uL (ref 1.4–7.7)
Platelets: 220 10*3/uL (ref 150.0–400.0)
RBC: 3.97 Mil/uL (ref 3.87–5.11)
RDW: 15.5 % (ref 11.5–15.5)
WBC: 8.6 10*3/uL (ref 4.0–10.5)

## 2014-11-26 LAB — LIPID PANEL
CHOLESTEROL: 160 mg/dL (ref 0–200)
HDL: 56.5 mg/dL (ref >39.00)
LDL CALC: 73 mg/dL (ref 0–99)
NONHDL: 103.88
Total CHOL/HDL Ratio: 3
Triglycerides: 155 mg/dL — ABNORMAL HIGH (ref 0.0–149.0)
VLDL: 31 mg/dL (ref 0.0–40.0)

## 2014-11-26 LAB — HEPATIC FUNCTION PANEL
ALK PHOS: 51 U/L (ref 39–117)
ALT: 25 U/L (ref 0–35)
AST: 20 U/L (ref 0–37)
Albumin: 4.1 g/dL (ref 3.5–5.2)
Bilirubin, Direct: 0.1 mg/dL (ref 0.0–0.3)
Total Bilirubin: 0.4 mg/dL (ref 0.2–1.2)
Total Protein: 7.8 g/dL (ref 6.0–8.3)

## 2014-11-26 LAB — HEMOGLOBIN A1C: HEMOGLOBIN A1C: 7.5 % — AB (ref 4.6–6.5)

## 2014-11-26 LAB — TSH: TSH: 1.57 u[IU]/mL (ref 0.35–4.50)

## 2014-11-26 NOTE — Assessment & Plan Note (Signed)
TSH 

## 2014-11-26 NOTE — Assessment & Plan Note (Signed)
CBC

## 2014-11-26 NOTE — Assessment & Plan Note (Signed)
Blood pressure goals reviewed. BMET 

## 2014-11-26 NOTE — Assessment & Plan Note (Signed)
Lipids, LFTs, TSH  

## 2014-11-26 NOTE — Patient Instructions (Signed)
  Your next office appointment will be determined based upon review of your pending labs. Those written interpretation of the lab results and instructions will be transmitted to you by My Chart  Critical results will be called.   Followup as needed for any active or acute issue. Please report any significant change in your symptoms. 

## 2014-11-26 NOTE — Progress Notes (Signed)
Pre visit review using our clinic review tool, if applicable. No additional management support is needed unless otherwise documented below in the visit note. 

## 2014-11-26 NOTE — Addendum Note (Signed)
Addended by: Lowella Dandy on: 11/26/2014 10:47 AM   Modules accepted: Orders

## 2014-11-26 NOTE — Assessment & Plan Note (Signed)
A1c , urine microalbumin, BMET 

## 2014-11-26 NOTE — Progress Notes (Signed)
   Subjective:    Patient ID: Patricia Mccullough, female    DOB: 10-30-1940, 74 y.o.   MRN: [id number]  HPI  The patient is here to assess status of active health conditions.  PMH, FH, & Social History reviewed & updated.No change in [place] as recorded.  She some red meat and fried foods and salts food. She is on the treadmill 20 minutes 3 times a day without cardiopulmonary symptoms. She does not monitor blood pressure at home. Blood pressures are in the 140s-150s.  She seen by the Ophthalmologist every 6 months.  The 5 pound weight loss has been purposeful. Colonoscopy was completed along with endoscopy 08/29/09.She has no active GI symptoms.  She has occasional calf discomfort ,on average once every 2 months. She denies claudication.  She has occasional numbness and tingling in her feet. She has no associated nonhealing skin lesions.   Review of Systems  Chest pain, palpitations, tachycardia, exertional dyspnea, paroxysmal nocturnal dyspnea, claudication or edema are absent. No unexplained weight loss, abdominal pain, significant dyspepsia, dysphagia, melena, rectal bleeding, or persistently small caliber stools. Dysuria, pyuria, hematuria, frequency, nocturia or polyuria are denied. Change in hair, skin, nails denied. No bowel changes of constipation or diarrhea. No intolerance to heat or cold.     Objective:   Physical Exam  Pertinent or positive findings include: She has an upper dental plate and lower partial. Grade 1/2 systolic murmurs present at the right base. She has minimal crepitus of the knees.  General appearance :adequately nourished; in no distress.  Eyes: No conjunctival inflammation or scleral icterus is present.  Oral exam:  Lips and gums are healthy appearing.There is no oropharyngeal erythema or exudate noted. Dental hygiene is good.  Heart:  Normal rate and regular rhythm. S1 and S2 normal without gallop, murmur, click, rub or other extra sounds    Lungs:Chest  clear to auscultation; no wheezes, rhonchi,rales ,or rubs present.No increased work of breathing.   Abdomen: bowel sounds normal, soft and non-tender without masses, organomegaly or hernias noted.  No guarding or rebound. No flank tenderness to percussion.  Vascular : all pulses equal ; no bruits present.  Skin:Warm & dry.  Intact without suspicious lesions or rashes ; no tenting or jaundice   Lymphatic: No lymphadenopathy is noted about the head, neck, axilla, or inguinal areas.   Neuro: Strength, tone & DTRs normal.     Assessment & Plan:  See Current Assessment & Plan in Problem List under specific Diagnosis

## 2014-11-27 LAB — MICROALBUMIN / CREATININE URINE RATIO
Creatinine,U: 157.2 mg/dL
MICROALB/CREAT RATIO: 1.2 mg/g (ref 0.0–30.0)
Microalb, Ur: 1.9 mg/dL (ref 0.0–1.9)

## 2015-02-27 ENCOUNTER — Other Ambulatory Visit: Payer: Self-pay | Admitting: Internal Medicine

## 2015-05-09 ENCOUNTER — Other Ambulatory Visit: Payer: Self-pay | Admitting: Internal Medicine

## 2015-05-13 ENCOUNTER — Other Ambulatory Visit (INDEPENDENT_AMBULATORY_CARE_PROVIDER_SITE_OTHER): Payer: Medicare Other

## 2015-05-13 ENCOUNTER — Encounter: Payer: Self-pay | Admitting: Internal Medicine

## 2015-05-13 ENCOUNTER — Ambulatory Visit (INDEPENDENT_AMBULATORY_CARE_PROVIDER_SITE_OTHER): Payer: Medicare Other | Admitting: Internal Medicine

## 2015-05-13 VITALS — BP 132/70 | HR 78 | Temp 98.1°F | Resp 16 | Wt 164.0 lb

## 2015-05-13 DIAGNOSIS — E113599 Type 2 diabetes mellitus with proliferative diabetic retinopathy without macular edema, unspecified eye: Secondary | ICD-10-CM

## 2015-05-13 DIAGNOSIS — G459 Transient cerebral ischemic attack, unspecified: Secondary | ICD-10-CM | POA: Diagnosis not present

## 2015-05-13 DIAGNOSIS — I10 Essential (primary) hypertension: Secondary | ICD-10-CM

## 2015-05-13 LAB — HEMOGLOBIN A1C: Hgb A1c MFr Bld: 8 % — ABNORMAL HIGH (ref 4.6–6.5)

## 2015-05-13 LAB — COMPREHENSIVE METABOLIC PANEL
ALT: 24 U/L (ref 0–35)
AST: 21 U/L (ref 0–37)
Albumin: 4.1 g/dL (ref 3.5–5.2)
Alkaline Phosphatase: 53 U/L (ref 39–117)
BILIRUBIN TOTAL: 0.4 mg/dL (ref 0.2–1.2)
BUN: 25 mg/dL — ABNORMAL HIGH (ref 6–23)
CALCIUM: 9.6 mg/dL (ref 8.4–10.5)
CO2: 29 mEq/L (ref 19–32)
Chloride: 102 mEq/L (ref 96–112)
Creatinine, Ser: 1.15 mg/dL (ref 0.40–1.20)
GFR: 59.38 mL/min — ABNORMAL LOW (ref >60.00)
Glucose, Bld: 74 mg/dL (ref 70–99)
Potassium: 3.9 mEq/L (ref 3.5–5.1)
Sodium: 138 mEq/L (ref 135–145)
Total Protein: 7.5 g/dL (ref 6.0–8.3)

## 2015-05-13 NOTE — Progress Notes (Signed)
Pre visit review using our clinic review tool, if applicable. No additional management support is needed unless otherwise documented below in the visit note. 

## 2015-05-13 NOTE — Assessment & Plan Note (Signed)
?   TIA in past Continue ASA 325 mg daily Does not want a statin bp controlled

## 2015-05-13 NOTE — Progress Notes (Signed)
Subjective:    Patient ID: Patricia Mccullough, female    DOB: Aug 29, 1940, 75 y.o.   MRN: [id number]  HPI She is here to establish with a new pcp.  She is here for follow up.  Diabetes: She is taking her medication daily as prescribed. She is fairly compliant with a diabetic diet. She is exercising irregularly. She monitors her sugars and they have been running 150-170's. She checks her feet daily and denies foot lesions. She is up-to-date with an ophthalmology examination.   Hypertension: She is taking her medication daily. She is compliant with a low sodium diet.  She denies chest pain, palpitations, edema, shortness of breath and regular headaches. She is exercising irregularly.  She does not monitor her blood pressure at home.    History of TIA: She is taking an aspirin daily. She is not sure if she truly had a TIA - she did have some slurred speech but states it was related to just waking up.  There were no other deficits -  She was evaluated at urgent care.   She would prefer not to take a statin.    Medications and allergies reviewed with patient and updated if appropriate.  Patient Active Problem List   Diagnosis Date Noted  . Dyslipidemia 05/07/2014  . GERD (gastroesophageal reflux disease) 11/05/2013  . Thyroid nodule 05/15/2013  . TIA (transient ischemic attack) 05/08/2013  . Allergic sinusitis 08/31/2012  . MILD NONPROLIFERATIVE DIABETIC RETINOPATHY 02/04/2010  . ALLERGIC URTICARIA 01/20/2009  . Essential hypertension 01/03/2009  . Diabetes mellitus type 2 with retinopathy ([place]) 01/02/2009    Current Outpatient Prescriptions on File Prior to Visit  Medication Sig Dispense Refill  . amLODipine (NORVASC) 10 MG tablet Take 1 tablet (10 mg total) by mouth daily. Yearly physical due in March must see new provider for refills 90 tablet 0  . aspirin EC 325 MG tablet Take 1 tablet (325 mg total) by mouth daily. 100 tablet 3  . fluticasone (FLONASE) 50 MCG/ACT nasal spray Place  2 sprays into both nostrils at bedtime. 16 g 0  . glimepiride (AMARYL) 2 MG tablet TAKE ONE TABLET BY MOUTH ONCE DAILY WITH  BREAKFAST 30 tablet 0  . ipratropium (ATROVENT) 0.06 % nasal spray Place 2 sprays into both nostrils 4 (four) times daily. 15 mL 12  . losartan-hydrochlorothiazide (HYZAAR) 100-25 MG per tablet Take 1 tablet by mouth daily. 90 tablet 3  . metFORMIN (GLUCOPHAGE) 1000 MG tablet Take 1 tablet (1,000 mg total) by mouth 2 (two) times daily with a meal. 180 tablet 2   No current facility-administered medications on file prior to visit.    Past Medical History  Diagnosis Date  . 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 Hypertension   . Hyperlipidemia   . Diabetes mellitus, type 2 ([place])   . Allergic urticaria   . Thyroid nodule 05/15/2013    Right side, IR bx 123456: benign follicular nodule  . 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 Cataract extraction  12/2008  . Vetrectomy  2009    Social History   Social History  . Marital Status: Married    Spouse Name: N/A  . Number of Children: N/A  . Years of Education: N/A   Social History Main Topics  . Smoking status: Never Smoker   . Smokeless tobacco: Not on file  . Alcohol Use: No  . Drug Use:  No  . Sexual Activity: Not on file   Other Topics Concern  . Not on file   Social History Narrative   Married, lives with spouse and their grown son. 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 Breast cancer Mother   . Diabetes Other   . Hypertension Other   . Alcohol abuse Other     Review of Systems  Constitutional: Negative for fever and fatigue.  Respiratory: Negative for cough, shortness of breath and wheezing.   Cardiovascular: Negative for chest pain, palpitations and leg  swelling.  Gastrointestinal: Negative for abdominal pain.       No gerd  Neurological: Positive for numbness (hypersensitive feeling in feet and fingers). Negative for dizziness, light-headedness and headaches.       Objective:   Filed Vitals:   05/13/15 1343  BP: 132/70  Pulse: 78  Temp: 98.1 F (36.7 C)  Resp: 16   Filed Weights   05/13/15 1343  Weight: 164 lb (74.39 kg)   Body mass index is 29.5 kg/(m^2).   Physical Exam Constitutional: Appears well-developed and well-nourished. No distress.  Neck: Neck supple. No tracheal deviation present. No thyromegaly present.  No carotid bruit. No cervical adenopathy.   Cardiovascular: Normal rate, regular rhythm and normal heart sounds.   2/6 systolic murmur.  No edema Pulmonary/Chest: Effort normal and breath sounds normal. No respiratory distress. No wheezes.        Assessment & Plan:   See Problem List for Assessment and Plan of chronic medical problems.   Follow up in 6 months

## 2015-05-13 NOTE — Assessment & Plan Note (Addendum)
BP well controlled Current regimen effective and well tolerated Continue current medications at current doses Check cmp 

## 2015-05-13 NOTE — Patient Instructions (Addendum)

## 2015-05-13 NOTE — Assessment & Plan Note (Signed)
Last a1c 7.5 Increase exercise - needs regular exercise Improve diet Weight loss Continue current meds Check a1c

## 2015-06-03 ENCOUNTER — Other Ambulatory Visit: Payer: Self-pay | Admitting: Internal Medicine

## 2015-06-10 ENCOUNTER — Other Ambulatory Visit: Payer: Self-pay | Admitting: Internal Medicine

## 2015-07-03 ENCOUNTER — Other Ambulatory Visit: Payer: Self-pay

## 2015-07-03 DIAGNOSIS — Z1231 Encounter for screening mammogram for malignant neoplasm of breast: Secondary | ICD-10-CM

## 2015-07-22 ENCOUNTER — Ambulatory Visit: Payer: Medicare Other

## 2015-07-22 ENCOUNTER — Ambulatory Visit
Admission: RE | Admit: 2015-07-22 | Discharge: 2015-07-22 | Disposition: A | Discharge status: Home / Self Care | Payer: Medicare Other | Source: Ambulatory Visit

## 2015-07-22 ENCOUNTER — Other Ambulatory Visit: Payer: Self-pay | Admitting: Internal Medicine

## 2015-07-22 DIAGNOSIS — Z1231 Encounter for screening mammogram for malignant neoplasm of breast: Secondary | ICD-10-CM

## 2015-08-12 ENCOUNTER — Encounter: Payer: Self-pay | Admitting: Internal Medicine

## 2015-08-12 ENCOUNTER — Other Ambulatory Visit (INDEPENDENT_AMBULATORY_CARE_PROVIDER_SITE_OTHER): Payer: Medicare Other

## 2015-08-12 ENCOUNTER — Ambulatory Visit (INDEPENDENT_AMBULATORY_CARE_PROVIDER_SITE_OTHER): Payer: Medicare Other | Admitting: Internal Medicine

## 2015-08-12 VITALS — BP 120/56 | HR 80 | Temp 98.2°F | Resp 16 | Wt 154.0 lb

## 2015-08-12 DIAGNOSIS — I10 Essential (primary) hypertension: Secondary | ICD-10-CM

## 2015-08-12 DIAGNOSIS — E113599 Type 2 diabetes mellitus with proliferative diabetic retinopathy without macular edema, unspecified eye: Secondary | ICD-10-CM | POA: Diagnosis not present

## 2015-08-12 DIAGNOSIS — N189 Chronic kidney disease, unspecified: Secondary | ICD-10-CM

## 2015-08-12 LAB — COMPREHENSIVE METABOLIC PANEL
ALT: 14 U/L (ref 0–35)
AST: 12 U/L (ref 0–37)
Albumin: 4.1 g/dL (ref 3.5–5.2)
Alkaline Phosphatase: 48 U/L (ref 39–117)
BUN: 32 mg/dL — ABNORMAL HIGH (ref 6–23)
CO2: 26 meq/L (ref 19–32)
CREATININE: 1.28 mg/dL — AB (ref 0.40–1.20)
Calcium: 9.7 mg/dL (ref 8.4–10.5)
Chloride: 105 mEq/L (ref 96–112)
GFR: 52.44 mL/min — ABNORMAL LOW (ref >60.00)
Glucose, Bld: 81 mg/dL (ref 70–99)
Potassium: 4.3 mEq/L (ref 3.5–5.1)
Sodium: 138 mEq/L (ref 135–145)
Total Bilirubin: 0.4 mg/dL (ref 0.2–1.2)
Total Protein: 7.5 g/dL (ref 6.0–8.3)

## 2015-08-12 LAB — HEMOGLOBIN A1C: Hgb A1c MFr Bld: 6 % (ref 4.6–6.5)

## 2015-08-12 MED ORDER — LOSARTAN POTASSIUM-HCTZ 100-12.5 MG PO TABS: 1.0000 | ORAL_TABLET | Freq: Every day | ORAL | Status: DC

## 2015-08-12 NOTE — Assessment & Plan Note (Signed)
cmp Decrease hctz to 12.5mg  given BP well controlled

## 2015-08-12 NOTE — Progress Notes (Signed)
Pre visit review using our clinic review tool, if applicable. No additional management support is needed unless otherwise documented below in the visit note. 

## 2015-08-12 NOTE — Assessment & Plan Note (Addendum)
BP on low side here today Continue amlodipine 10 mg daily Decrease losartan-hctz to 100-12.5 mg daily Continue to monitor at home cmp today

## 2015-08-12 NOTE — Assessment & Plan Note (Addendum)
Sugars well controlled at home Continue current medications Check a1c She monitors her sugars closely - no hypoglycemia

## 2015-08-12 NOTE — Patient Instructions (Addendum)
  Test(s) ordered today. Your results will be released to [place] (or called to you) after review, usually within 72hours after test completion. If any changes need to be made, you will be notified at that same time.  Medications reviewed and updated.  Changes include decreasing your blood pressure medication - the new dose is losartan-hctz 100-12.5 mg daily.   Your prescription(s) have been submitted to your pharmacy. Please take as directed and contact our office if you believe you are having problem(s) with the medication(s).   Please followup in September - you already have an appointment scheduled.

## 2015-08-12 NOTE — Progress Notes (Signed)
Subjective:    Patient ID: Patricia Mccullough, female    DOB: 10-Jun-1940, 75 y.o.   MRN: [id number]  HPI She is acute visit for low blood pressure.   She has changed her eating habits.  She is eating less pork and other sugary foods.    Hypertension: She is taking her medication daily. She is compliant with a low sodium diet.  She feels generally weak, which is here main concern - she does not feel that she should have no energy.  She denies chest pain, palpitations, edema, shortness of breath and regular headaches. She is not exercising regularly.  She does monitor her blood pressure at home, since May 4th it has been > 120/? twice, the remainder of the time it has been lower.  It is mostly in the 90/?'s.      Diabetes: She is taking her medication daily as prescribed. She is compliant with a diabetic diet.  She has improved her diet.   She is not exercising regularly. She monitors her sugars and they have been running in the low 100's and always < 140.    Medications and allergies reviewed with patient and updated if appropriate.  Patient Active Problem List   Diagnosis Date Noted  . Dyslipidemia 05/07/2014  . GERD (gastroesophageal reflux disease) 11/05/2013  . Thyroid nodule 05/15/2013  . TIA (transient ischemic attack) 05/08/2013  . Allergic sinusitis 08/31/2012  . MILD NONPROLIFERATIVE DIABETIC RETINOPATHY 02/04/2010  . ALLERGIC URTICARIA 01/20/2009  . Essential hypertension 01/03/2009  . Diabetes mellitus type 2 with retinopathy ([place]) 01/02/2009    Current Outpatient Prescriptions on File Prior to Visit  Medication Sig Dispense Refill  . amLODipine (NORVASC) 10 MG tablet TAKE ONE TABLET BY MOUTH ONCE DAILY. MUST HAVE OFFICE VISIT BEFORE FURTHER REFILLS. 90 tablet 0  . aspirin EC 325 MG tablet Take 1 tablet (325 mg total) by mouth daily. 100 tablet 3  . fluticasone (FLONASE) 50 MCG/ACT nasal spray Place 2 sprays into both nostrils at bedtime. 16 g 0  . glimepiride (AMARYL)  2 MG tablet TAKE ONE TABLET BY MOUTH ONCE DAILY WITH BREAKFAST 90 tablet 1  . ipratropium (ATROVENT) 0.06 % nasal spray Place 2 sprays into both nostrils 4 (four) times daily. 15 mL 12  . losartan-hydrochlorothiazide (HYZAAR) 100-25 MG tablet TAKE ONE TABLET BY MOUTH ONCE DAILY 90 tablet 3  . metFORMIN (GLUCOPHAGE) 1000 MG tablet TAKE ONE TABLET BY MOUTH TWICE DAILY WITH MEALS 180 tablet 1   No current facility-administered medications on file prior to visit.    Past Medical History  Diagnosis Date  . 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 Hypertension   . Hyperlipidemia   . Diabetes mellitus, type 2 ([place])   . Allergic urticaria   . Thyroid nodule 05/15/2013    Right side, IR bx 123456: benign follicular nodule  . 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 Cataract extraction  12/2008  . Vetrectomy  2009    Social History   Social History  . Marital Status: Married    Spouse Name: N/A  . Number of Children: N/A  . Years of Education: N/A   Social History Main Topics  . Smoking status: Never Smoker   . Smokeless tobacco: None  . Alcohol Use: No  . Drug Use: No  . Sexual Activity: Not Asked   Other Topics Concern  . None  Social History Narrative   Married, lives with spouse and their grown son. 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 Breast cancer Mother   . Diabetes Other   . Hypertension Other   . Alcohol abuse Other     Review of Systems  Constitutional: Negative for fever.  Respiratory: Negative for cough, shortness of breath and wheezing.   Cardiovascular: Negative for chest pain, palpitations and leg swelling.  Neurological: Positive for weakness (generalized ). Negative for dizziness, light-headedness and headaches.         Objective:   Filed Vitals:   08/12/15 1447  BP: 120/56  Pulse: 80  Temp: 98.2 F (36.8 C)  Resp: 16   Filed Weights   08/12/15 1447  Weight: 154 lb (69.854 kg)   Body mass index is 27.7 kg/(m^2).   Physical Exam Constitutional: Appears well-developed and well-nourished. No distress.  Neck: Neck supple. No tracheal deviation present. No thyromegaly present.  No carotid bruit. No cervical adenopathy.  nodule in right side of neck - unchanged Cardiovascular: Normal rate, regular rhythm and normal heart sounds.   2/6 systolic murmur heard.  No edema Pulmonary/Chest: Effort normal and breath sounds normal. No respiratory distress. No wheezes.       Assessment & Plan:   See Problem List for Assessment and Plan of chronic medical problems.  Has f/u in september

## 2015-08-15 ENCOUNTER — Encounter: Payer: Self-pay | Admitting: Internal Medicine

## 2015-09-01 ENCOUNTER — Other Ambulatory Visit: Payer: Self-pay | Admitting: Internal Medicine

## 2015-09-02 ENCOUNTER — Other Ambulatory Visit: Payer: Self-pay | Admitting: Internal Medicine

## 2015-11-13 ENCOUNTER — Ambulatory Visit: Payer: Medicare Other | Admitting: Internal Medicine

## 2015-11-26 ENCOUNTER — Ambulatory Visit: Payer: Medicare Other | Admitting: Internal Medicine

## 2015-12-03 ENCOUNTER — Ambulatory Visit (INDEPENDENT_AMBULATORY_CARE_PROVIDER_SITE_OTHER): Payer: Medicare Other | Admitting: Internal Medicine

## 2015-12-03 ENCOUNTER — Encounter: Payer: Self-pay | Admitting: Internal Medicine

## 2015-12-03 ENCOUNTER — Other Ambulatory Visit (INDEPENDENT_AMBULATORY_CARE_PROVIDER_SITE_OTHER): Payer: Medicare Other

## 2015-12-03 VITALS — BP 122/70 | HR 79 | Temp 97.6°F | Resp 16 | Ht 63.0 in | Wt 152.0 lb

## 2015-12-03 DIAGNOSIS — N189 Chronic kidney disease, unspecified: Secondary | ICD-10-CM | POA: Diagnosis not present

## 2015-12-03 DIAGNOSIS — Z23 Encounter for immunization: Secondary | ICD-10-CM

## 2015-12-03 DIAGNOSIS — E041 Nontoxic single thyroid nodule: Secondary | ICD-10-CM

## 2015-12-03 DIAGNOSIS — I10 Essential (primary) hypertension: Secondary | ICD-10-CM

## 2015-12-03 DIAGNOSIS — E113599 Type 2 diabetes mellitus with proliferative diabetic retinopathy without macular edema, unspecified eye: Secondary | ICD-10-CM

## 2015-12-03 LAB — COMPREHENSIVE METABOLIC PANEL
ALBUMIN: 4.5 g/dL (ref 3.5–5.2)
ALT: 13 U/L (ref 0–35)
AST: 13 U/L (ref 0–37)
Alkaline Phosphatase: 58 U/L (ref 39–117)
BILIRUBIN TOTAL: 0.4 mg/dL (ref 0.2–1.2)
BUN: 29 mg/dL — AB (ref 6–23)
CALCIUM: 10.5 mg/dL (ref 8.4–10.5)
CO2: 30 meq/L (ref 19–32)
CREATININE: 1.12 mg/dL (ref 0.40–1.20)
Chloride: 106 mEq/L (ref 96–112)
GFR: 61.12 mL/min (ref >60.00)
Glucose, Bld: 110 mg/dL — ABNORMAL HIGH (ref 70–99)
Potassium: 4.4 mEq/L (ref 3.5–5.1)
SODIUM: 142 meq/L (ref 135–145)
Total Protein: 8 g/dL (ref 6.0–8.3)

## 2015-12-03 LAB — LIPID PANEL
CHOL/HDL RATIO: 2
Cholesterol: 128 mg/dL (ref 0–200)
HDL: 65.7 mg/dL (ref >39.00)
LDL CALC: 41 mg/dL (ref 0–99)
NonHDL: 62.24
TRIGLYCERIDES: 106 mg/dL (ref 0.0–149.0)
VLDL: 21.2 mg/dL (ref 0.0–40.0)

## 2015-12-03 LAB — MICROALBUMIN / CREATININE URINE RATIO
Creatinine,U: 173.8 mg/dL
MICROALB/CREAT RATIO: 1.2 mg/g (ref 0.0–30.0)
Microalb, Ur: 2 mg/dL — ABNORMAL HIGH (ref 0.0–1.9)

## 2015-12-03 LAB — TSH: TSH: 1.46 u[IU]/mL (ref 0.35–4.50)

## 2015-12-03 LAB — HEMOGLOBIN A1C: Hgb A1c MFr Bld: 5.9 % (ref 4.6–6.5)

## 2015-12-03 MED ORDER — LOSARTAN POTASSIUM 100 MG PO TABS: 100.0000 mg | ORAL_TABLET | Freq: Every day | ORAL | 3 refills | Status: DC

## 2015-12-03 MED ORDER — METFORMIN HCL 1000 MG PO TABS: 1000.0000 mg | ORAL_TABLET | Freq: Every day | ORAL | 1 refills | Status: DC

## 2015-12-03 MED ORDER — AMLODIPINE BESYLATE 5 MG PO TABS: 5.0000 mg | ORAL_TABLET | Freq: Every day | ORAL | 3 refills | Status: DC

## 2015-12-03 NOTE — Patient Instructions (Addendum)
  Test(s) ordered today. Your results will be released to [place] (or called to you) after review, usually within 72hours after test completion. If any changes need to be made, you will be notified at that same time.  .  Flu vaccine administered today.   Medications reviewed and updated.  Changes include:  Stop the glimepiride Decrease metformin to 1000 mg once a day Change losartan-hctz 100 - 12.5 mg to plain losartan 100 mg daily (stopping hctz 12.5 mg) Decreasing amlodipine to 5 mg ( from 10 mg )  Your goal BP is on average less than 140/90.  Your prescription(s) have been submitted to your pharmacy. Please take as directed and contact our office if you believe you are having problem(s) with the medication(s).   Please followup in 4-6 weeks

## 2015-12-03 NOTE — Assessment & Plan Note (Addendum)
BP running low at home - she wants to come off of her medication BP on low- normal side Will decrease amlodipine to 5 mg daily Change losartan-hctz 100-12.5 daily to losartan 100 mg daily She will continue to monitor daily - advised her to bring her cuff in here for her next visit cmp

## 2015-12-03 NOTE — Progress Notes (Signed)
Subjective:    Patient ID: Patricia Mccullough, female    DOB: [birth year], 75 y.o.   MRN: [id number]  HPI The patient is here for follow up.  She has been working with someone from [facility] to help get off of her medications.  She wants to get her BP and DM controlled and get off of all her medications.   Diabetes: She is taking her medication daily as prescribed. She is compliant with a diabetic diet. She is exercising regularly, but wants to do more. She monitors her sugars and they have been running 76 on average. She occasionally has a low sugar.  She checks her feet daily and denies foot lesions. She is up-to-date with an ophthalmology examination.   Hypertension: She is taking her medication daily. She is compliant with a low sodium diet.  She denies chest pain, palpitations, edema, shortness of breath and regular headaches. She is exercising regularly.  She does monitor her blood pressure at home - 80-100/ 50-70.     Medications and allergies reviewed with patient and updated if appropriate.  Patient Active Problem List   Diagnosis Date Noted  . CKD (chronic kidney disease) 08/12/2015  . Dyslipidemia 05/07/2014  . GERD (gastroesophageal reflux disease) 11/05/2013  . Thyroid nodule 05/15/2013  . TIA (transient ischemic attack) 05/08/2013  . Allergic sinusitis 08/31/2012  . MILD NONPROLIFERATIVE DIABETIC RETINOPATHY 02/04/2010  . ALLERGIC URTICARIA 01/20/2009  . Essential hypertension 01/03/2009  . Diabetes mellitus type 2 with retinopathy ([place]) 01/02/2009    Current Outpatient Prescriptions on File Prior to Visit  Medication Sig Dispense Refill  . amLODipine (NORVASC) 10 MG tablet Take 1 tablet (10 mg total) by mouth daily. 90 tablet 1  . aspirin EC 325 MG tablet Take 1 tablet (325 mg total) by mouth daily. 100 tablet 3  . fluticasone (FLONASE) 50 MCG/ACT nasal spray Place 2 sprays into both nostrils at bedtime. 16 g 0  . glimepiride (AMARYL) 2 MG tablet TAKE ONE  TABLET BY MOUTH ONCE DAILY WITH BREAKFAST 90 tablet 1  . ipratropium (ATROVENT) 0.06 % nasal spray Place 2 sprays into both nostrils 4 (four) times daily. 15 mL 12  . losartan-hydrochlorothiazide (HYZAAR) 100-12.5 MG tablet Take 1 tablet by mouth daily. 90 tablet 3  . metFORMIN (GLUCOPHAGE) 1000 MG tablet TAKE ONE TABLET BY MOUTH TWICE DAILY WITH MEALS 180 tablet 1   No current facility-administered medications on file prior to visit.     Past Medical History:  Diagnosis Date  . Allergic urticaria   . Diabetes mellitus, type 2 ([place])   . Hyperlipidemia   . Hypertension   . 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 Thyroid nodule 05/15/2013   Right side, IR bx 123456: benign follicular nodule    Past Surgical History:  Procedure Laterality Date  . CATARACT EXTRACTION  12/2008  . Vetrectomy  2009    Social History   Social History  . Marital status: Married    Spouse name: N/A  . Number of children: N/A  . Years of education: N/A   Social History Main Topics  . Smoking status: Never Smoker  . Smokeless tobacco: Not on file  . Alcohol use No  . Drug use: No  . Sexual activity: Not on file   Other Topics Concern  . Not on file   Social History Narrative   Married, lives with spouse and their grown son. 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 Breast cancer Mother   . Diabetes Other   . Hypertension Other   . Alcohol abuse Other     Review of Systems  Constitutional: Negative for fever.  Respiratory: Negative for cough, shortness of breath and wheezing.   Cardiovascular: Negative for chest pain, palpitations and leg swelling.  Neurological: Positive for light-headedness (or shaky when sugar is low). Negative for headaches.       Objective:    Vitals:   12/03/15 1113  BP: 122/70  Pulse: 79  Resp: 16  Temp: 97.6 F (36.4 C)   Filed Weights   12/03/15 1113  Weight: 152 lb (68.9 kg)   Body mass index is 26.93 kg/m.   Physical Exam    Constitutional: Appears well-developed and well-nourished. No distress.  HENT:  Head: Normocephalic and atraumatic.  Neck: Neck supple. No tracheal deviation present. No thyromegaly present.  No cervical lymphadenopathy Cardiovascular: Normal rate, regular rhythm and normal heart sounds.   No murmur heard. No carotid bruit .  No edema Pulmonary/Chest: Effort normal and breath sounds normal. No respiratory distress. No has no wheezes. No rales.  Skin: Skin is warm and dry. Not diaphoretic.  Psychiatric: Normal mood and affect. Behavior is normal.      Assessment & Plan:    See Problem List for Assessment and Plan of chronic medical problems.   F/u in a few weeks since we are making so many changes to her medications

## 2015-12-03 NOTE — Assessment & Plan Note (Signed)
Drinks water throughout day, avoids nsaids cmp today

## 2015-12-03 NOTE — Progress Notes (Signed)
Pre visit review using our clinic review tool, if applicable. No additional management support is needed unless otherwise documented below in the visit note. 

## 2015-12-03 NOTE — Assessment & Plan Note (Signed)
Sugars well controlled at home and low sugars Stop glimepiride Decrease metformin to 1000 mg daily Check a1c

## 2015-12-03 NOTE — Assessment & Plan Note (Signed)
Check tsh 

## 2015-12-09 ENCOUNTER — Encounter: Payer: Self-pay | Admitting: Internal Medicine

## 2015-12-15 ENCOUNTER — Ambulatory Visit: Payer: Medicare Other | Admitting: Internal Medicine

## 2015-12-25 LAB — HM DIABETES EYE EXAM

## 2016-01-01 ENCOUNTER — Encounter: Payer: Self-pay | Admitting: Internal Medicine

## 2016-01-14 ENCOUNTER — Ambulatory Visit (INDEPENDENT_AMBULATORY_CARE_PROVIDER_SITE_OTHER): Payer: Medicare Other | Admitting: Internal Medicine

## 2016-01-14 ENCOUNTER — Encounter: Payer: Self-pay | Admitting: Internal Medicine

## 2016-01-14 ENCOUNTER — Other Ambulatory Visit (INDEPENDENT_AMBULATORY_CARE_PROVIDER_SITE_OTHER): Payer: Medicare Other

## 2016-01-14 VITALS — BP 128/64 | HR 71 | Temp 98.1°F | Resp 16 | Wt 152.0 lb

## 2016-01-14 DIAGNOSIS — I10 Essential (primary) hypertension: Secondary | ICD-10-CM

## 2016-01-14 DIAGNOSIS — R252 Cramp and spasm: Secondary | ICD-10-CM

## 2016-01-14 DIAGNOSIS — E113599 Type 2 diabetes mellitus with proliferative diabetic retinopathy without macular edema, unspecified eye: Secondary | ICD-10-CM

## 2016-01-14 DIAGNOSIS — N189 Chronic kidney disease, unspecified: Secondary | ICD-10-CM | POA: Diagnosis not present

## 2016-01-14 LAB — HEMOGLOBIN A1C: Hgb A1c MFr Bld: 7.1 % — ABNORMAL HIGH (ref 4.6–6.5)

## 2016-01-14 NOTE — Assessment & Plan Note (Signed)
BP well controlled on new regimen Current regimen effective and well tolerated Continue current medications at current doses cmp

## 2016-01-14 NOTE — Progress Notes (Signed)
Subjective:    Patient ID: Patricia Mccullough, female    DOB: June 13, 1940, 75 y.o.   MRN: [id number]  HPI The patient is here for follow up.  With lifestyle changes and weight loss we have been decreasing both her blood pressure and diabetic medication.  One month ago we stopped glimepiride, decreased metformin, stopped her hctz and decreased her amlodipine.  Hypertension: She is taking her medication daily. She is compliant with a low sodium diet.  She denies chest pain, palpitations, edema, shortness of breath and regular headaches.   Diabetes: She is taking her medication daily as prescribed. She is compliant with a diabetic diet. She is exercising regularly, but not as much as she should. She monitors her sugars and they have been running 140-160's.   Leg cramps:  Recently she has been having cramping in her calves in the early morning.  She denies any changes in medication or activity.  She was told to increase her fluids and eat a banana.  She denies cramping during the day and the cramps do not occur nightly.     Medications and allergies reviewed with patient and updated if appropriate.  Patient Active Problem List   Diagnosis Date Noted  . CKD (chronic kidney disease) 08/12/2015  . Dyslipidemia 05/07/2014  . GERD (gastroesophageal reflux disease) 11/05/2013  . Thyroid nodule 05/15/2013  . TIA (transient ischemic attack) 05/08/2013  . Allergic sinusitis 08/31/2012  . MILD NONPROLIFERATIVE DIABETIC RETINOPATHY 02/04/2010  . ALLERGIC URTICARIA 01/20/2009  . Essential hypertension 01/03/2009  . Diabetes mellitus type 2 with retinopathy ([place]) 01/02/2009    Current Outpatient Prescriptions on File Prior to Visit  Medication Sig Dispense Refill  . amLODipine (NORVASC) 5 MG tablet Take 1 tablet (5 mg total) by mouth daily. 90 tablet 3  . aspirin EC 325 MG tablet Take 1 tablet (325 mg total) by mouth daily. 100 tablet 3  . fluticasone (FLONASE) 50 MCG/ACT nasal spray Place 2  sprays into both nostrils at bedtime. 16 g 0  . ipratropium (ATROVENT) 0.06 % nasal spray Place 2 sprays into both nostrils 4 (four) times daily. 15 mL 12  . losartan (COZAAR) 100 MG tablet Take 1 tablet (100 mg total) by mouth daily. 90 tablet 3  . metFORMIN (GLUCOPHAGE) 1000 MG tablet Take 1 tablet (1,000 mg total) by mouth daily with breakfast. 90 tablet 1   No current facility-administered medications on file prior to visit.     Past Medical History:  Diagnosis Date  . Allergic urticaria   . Diabetes mellitus, type 2 ([place])   . Hyperlipidemia   . Hypertension   . 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 Thyroid nodule 05/15/2013   Right side, IR bx 123456: benign follicular nodule    Past Surgical History:  Procedure Laterality Date  . CATARACT EXTRACTION  12/2008  . Vetrectomy  2009    Social History   Social History  . Marital status: Married    Spouse name: N/A  . Number of children: N/A  . Years of education: N/A   Social History Main Topics  . Smoking status: Never Smoker  . Smokeless tobacco: Not on file  . Alcohol use No  . Drug use: No  . Sexual activity: Not on file   Other Topics Concern  . Not on file   Social History Narrative   Married, lives with spouse and their grown son. 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 Breast cancer Mother   . Diabetes Other   . Hypertension Other   . Alcohol abuse Other     Review of Systems  Constitutional: Negative for fever.  HENT: Positive for congestion and voice change.   Respiratory: Negative for cough, shortness of breath and wheezing.   Cardiovascular: Negative for chest pain, palpitations and leg swelling.  Musculoskeletal: Positive for myalgias (cramping in calves for a few days).    Neurological: Negative for light-headedness and headaches.       Objective:   Vitals:   01/14/16 1046  BP: 128/64  Pulse: 71  Resp: 16  Temp: 98.1 F (36.7 C)   Filed Weights   01/14/16 1046  Weight: 152 lb (68.9 kg)   Body mass index is 26.93 kg/m.   Physical Exam    Constitutional: Appears well-developed and well-nourished. No distress.  HENT:  Head: Normocephalic and atraumatic.  Neck: Neck supple. No tracheal deviation present. No thyromegaly present.  No cervical lymphadenopathy Cardiovascular: Normal rate, regular rhythm and normal heart sounds.   No murmur heard. No carotid bruit .  No edema Pulmonary/Chest: Effort normal and breath sounds normal. No respiratory distress. No has no wheezes. No rales.  Skin: Skin is warm and dry. Not diaphoretic.  Psychiatric: Normal mood and affect. Behavior is normal.      Assessment & Plan:    See Problem List for Assessment and Plan of chronic medical problems.

## 2016-01-14 NOTE — Patient Instructions (Addendum)
  Test(s) ordered today. Your results will be released to [place] (or called to you) after review, usually within 72hours after test completion. If any changes need to be made, you will be notified at that same time.   No immunizations administered today.   Medications reviewed and updated.  No changes recommended at this time.   Please followup in 6 months   Leg Cramps Introduction Leg cramps occur when a muscle or muscles tighten and you have no control over this tightening (involuntary muscle contraction). Muscle cramps can develop in any muscle, but the most common place is in the calf muscles of the leg. Those cramps can occur during exercise or when you are at rest. Leg cramps are painful, and they may last for a few seconds to a few minutes. Cramps may return several times before they finally stop. Usually, leg cramps are not caused by a serious medical problem. In many cases, the cause is not known. Some common causes include:  Overexertion.  Overuse from repetitive motions, or doing the same thing over and over.  Remaining in a certain position for a long period of time.  Improper preparation, form, or technique while performing a sport or an activity.  Dehydration.  Injury.  Side effects of some medicines.  Abnormally low levels of the salts and ions in your blood (electrolytes), especially potassium and calcium. These levels could be low if you are taking water pills (diuretics) or if you are pregnant. Follow these instructions at home: Watch your condition for any changes. Taking the following actions may help to lessen any discomfort that you are feeling:  Stay well-hydrated. Drink enough fluid to keep your urine clear or pale yellow.  Try massaging, stretching, and relaxing the affected muscle. Do this for several minutes at a time.  For tight or tense muscles, use a warm towel, heating pad, or hot shower water directed to the affected area.  If you are sore or  have pain after a cramp, applying ice to the affected area may relieve discomfort.  Put ice in a plastic bag.  Place a towel between your skin and the bag.  Leave the ice on for 20 minutes, 2-3 times per day.  Avoid strenuous exercise for several days if you have been having frequent leg cramps.  Make sure that your diet includes the essential minerals for your muscles to work normally.  Take medicines only as directed by your health care provider. Contact a health care provider if:  Your leg cramps get more severe or more frequent, or they do not improve over time.  Your foot becomes cold, numb, or blue. This information is not intended to replace advice given to you by your health care provider. Make sure you discuss any questions you have with your health care provider. Document Released: 03/11/2004 Document Revised: 07/10/2015 Document Reviewed: 01/09/2014  2017 Elsevier

## 2016-01-14 NOTE — Assessment & Plan Note (Signed)
Discussed possible causes Increase fluids Increase activity Can check blood work if no improvement

## 2016-01-14 NOTE — Progress Notes (Signed)
Pre visit review using our clinic review tool, if applicable. No additional management support is needed unless otherwise documented below in the visit note. 

## 2016-01-14 NOTE — Assessment & Plan Note (Signed)
Sugars reasonable controlled Continue current meds Recheck a1c at next visit Continue lifestyle changes

## 2016-01-15 ENCOUNTER — Encounter: Payer: Self-pay | Admitting: Internal Medicine

## 2016-06-17 ENCOUNTER — Other Ambulatory Visit: Payer: Self-pay | Admitting: Internal Medicine

## 2016-06-17 DIAGNOSIS — Z1231 Encounter for screening mammogram for malignant neoplasm of breast: Secondary | ICD-10-CM

## 2016-06-18 ENCOUNTER — Other Ambulatory Visit: Payer: Self-pay | Admitting: Internal Medicine

## 2016-07-13 ENCOUNTER — Ambulatory Visit: Payer: Medicare Other | Admitting: Internal Medicine

## 2016-07-19 NOTE — Progress Notes (Signed)
Subjective:    Patient ID: Patricia Mccullough, female    DOB: 1940/11/22, 76 y.o.   MRN: [id number]  HPI The patient is here for follow up.  Diabetes: She is taking her medication daily as prescribed. She is compliant with a diabetic diet. She is eating more often and probably eating more.  She is not exercising regularly. She monitors her sugars and they have been running 170-180's. She checks her feet daily and denies foot lesions. She is up-to-date with an ophthalmology examination.   Hypertension: She is taking her medication daily. She is compliant with a low sodium diet.  She denies chest pain, palpitations, edema, shortness of breath and regular headaches. She is exercising regularly.  She does monitor her blood pressure at home - 110-120's / 50-70's.  She feels sluggish when her BP is lower.  She gets lower numbers 2/3 of the times.   CKD:  She had decreased kidney function last year, but her kidney function improved with lifestyle changes.   She drink water throughout the day and avoids nsaids.    Medications and allergies reviewed with patient and updated if appropriate.  Patient Active Problem List   Diagnosis Date Noted  . Leg cramps 01/14/2016  . CKD (chronic kidney disease) 08/12/2015  . Dyslipidemia 05/07/2014  . GERD (gastroesophageal reflux disease) 11/05/2013  . Thyroid nodule 05/15/2013  . TIA (transient ischemic attack) 05/08/2013  . Allergic sinusitis 08/31/2012  . MILD NONPROLIFERATIVE DIABETIC RETINOPATHY 02/04/2010  . ALLERGIC URTICARIA 01/20/2009  . Essential hypertension 01/03/2009  . Diabetes mellitus type 2 with retinopathy ([place]) 01/02/2009    Current Outpatient Prescriptions on File Prior to Visit  Medication Sig Dispense Refill  . amLODipine (NORVASC) 5 MG tablet Take 1 tablet (5 mg total) by mouth daily. 90 tablet 3  . aspirin EC 325 MG tablet Take 1 tablet (325 mg total) by mouth daily. 100 tablet 3  . losartan (COZAAR) 100 MG tablet Take 1 tablet  (100 mg total) by mouth daily. 90 tablet 3  . metFORMIN (GLUCOPHAGE) 1000 MG tablet TAKE ONE TABLET BY MOUTH ONCE DAILY WITH BREAKFAST 90 tablet 0   No current facility-administered medications on file prior to visit.     Past Medical History:  Diagnosis Date  . Allergic urticaria   . Diabetes mellitus, type 2 ([place])   . Hyperlipidemia   . Hypertension   . 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 Thyroid nodule 05/15/2013   Right side, IR bx 4/97: benign follicular nodule    Past Surgical History:  Procedure Laterality Date  . CATARACT EXTRACTION  12/2008  . Vetrectomy  2009    Social History   Social History  . Marital status: Married    Spouse name: N/A  . Number of children: N/A  . Years of education: N/A   Social History Main Topics  . Smoking status: Never Smoker  . Smokeless tobacco: Not on file  . Alcohol use No  . Drug use: No  . Sexual activity: Not on file   Other Topics Concern  . Not on file   Social History Narrative   Married, lives with spouse and their grown son. 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 Breast cancer Mother   . Diabetes Other   .  Hypertension Other   . Alcohol abuse Other     Review of Systems  Constitutional: Negative for chills, fatigue and fever.  Respiratory: Negative for cough, shortness of breath and wheezing.   Cardiovascular: Negative for chest pain, palpitations and leg swelling.  Neurological: Negative for light-headedness and headaches.       Objective:   Vitals:   07/20/16 0949  BP: 120/62  Pulse: 72  Resp: 16  Temp: 97.6 F (36.4 C)   Wt Readings from Last 3 Encounters:  07/20/16 159 lb (72.1 kg)  01/14/16 152 lb (68.9 kg)  12/03/15 152 lb (68.9 kg)   Body mass index is 28.17  kg/m.   Physical Exam    Constitutional: Appears well-developed and well-nourished. No distress.  HENT:  Head: Normocephalic and atraumatic.  Neck: Neck supple. No tracheal deviation present. No thyromegaly present.  No cervical lymphadenopathy Cardiovascular: Normal rate, regular rhythm and normal heart sounds.   1/6 systolic murmur heard. No carotid bruit .  No edema Pulmonary/Chest: Effort normal and breath sounds normal. No respiratory distress. No has no wheezes. No rales.  Skin: Skin is warm and dry. Not diaphoretic.  Psychiatric: Normal mood and affect. Behavior is normal.      Assessment & Plan:    See Problem List for Assessment and Plan of chronic medical problems.

## 2016-07-19 NOTE — Patient Instructions (Addendum)
  Test(s) ordered today. Your results will be released to [facility] (or called to you) after review, usually within 72hours after test completion. If any changes need to be made, you will be notified at that same time.  All other Health Maintenance issues reviewed.   All recommended immunizations and age-appropriate screenings are up-to-date or discussed.  No immunizations administered today.   Medications reviewed and updated.  Changes include  Decreasing losartan to 50 mg daily. Continue your other medications.     Please followup in 6 months

## 2016-07-20 ENCOUNTER — Ambulatory Visit (INDEPENDENT_AMBULATORY_CARE_PROVIDER_SITE_OTHER): Payer: Medicare Other | Admitting: Internal Medicine

## 2016-07-20 ENCOUNTER — Encounter: Payer: Self-pay | Admitting: Internal Medicine

## 2016-07-20 ENCOUNTER — Other Ambulatory Visit (INDEPENDENT_AMBULATORY_CARE_PROVIDER_SITE_OTHER): Payer: Medicare Other

## 2016-07-20 VITALS — BP 120/62 | HR 72 | Temp 97.6°F | Resp 16 | Ht 63.0 in | Wt 159.0 lb

## 2016-07-20 DIAGNOSIS — E113599 Type 2 diabetes mellitus with proliferative diabetic retinopathy without macular edema, unspecified eye: Secondary | ICD-10-CM | POA: Diagnosis not present

## 2016-07-20 DIAGNOSIS — E785 Hyperlipidemia, unspecified: Secondary | ICD-10-CM

## 2016-07-20 DIAGNOSIS — E2839 Other primary ovarian failure: Secondary | ICD-10-CM

## 2016-07-20 DIAGNOSIS — R252 Cramp and spasm: Secondary | ICD-10-CM

## 2016-07-20 DIAGNOSIS — I10 Essential (primary) hypertension: Secondary | ICD-10-CM

## 2016-07-20 DIAGNOSIS — N189 Chronic kidney disease, unspecified: Secondary | ICD-10-CM | POA: Diagnosis not present

## 2016-07-20 DIAGNOSIS — Z13820 Encounter for screening for osteoporosis: Secondary | ICD-10-CM | POA: Diagnosis not present

## 2016-07-20 LAB — COMPREHENSIVE METABOLIC PANEL
ALT: 13 U/L (ref 0–35)
AST: 13 U/L (ref 0–37)
Albumin: 4.4 g/dL (ref 3.5–5.2)
Alkaline Phosphatase: 63 U/L (ref 39–117)
BUN: 26 mg/dL — ABNORMAL HIGH (ref 6–23)
CALCIUM: 10.2 mg/dL (ref 8.4–10.5)
CHLORIDE: 103 meq/L (ref 96–112)
CO2: 31 mEq/L (ref 19–32)
Creatinine, Ser: 1.12 mg/dL (ref 0.40–1.20)
GFR: 61.02 mL/min (ref >60.00)
Glucose, Bld: 176 mg/dL — ABNORMAL HIGH (ref 70–99)
Potassium: 4.4 mEq/L (ref 3.5–5.1)
Sodium: 139 mEq/L (ref 135–145)
Total Bilirubin: 0.6 mg/dL (ref 0.2–1.2)
Total Protein: 7.9 g/dL (ref 6.0–8.3)

## 2016-07-20 LAB — HEMOGLOBIN A1C: Hgb A1c MFr Bld: 8.6 % — ABNORMAL HIGH (ref 4.6–6.5)

## 2016-07-20 MED ORDER — LOSARTAN POTASSIUM 100 MG PO TABS: 50.0000 mg | ORAL_TABLET | Freq: Every day | ORAL | 3 refills | Status: DC

## 2016-07-20 NOTE — Assessment & Plan Note (Signed)
Well controlled - low at times Will try decreasing losartan to 50 mg daily Continue amlodipine 5 mg daily Monitor at home

## 2016-07-20 NOTE — Assessment & Plan Note (Signed)
Ate today - unable to check lipids Cholesterol has well controlled

## 2016-07-20 NOTE — Assessment & Plan Note (Signed)
Improved over the past two years Avoid nsaids cmp

## 2016-07-20 NOTE — Assessment & Plan Note (Signed)
Taking potassium, magnesium  - has improved cramping

## 2016-07-20 NOTE — Assessment & Plan Note (Addendum)
Sugars elevated at home Check a1c Does not want to increase metformin Wants to work on lifestyle - increase exercise She will work on decreasing how much she is eating

## 2016-07-22 ENCOUNTER — Encounter: Payer: Self-pay | Admitting: Emergency Medicine

## 2016-07-22 ENCOUNTER — Ambulatory Visit
Admission: RE | Admit: 2016-07-22 | Discharge: 2016-07-22 | Disposition: A | Discharge status: Home / Self Care | Payer: Medicare Other | Source: Ambulatory Visit | Attending: Internal Medicine | Admitting: Internal Medicine

## 2016-07-22 DIAGNOSIS — Z13820 Encounter for screening for osteoporosis: Secondary | ICD-10-CM

## 2016-07-22 DIAGNOSIS — E2839 Other primary ovarian failure: Secondary | ICD-10-CM

## 2016-07-28 ENCOUNTER — Ambulatory Visit
Admission: RE | Admit: 2016-07-28 | Discharge: 2016-07-28 | Disposition: A | Discharge status: Home / Self Care | Payer: Medicare Other | Source: Ambulatory Visit | Attending: Internal Medicine | Admitting: Internal Medicine

## 2016-07-28 DIAGNOSIS — Z1231 Encounter for screening mammogram for malignant neoplasm of breast: Secondary | ICD-10-CM

## 2016-09-05 ENCOUNTER — Other Ambulatory Visit: Payer: Self-pay | Admitting: Internal Medicine

## 2016-10-26 ENCOUNTER — Ambulatory Visit: Payer: Medicare Other | Admitting: Internal Medicine

## 2016-11-15 ENCOUNTER — Other Ambulatory Visit: Payer: Self-pay | Admitting: Internal Medicine

## 2016-11-30 ENCOUNTER — Other Ambulatory Visit: Payer: Self-pay | Admitting: Internal Medicine

## 2017-01-05 ENCOUNTER — Other Ambulatory Visit: Payer: Self-pay | Admitting: Internal Medicine

## 2017-01-05 MED ORDER — METFORMIN HCL 1000 MG PO TABS: 500.0000 mg | ORAL_TABLET | Freq: Two times a day (BID) | ORAL | 0 refills | Status: DC

## 2017-01-16 ENCOUNTER — Other Ambulatory Visit: Payer: Self-pay | Admitting: Internal Medicine

## 2017-01-17 NOTE — Progress Notes (Signed)
Subjective:    Patient ID: Patricia Mccullough, female    DOB: 12/29/1940, 76 y.o.   MRN: [id number]  HPI She is here for a physical exam.   She ran out of her metformin and the pharmacy did not refill - needed a new prescription.  She stopped it and is now just taking cinnamon.  This occurred three weeks ago. She has checked her sugar and it has been as high as 243.  It has been as low as 130's.    She is exercising.  She is compliant with a low sugar/carb diet.  She is trying to keep her portions down.   Her BP is very well controlled at home. She monitors it regularly.   Medications and allergies reviewed with patient and updated if appropriate.  Patient Active Problem List   Diagnosis Date Noted  . Leg cramps 01/14/2016  . CKD (chronic kidney disease) 08/12/2015  . GERD (gastroesophageal reflux disease) 11/05/2013  . Thyroid nodule 05/15/2013  . TIA (transient ischemic attack) 05/08/2013  . MILD NONPROLIFERATIVE DIABETIC RETINOPATHY 02/04/2010  . ALLERGIC URTICARIA 01/20/2009  . Essential hypertension 01/03/2009  . Diabetes mellitus type 2 with retinopathy ([place]) 01/02/2009    Current Outpatient Medications on File Prior to Visit  Medication Sig Dispense Refill  . amLODipine (NORVASC) 5 MG tablet TAKE ONE TABLET BY MOUTH ONCE DAILY 90 tablet 0  . aspirin EC 325 MG tablet Take 1 tablet (325 mg total) by mouth daily. 100 tablet 3  . losartan (COZAAR) 100 MG tablet Take 0.5 tablets (50 mg total) by mouth daily. 90 tablet 3  . metFORMIN (GLUCOPHAGE) 1000 MG tablet Take 0.5 tablets (500 mg total) by mouth 2 (two) times daily with a meal. 180 tablet 0   No current facility-administered medications on file prior to visit.     Past Medical History:  Diagnosis Date  . Allergic urticaria   . Diabetes mellitus, type 2 ([place])   . Hyperlipidemia   . Hypertension   . 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 Thyroid nodule 05/15/2013   Right side, IR bx 1/66: benign follicular nodule    Past Surgical History:  Procedure Laterality Date  . CATARACT EXTRACTION  12/2008  . Vetrectomy  2009    Social History   Socioeconomic History  . Marital status: Married    Spouse name: Not on file  . Number of children: Not on file  . Years of education: Not on file  . Highest education level: Not on file  Social Needs  . Financial resource strain: Not on file  . Food insecurity - worry: Not on file  . Food insecurity - inability: Not on file  . Transportation needs - medical: Not on file  . Transportation needs - non-medical: Not on file  Occupational History  . Not on file  Tobacco Use  . Smoking status: Never Smoker  . Smokeless tobacco: Never Used  Substance and Sexual Activity  . Alcohol use: No  . Drug use: No  . Sexual activity: Not on file  Other Topics Concern  . Not on file  Social History Narrative   Married, lives with spouse and their grown son. 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 Breast cancer Mother   .  Diabetes Other   . Hypertension Other   . Alcohol abuse Other     Review of Systems  Constitutional: Negative for appetite change, chills, fatigue and fever.  Eyes: Negative for visual disturbance.  Respiratory: Negative for cough, shortness of breath and wheezing.   Cardiovascular: Positive for leg swelling (rare with prolonged sitting). Negative for chest pain and palpitations.  Gastrointestinal: Positive for constipation. Negative for abdominal pain, blood in stool, diarrhea and nausea.       No gerd  Genitourinary: Negative for dysuria and hematuria.  Musculoskeletal: Positive for arthralgias. Negative for back pain.  Skin: Negative for color change and rash.       Raised mole  Neurological: Negative for  light-headedness and headaches.  Psychiatric/Behavioral: Negative for dysphoric mood. The patient is not nervous/anxious.        Objective:   Vitals:   01/18/17 0908  BP: (!) 146/80  Pulse: 71  Resp: 16  Temp: 98 F (36.7 C)  SpO2: 91%   Filed Weights   01/18/17 0908  Weight: 153 lb (69.4 kg)   Body mass index is 27.1 kg/m.  Wt Readings from Last 3 Encounters:  01/18/17 153 lb (69.4 kg)  07/20/16 159 lb (72.1 kg)  01/14/16 152 lb (68.9 kg)     Physical Exam Constitutional: She appears well-developed and well-nourished. No distress.  HENT:  Head: Normocephalic and atraumatic.  Right Ear: External ear normal. Normal ear canal and TM Left Ear: External ear normal.  Normal ear canal and TM Mouth/Throat: Oropharynx is clear and moist.  Eyes: Conjunctivae and EOM are normal.  Neck: Neck supple. No tracheal deviation present. No thyromegaly present.  No carotid bruit  Cardiovascular: Normal rate, regular rhythm and normal heart sounds.   2/6 systolic murmur heard.  No edema. Pulmonary/Chest: Effort normal and breath sounds normal. No respiratory distress. She has no wheezes. She has no rales.  Breast: deferred Abdominal: Soft. She exhibits no distension. There is no tenderness.  Lymphadenopathy: She has no cervical adenopathy.  Skin: Skin is warm and dry. She is not diaphoretic.  Psychiatric: She has a normal mood and affect. Her behavior is normal.        Assessment & Plan:   Physical exam: Screening blood work  ordered Immunizations   Discussed shingrix, other vaccines up to date Colonoscopy   Up to date  Mammogram   Up to date  Dexa    Due -  Ordered - will schedule Eye exams   Due this month EKG   None on file Exercise  regular Weight overweight - good for age - has lost some weight Skin    No concerns Substance abuse   none  See Problem List for Assessment and Plan of chronic medical problems.    Fu in 6 months

## 2017-01-18 ENCOUNTER — Ambulatory Visit (INDEPENDENT_AMBULATORY_CARE_PROVIDER_SITE_OTHER): Payer: Medicare Other | Admitting: Internal Medicine

## 2017-01-18 ENCOUNTER — Encounter: Payer: Self-pay | Admitting: Internal Medicine

## 2017-01-18 ENCOUNTER — Other Ambulatory Visit (INDEPENDENT_AMBULATORY_CARE_PROVIDER_SITE_OTHER): Payer: Medicare Other

## 2017-01-18 VITALS — BP 146/80 | HR 71 | Temp 98.0°F | Resp 16 | Ht 63.0 in | Wt 153.0 lb

## 2017-01-18 DIAGNOSIS — Z0000 Encounter for general adult medical examination without abnormal findings: Secondary | ICD-10-CM | POA: Diagnosis not present

## 2017-01-18 DIAGNOSIS — I10 Essential (primary) hypertension: Secondary | ICD-10-CM | POA: Diagnosis not present

## 2017-01-18 DIAGNOSIS — E113599 Type 2 diabetes mellitus with proliferative diabetic retinopathy without macular edema, unspecified eye: Secondary | ICD-10-CM | POA: Diagnosis not present

## 2017-01-18 DIAGNOSIS — N189 Chronic kidney disease, unspecified: Secondary | ICD-10-CM | POA: Diagnosis not present

## 2017-01-18 DIAGNOSIS — E2839 Other primary ovarian failure: Secondary | ICD-10-CM

## 2017-01-18 DIAGNOSIS — G459 Transient cerebral ischemic attack, unspecified: Secondary | ICD-10-CM

## 2017-01-18 LAB — CBC WITH DIFFERENTIAL/PLATELET
BASOS PCT: 0.8 % (ref 0.0–3.0)
Basophils Absolute: 0.1 10*3/uL (ref 0.0–0.1)
EOS PCT: 7.8 % — AB (ref 0.0–5.0)
Eosinophils Absolute: 0.5 10*3/uL (ref 0.0–0.7)
HCT: 39.2 % (ref 36.0–46.0)
Hemoglobin: 12.9 g/dL (ref 12.0–15.0)
LYMPHS ABS: 2.7 10*3/uL (ref 0.7–4.0)
Lymphocytes Relative: 43.5 % (ref 12.0–46.0)
MCHC: 33 g/dL (ref 30.0–36.0)
MCV: 95.7 fl (ref 78.0–100.0)
MONOS PCT: 8.9 % (ref 3.0–12.0)
Monocytes Absolute: 0.6 10*3/uL (ref 0.1–1.0)
NEUTROS ABS: 2.4 10*3/uL (ref 1.4–7.7)
NEUTROS PCT: 39 % — AB (ref 43.0–77.0)
PLATELETS: 208 10*3/uL (ref 150.0–400.0)
RBC: 4.09 Mil/uL (ref 3.87–5.11)
RDW: 15.1 % (ref 11.5–15.5)
WBC: 6.2 10*3/uL (ref 4.0–10.5)

## 2017-01-18 LAB — HEMOGLOBIN A1C: HEMOGLOBIN A1C: 7.6 % — AB (ref 4.6–6.5)

## 2017-01-18 LAB — LIPID PANEL
CHOL/HDL RATIO: 3
Cholesterol: 198 mg/dL (ref 0–200)
HDL: 63.1 mg/dL (ref >39.00)
LDL Cholesterol: 100 mg/dL — ABNORMAL HIGH (ref 0–99)
NONHDL: 134.56
Triglycerides: 171 mg/dL — ABNORMAL HIGH (ref 0.0–149.0)
VLDL: 34.2 mg/dL (ref 0.0–40.0)

## 2017-01-18 LAB — COMPREHENSIVE METABOLIC PANEL
ALT: 12 U/L (ref 0–35)
AST: 12 U/L (ref 0–37)
Albumin: 4.4 g/dL (ref 3.5–5.2)
Alkaline Phosphatase: 60 U/L (ref 39–117)
BUN: 21 mg/dL (ref 6–23)
CO2: 31 meq/L (ref 19–32)
Calcium: 10 mg/dL (ref 8.4–10.5)
Chloride: 104 mEq/L (ref 96–112)
Creatinine, Ser: 0.89 mg/dL (ref 0.40–1.20)
GFR: 79.44 mL/min (ref >60.00)
GLUCOSE: 205 mg/dL — AB (ref 70–99)
POTASSIUM: 4.4 meq/L (ref 3.5–5.1)
Sodium: 140 mEq/L (ref 135–145)
Total Bilirubin: 0.6 mg/dL (ref 0.2–1.2)
Total Protein: 7.9 g/dL (ref 6.0–8.3)

## 2017-01-18 LAB — TSH: TSH: 0.95 u[IU]/mL (ref 0.35–4.50)

## 2017-01-18 MED ORDER — LOSARTAN POTASSIUM 100 MG PO TABS: 50.0000 mg | ORAL_TABLET | Freq: Every day | ORAL | 3 refills | Status: DC

## 2017-01-18 MED ORDER — AMLODIPINE BESYLATE 5 MG PO TABS: 5.0000 mg | ORAL_TABLET | Freq: Every day | ORAL | 3 refills | Status: DC

## 2017-01-18 MED ORDER — METFORMIN HCL 500 MG PO TABS: 500.0000 mg | ORAL_TABLET | Freq: Two times a day (BID) | ORAL | 3 refills | Status: DC

## 2017-01-18 NOTE — Assessment & Plan Note (Addendum)
Sugars high at home 130-243 Restart metformin 500 mg twice a day Continue cinnamon F/u in 6 months - 3 months if sugars remain high

## 2017-01-18 NOTE — Patient Instructions (Addendum)
Your goal BP is < 130/80.  Continue to monitor it at home.  Follow up in 6 months  Continue doing brain stimulating activities (puzzles, reading, adult coloring books, staying active) to keep memory sharp.   Continue to eat heart healthy diet (full of fruits, vegetables, whole grains, lean protein, water--limit salt, fat, and sugar intake) and increase physical activity as tolerated.   Patricia Mccullough , Thank you for taking time to come for your Medicare Wellness Visit. I appreciate your ongoing commitment to your health goals. Please review the following plan we discussed and let me know if I can assist you in the future.   These are the goals we discussed: Goals    . 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        This is a list of the screening recommended for you and due dates:  Health Maintenance  Topic Date Due  . DEXA scan (bone density measurement)  05/25/1940  . Complete foot exam   12/02/2016  . Eye exam for diabetics  12/24/2016  . Hemoglobin A1C  01/19/2017  . Colon Cancer Screening  08/30/2019  . Tetanus Vaccine  11/06/2023  . Flu Shot  Completed  . Pneumonia vaccines  Completed

## 2017-01-18 NOTE — Assessment & Plan Note (Signed)
Not taking a statin - was on simvastatin - no side effects - would prefer not to take Will check lipid panel Continue ASA Advised goal BP < 130/80

## 2017-01-18 NOTE — Assessment & Plan Note (Signed)
A little high here today, but has been controlled at home Discussed goal of < 130/80 ideally She will continue to monitor at home No change in medications cmp

## 2017-01-18 NOTE — Progress Notes (Signed)
Subjective:   Patricia Mccullough is a 76 y.o. female who presents for an Initial Medicare Annual Wellness Visit.  Review of Systems    No ROS.  Medicare Wellness Visit. Additional risk factors are reflected in the social history.  Cardiac Risk Factors include: advanced age (>35men, >38 women);diabetes mellitus;hypertension Sleep patterns: feels rested on waking, gets up 1 times nightly to void and sleeps 6-7 hours nightly.   Home Safety/Smoke Alarms: Feels safe in home. Smoke alarms in place.   Living environment; residence and Firearm Safety: 2-story house, no firearms. Lives with family, no needs for DME, good support system Seat Belt Safety/Bike Helmet: Wears seat belt.     Objective:    Today's Vitals   01/18/17 0908  BP: (!) 146/80  Pulse: 71  Resp: 16  Temp: 98 F (36.7 C)  TempSrc: Oral  SpO2: 91%  Weight: 153 lb (69.4 kg)  Height: 5\' 3"  (1.6 m)   Body mass index is 27.1 kg/m.  Advanced Directives 01/18/2017  Does Patient Have a Medical Advance Directive? No  Would patient like information on creating a medical advance directive? Yes (ED - Information included in AVS)    Current Medications (verified) Outpatient Encounter Medications as of 01/18/2017  Medication Sig  . amLODipine (NORVASC) 5 MG tablet Take 1 tablet (5 mg total) by mouth daily.  Marland Kitchen aspirin EC 325 MG tablet Take 1 tablet (325 mg total) by mouth daily.  Marland Kitchen losartan (COZAAR) 100 MG tablet Take 0.5 tablets (50 mg total) by mouth daily.  . [DISCONTINUED] amLODipine (NORVASC) 5 MG tablet TAKE ONE TABLET BY MOUTH ONCE DAILY  . [DISCONTINUED] losartan (COZAAR) 100 MG tablet Take 0.5 tablets (50 mg total) by mouth daily.  . [DISCONTINUED] metFORMIN (GLUCOPHAGE) 1000 MG tablet Take 0.5 tablets (500 mg total) by mouth 2 (two) times daily with a meal.  . metFORMIN (GLUCOPHAGE) 500 MG tablet Take 1 tablet (500 mg total) by mouth 2 (two) times daily with a meal.   No facility-administered encounter medications  on file as of 01/18/2017.     Allergies (verified) Patient has no known allergies.   History: Past Medical History:  Diagnosis Date  . Allergic urticaria   . Diabetes mellitus, type 2 ([place])   . Hyperlipidemia   . Hypertension   . 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 Thyroid nodule 05/15/2013   Right side, IR bx 7/98: benign follicular nodule   Past Surgical History:  Procedure Laterality Date  . CATARACT EXTRACTION  12/2008  . Vetrectomy  2009   Family History  Problem Relation Age of Onset  . Breast cancer Mother   . Diabetes Other   . Hypertension Other   . Alcohol abuse Other    Social History   Socioeconomic History  . Marital status: Married    Spouse name: None  . Number of children: None  . Years of education: None  . Highest education level: None  Social Needs  . Financial resource strain: None  . Food insecurity - worry: None  . Food insecurity - inability: None  . Transportation needs - medical: None  . Transportation needs - non-medical: None  Occupational History  . None  Tobacco Use  . Smoking status: Never Smoker  . Smokeless tobacco: Never Used  Substance and Sexual Activity  . Alcohol use: No  . Drug use: No  . Sexual activity: None  Other Topics Concern  .  None  Social History Narrative   Married, lives with spouse and their grown son. 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 N  Vision? N  Difficulty concentrating or making decisions? N  Walking or climbing stairs? N  Dressing or bathing? N  Doing errands, shopping? N  Preparing Food and eating ? N  Using the Toilet? N   In the past six months, have you accidently leaked urine? N  Do you have problems with loss of bowel control? N  Managing your Medications? N  Managing your Finances? N  Housekeeping or managing your Housekeeping? N  Some recent data might be hidden    Immunizations and Health Maintenance Immunization History  Administered Date(s) Administered  . Hepatitis A 02/19/2012  . Hepatitis B 02/19/2012  . IPV 02/19/2012  . Influenza Split 12/28/2011  . Influenza, High Dose Seasonal PF 01/04/2013, 11/05/2013, 12/03/2015  . Influenza,inj,Quad PF,6+ Mos 11/26/2014  . Influenza-Unspecified 11/15/2016  . Meningococcal Polysaccharide 02/19/2012  . Pneumococcal Conjugate-13 05/07/2014  . Pneumococcal Polysaccharide-23 12/28/2011  . Td 02/16/2003, 11/05/2013  . Typhoid Parenteral 02/19/2012   Health Maintenance Due  Topic Date Due  . DEXA SCAN  14-Jul-1940  . OPHTHALMOLOGY EXAM  12/24/2016    Patient Care Team: Binnie Rail, MD as PCP - General (Internal Medicine) Celine Mans, MD (Ophthalmology) Renato Shin, MD (Endocrinology) Melissa Montane, MD (Otolaryngology)  Indicate any recent Medical Services you may have received from other than [facility] providers in the past year (date may be approximate).     Assessment:   This is a routine wellness examination for Patricia Mccullough. Physical assessment deferred to PCP.   Hearing/Vision screen Hearing Screening Comments: Able to hear conversational tones w/o difficulty. No issues reported. Passed whisper test Vision Screening Comments: appointment yearly Dr. Burr Medico  Dietary issues and exercise activities discussed: Current Exercise Habits: Home exercise routine, Type of exercise: treadmill;walking, Time (Minutes): 45, Frequency (Times/Week): 3, Weekly Exercise (Minutes/Week): 135, Exercise limited by: None identified  Diet (meal preparation, eat out, water intake, caffeinated beverages, dairy products, fruits and vegetables): in general, a "healthy"  diet  , well balanced   Reviewed heart healthy and diabetic diet, encouraged patient to increase daily water intake.  Goals    . Patient Stated     I would like to start a STEM school in [place] both face to face and via intranet.       Depression Screen PHQ 2/9 Scores 01/18/2017 01/18/2017 12/03/2015 11/05/2013 08/31/2012 05/04/2011  PHQ - 2 Score 0 0 0 1 0 2    Fall Risk Fall Risk  01/18/2017 01/18/2017 12/03/2015 11/05/2013 08/31/2012  Falls in the past year? No No No No No    Cognitive Function: MMSE - Mini Mental State Exam 01/18/2017  Orientation to time 5  Orientation to Place 5  Registration 3  Attention/ Calculation 5  Recall 2  Language- name 2 objects 2  Language- repeat 1  Language- follow 3 step command 3  Language- read & follow direction 1  Write a sentence 1  Copy design 1  Total score 29        Screening Tests Health Maintenance  Topic Date Due  . DEXA SCAN  [birth year]  . OPHTHALMOLOGY EXAM  12/24/2016  . HEMOGLOBIN A1C  01/19/2017  . FOOT EXAM  01/18/2018  . COLONOSCOPY  08/30/2019  . TETANUS/TDAP  11/06/2023  . INFLUENZA VACCINE  Completed  . PNA vac Low Risk Adult  Completed      Plan:    I have personally reviewed and noted the following in the patient's chart:   . Medical and social history . Use of alcohol, tobacco or illicit drugs  . Current medications and supplements . Functional ability and status . Nutritional status . Physical activity . Advanced directives . List of other physicians . Vitals . Screenings to include cognitive, depression, and falls . Referrals and appointments  In addition, I have reviewed and discussed with patient certain preventive protocols, quality metrics, and best practice recommendations. A written personalized care plan for preventive services as well as general preventive health recommendations were provided to patient.     Michiel Cowboy, RN   01/18/2017    Medical screening  examination/treatment/procedure(s) were performed by non-physician practitioner and as supervising physician I was immediately available for consultation/collaboration. I agree with above. Binnie Rail, MD

## 2017-01-18 NOTE — Assessment & Plan Note (Signed)
cmp

## 2017-01-21 ENCOUNTER — Other Ambulatory Visit: Payer: Self-pay | Admitting: Internal Medicine

## 2017-01-21 MED ORDER — EMPAGLIFLOZIN 10 MG PO TABS: 10.0000 mg | ORAL_TABLET | Freq: Every day | ORAL | 5 refills | Status: DC

## 2017-02-01 ENCOUNTER — Other Ambulatory Visit: Payer: Medicare Other

## 2017-02-01 ENCOUNTER — Ambulatory Visit (INDEPENDENT_AMBULATORY_CARE_PROVIDER_SITE_OTHER)
Admission: RE | Admit: 2017-02-01 | Discharge: 2017-02-01 | Disposition: A | Discharge status: Home / Self Care | Payer: Medicare Other | Source: Ambulatory Visit | Attending: Internal Medicine | Admitting: Internal Medicine

## 2017-02-01 DIAGNOSIS — E2839 Other primary ovarian failure: Secondary | ICD-10-CM | POA: Diagnosis not present

## 2017-02-06 ENCOUNTER — Encounter: Payer: Self-pay | Admitting: Internal Medicine

## 2017-04-14 LAB — HM DIABETES EYE EXAM

## 2017-05-10 ENCOUNTER — Encounter: Payer: Self-pay | Admitting: Internal Medicine

## 2017-06-14 ENCOUNTER — Other Ambulatory Visit: Payer: Self-pay | Admitting: Internal Medicine

## 2017-06-14 DIAGNOSIS — Z1231 Encounter for screening mammogram for malignant neoplasm of breast: Secondary | ICD-10-CM

## 2017-07-17 NOTE — Progress Notes (Signed)
Subjective:    Patient ID: Patricia Mccullough, female    DOB: [birth year], 77 y.o.   MRN: [id number]  HPI The patient is here for follow up.  Hypertension: She is taking her medication daily. She is compliant with a low sodium diet.  She denies chest pain, palpitations, edema, shortness of breath and regular headaches. She is exercising regularly, but not as much as she should.  She does monitor her blood pressure at home - 113/70's.    Diabetes: She is taking her metformin daily as prescribed.  We called her with her blood work 6 months ago and her A1c was elevated and she agreed to a second medication-Jardiance was sent to her pharmacy, but she states she never picked it up.  She has been more compliant with a diabetic diet. She is exercising regularly, but not which she should be doing.  She is working on increasing her exercise. She monitors her sugars and they have been running 130-140's, some 150's.  On a rare occasion she will get a sugar in the 200s.  Overall her sugars are better than 6 months ago.  she checks her feet daily and denies foot lesions. She is up-to-date with an ophthalmology examination.  History of TIA: She is taking a full aspirin daily as prescribed.  She was on simvastatin at one point, but this was stopped-she did not have any side effects.  She would prefer not to take medication.  She has occasional sharp pains that last one second. They are intermittent and typically are in the stomach or chest.  She denies any obvious cause - they are not related to any specific activity.  She just wanted to know what the cause was..    Medications and allergies reviewed with patient and updated if appropriate.  Patient Active Problem List   Diagnosis Date Noted  . Leg cramps 01/14/2016  . CKD (chronic kidney disease) 08/12/2015  . Thyroid nodule 05/15/2013  . TIA (transient ischemic attack) 05/08/2013  . MILD NONPROLIFERATIVE DIABETIC RETINOPATHY 02/04/2010  . ALLERGIC  URTICARIA 01/20/2009  . Essential hypertension 01/03/2009  . Diabetes mellitus type 2 with retinopathy ([place]) 01/02/2009    Current Outpatient Medications on File Prior to Visit  Medication Sig Dispense Refill  . amLODipine (NORVASC) 5 MG tablet Take 1 tablet (5 mg total) by mouth daily. 90 tablet 3  . aspirin EC 325 MG tablet Take 1 tablet (325 mg total) by mouth daily. 100 tablet 3  . losartan (COZAAR) 100 MG tablet Take 0.5 tablets (50 mg total) by mouth daily. 45 tablet 3  . metFORMIN (GLUCOPHAGE) 500 MG tablet Take 1 tablet (500 mg total) by mouth 2 (two) times daily with a meal. 180 tablet 3   No current facility-administered medications on file prior to visit.     Past Medical History:  Diagnosis Date  . Allergic urticaria   . Diabetes mellitus, type 2 ([place])   . Hyperlipidemia   . Hypertension   . 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 Thyroid nodule 05/15/2013   Right side, IR bx 6/23: benign follicular nodule    Past Surgical History:  Procedure Laterality Date  . CATARACT EXTRACTION  12/2008  . Vetrectomy  2009    Social History   Socioeconomic History  . Marital status: Married    Spouse name: Not on file  . Number of children: Not on file  . Years  of education: Not on file  . Highest education level: Not on file  Occupational History  . Not on file  Social Needs  . Financial resource strain: Not on file  . Food insecurity:    Worry: Not on file    Inability: Not on file  . Transportation needs:    Medical: Not on file    Non-medical: Not on file  Tobacco Use  . Smoking status: Never Smoker  . Smokeless tobacco: Never Used  Substance and Sexual Activity  . Alcohol use: No  . Drug use: No  . Sexual activity: Not on file  Lifestyle  . Physical activity:    Days per week: Not on file    Minutes per session: Not on file  . Stress: Not on file  Relationships  . Social  connections:    Talks on phone: Not on file    Gets together: Not on file    Attends religious service: Not on file    Active member of club or organization: Not on file    Attends meetings of clubs or organizations: Not on file    Relationship status: Not on file  Other Topics Concern  . Not on file  Social History Narrative   Married, lives with spouse and their grown son. 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 Breast cancer Mother   . Diabetes Other   . Hypertension Other   . Alcohol abuse Other     Review of Systems  Constitutional: Negative for chills and fever.  Respiratory: Negative for cough, shortness of breath and wheezing.   Cardiovascular: Negative for chest pain, palpitations and leg swelling.  Musculoskeletal: Positive for arthralgias.  Neurological: Positive for numbness (occasionally in finger tips or feet). Negative for dizziness, light-headedness and headaches.       Objective:   Vitals:   07/19/17 0944  BP: 130/78  Pulse: 73  Resp: 16  Temp: (!) 97.5 F (36.4 C)  SpO2: 96%   BP Readings from Last 3 Encounters:  07/19/17 130/78  01/18/17 (!) 146/80  07/20/16 120/62   Wt Readings from Last 3 Encounters:  07/19/17 154 lb (69.9 kg)  01/18/17 153 lb (69.4 kg)  07/20/16 159 lb (72.1 kg)   Body mass index is 27.28 kg/m.   Physical Exam    Constitutional: Appears well-developed and well-nourished. No distress.  HENT:  Head: Normocephalic and atraumatic.  Neck: Neck supple. No tracheal deviation present. No thyromegaly present.  No cervical lymphadenopathy Cardiovascular: Normal rate, regular rhythm and normal heart sounds.   No murmur heard. No carotid bruit .  No edema Pulmonary/Chest: Effort normal and breath sounds normal. No respiratory distress. No has no wheezes. No rales.  Skin: Skin is warm  and dry. Not diaphoretic.  Psychiatric: Normal mood and affect. Behavior is normal.      Assessment & Plan:    See Problem List for Assessment and Plan of chronic medical problems.

## 2017-07-17 NOTE — Patient Instructions (Addendum)
  Test(s) ordered today. Your results will be released to [place] [facility] (or called to you) after review, usually within 72hours after test completion. If any changes need to be made, you will be notified at that same time.   Medications reviewed and updated.  Changes include  /  No changes recommended at this time.     Please followup in 6 months

## 2017-07-19 ENCOUNTER — Ambulatory Visit (INDEPENDENT_AMBULATORY_CARE_PROVIDER_SITE_OTHER): Payer: Medicare Other | Admitting: Internal Medicine

## 2017-07-19 ENCOUNTER — Other Ambulatory Visit (INDEPENDENT_AMBULATORY_CARE_PROVIDER_SITE_OTHER): Payer: Medicare Other

## 2017-07-19 ENCOUNTER — Encounter: Payer: Self-pay | Admitting: Internal Medicine

## 2017-07-19 VITALS — BP 130/78 | HR 73 | Temp 97.5°F | Resp 16 | Wt 154.0 lb

## 2017-07-19 DIAGNOSIS — G459 Transient cerebral ischemic attack, unspecified: Secondary | ICD-10-CM | POA: Diagnosis not present

## 2017-07-19 DIAGNOSIS — I10 Essential (primary) hypertension: Secondary | ICD-10-CM | POA: Diagnosis not present

## 2017-07-19 DIAGNOSIS — E113599 Type 2 diabetes mellitus with proliferative diabetic retinopathy without macular edema, unspecified eye: Secondary | ICD-10-CM

## 2017-07-19 DIAGNOSIS — R52 Pain, unspecified: Secondary | ICD-10-CM

## 2017-07-19 LAB — COMPREHENSIVE METABOLIC PANEL
ALT: 10 U/L (ref 0–35)
AST: 11 U/L (ref 0–37)
Albumin: 4.4 g/dL (ref 3.5–5.2)
Alkaline Phosphatase: 65 U/L (ref 39–117)
BILIRUBIN TOTAL: 0.4 mg/dL (ref 0.2–1.2)
BUN: 23 mg/dL (ref 6–23)
CO2: 29 meq/L (ref 19–32)
CREATININE: 0.9 mg/dL (ref 0.40–1.20)
Calcium: 10 mg/dL (ref 8.4–10.5)
Chloride: 104 mEq/L (ref 96–112)
GFR: 78.32 mL/min (ref >60.00)
GLUCOSE: 147 mg/dL — AB (ref 70–99)
Potassium: 4.4 mEq/L (ref 3.5–5.1)
Sodium: 140 mEq/L (ref 135–145)
Total Protein: 7.6 g/dL (ref 6.0–8.3)

## 2017-07-19 LAB — LIPID PANEL
CHOL/HDL RATIO: 3
Cholesterol: 165 mg/dL (ref 0–200)
HDL: 53.4 mg/dL (ref >39.00)
LDL Cholesterol: 74 mg/dL (ref 0–99)
NONHDL: 111.35
Triglycerides: 185 mg/dL — ABNORMAL HIGH (ref 0.0–149.0)
VLDL: 37 mg/dL (ref 0.0–40.0)

## 2017-07-19 LAB — HEMOGLOBIN A1C: HEMOGLOBIN A1C: 7 % — AB (ref 4.6–6.5)

## 2017-07-19 NOTE — Assessment & Plan Note (Signed)
She is experiencing intermittent sharp pain in her chest and abdomen that lasts approximately 1 second each Exam normal Reassured her that this is unlikely to be cardiac or pulmonary in nature-unlikely to be organ related Likely musculoskeletal-nerve related No further evaluation necessary

## 2017-07-19 NOTE — Assessment & Plan Note (Signed)
Continue aspirin 325 mg daily Stop simvastatin-no side effects-prefers not to take Check lipid panel-LDL goal less than 70 Increase exercise

## 2017-07-19 NOTE — Assessment & Plan Note (Signed)
BP well controlled Current regimen effective and well tolerated Continue current medications at current doses cmp  

## 2017-07-19 NOTE — Assessment & Plan Note (Signed)
She did not pick up the Jardiance 6 months ago She has been taking metformin daily as prescribed She has been more compliant with a diabetic diet She is exercising, but not as much as she should be-working on increasing this Would like to avoid additional medication if possible Check A1c today-we will adjust medication if needed Work on increasing exercise Follow-up in 6 months

## 2017-07-20 ENCOUNTER — Encounter: Payer: Self-pay | Admitting: Internal Medicine

## 2017-08-03 ENCOUNTER — Ambulatory Visit
Admission: RE | Admit: 2017-08-03 | Discharge: 2017-08-03 | Disposition: A | Discharge status: Home / Self Care | Payer: Medicare Other | Source: Ambulatory Visit | Attending: Internal Medicine | Admitting: Internal Medicine

## 2017-08-03 DIAGNOSIS — Z1231 Encounter for screening mammogram for malignant neoplasm of breast: Secondary | ICD-10-CM

## 2018-01-18 NOTE — Progress Notes (Signed)
Subjective:    Patient ID: Patricia Mccullough, female    DOB: 27-Jun-1940, 77 y.o.   MRN: [id number]  HPI The patient is here for follow up.  Hypertension: She is taking her amlodipine daily, but has not been taking the losartan.  She is concerned about things she read about it and did not want to have any side effects.. She is compliant with a low sodium diet.  She denies chest pain, palpitations, edema, shortness of breath and regular headaches. She is very active, but not exercising regularly.  She does monitor her blood pressure at home - her BP is 130/70 's or lower even without the losartan.    Diabetes: She is taking her medication daily as prescribed. She is compliant with a diabetic diet. She is very active, but not exercising regularly. She monitors her sugars and they have been running 150-180's, which is higher than she likes.  She is unsure why they are higher.    H/o TIA:  She is taking ASA 325 mg daily.  She is not on a statin at this time, but has been in the past and did not want to take it.    Increased urinary frequency:  She has frequent urination during the day and at night - nocturia 2-3 times a night.  She typically is able to fall back asleep.  She has occasional incontinence of urine.  She has not had any dysuria or hematuria.  She does try to limit her fluids in the evening, but is not always good at it.  Medications and allergies reviewed with patient and updated if appropriate.  Patient Active Problem List   Diagnosis Date Noted  . Sharp pain 07/19/2017  . Leg cramps 01/14/2016  . Thyroid nodule 05/15/2013  . History of TIA (transient ischemic attack) 05/08/2013  . MILD NONPROLIFERATIVE DIABETIC RETINOPATHY 02/04/2010  . ALLERGIC URTICARIA 01/20/2009  . Essential hypertension 01/03/2009  . Diabetes mellitus type 2 with retinopathy ([place]) 01/02/2009    Current Outpatient Medications on File Prior to Visit  Medication Sig Dispense Refill  . amLODipine  (NORVASC) 5 MG tablet Take 1 tablet (5 mg total) by mouth daily. 90 tablet 3  . aspirin EC 325 MG tablet Take 1 tablet (325 mg total) by mouth daily. 100 tablet 3  . metFORMIN (GLUCOPHAGE) 500 MG tablet Take 1 tablet (500 mg total) by mouth 2 (two) times daily with a meal. 180 tablet 3  . losartan (COZAAR) 100 MG tablet Take 0.5 tablets (50 mg total) by mouth daily. 45 tablet 3   No current facility-administered medications on file prior to visit.     Past Medical History:  Diagnosis Date  . Allergic urticaria   . Diabetes mellitus, type 2 ([place])   . Hyperlipidemia   . Hypertension   . 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 Thyroid nodule 05/15/2013   Right side, IR bx 0/35: benign follicular nodule    Past Surgical History:  Procedure Laterality Date  . CATARACT EXTRACTION  12/2008  . Vetrectomy  2009    Social History   Socioeconomic History  . Marital status: Married    Spouse name: Not on file  . Number of children: Not on file  . Years of education: Not on file  . Highest education level: Not on file  Occupational History  . Not on file  Social Needs  . Financial resource strain: Not  hard at all  . Food insecurity:    Worry: Never true    Inability: Never true  . Transportation needs:    Medical: No    Non-medical: No  Tobacco Use  . Smoking status: Never Smoker  . Smokeless tobacco: Never Used  Substance and Sexual Activity  . Alcohol use: No  . Drug use: No  . Sexual activity: Not Currently  Lifestyle  . Physical activity:    Days per week: 0 days    Minutes per session: 0 min  . Stress: Not at all  Relationships  . Social connections:    Talks on phone: More than three times a week    Gets together: More than three times a week    Attends religious service: More than 4 times per year    Active member of club or organization: Yes    Attends meetings of clubs or  organizations: More than 4 times per year    Relationship status: Married  Other Topics Concern  . Not on file  Social History Narrative   Married, lives with spouse and their grown son. 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 Breast cancer Mother   . Diabetes Other   . Hypertension Other   . Alcohol abuse Other     Review of Systems  Constitutional: Negative for chills and fever.  Respiratory: Negative for cough, shortness of breath and wheezing.   Cardiovascular: Negative for chest pain, palpitations and leg swelling.  Genitourinary: Positive for frequency. Negative for dysuria and hematuria.  Neurological: Negative for light-headedness and headaches.       Objective:   Vitals:   01/19/18 1109  BP: 134/72  Pulse: 68  Temp: 98.6 F (37 C)  SpO2: 95%   BP Readings from Last 3 Encounters:  01/19/18 134/72  01/19/18 134/72  07/19/17 130/78   Wt Readings from Last 3 Encounters:  01/19/18 157 lb (71.2 kg)  01/19/18 157 lb (71.2 kg)  07/19/17 154 lb (69.9 kg)   Body mass index is 27.81 kg/m.   Physical Exam    Constitutional: Appears well-developed and well-nourished. No distress.  HENT:  Head: Normocephalic and atraumatic.  Neck: Neck supple.  Right-sided submandibular lump-unchanged.  No tracheal deviation present. No thyromegaly present.  No cervical lymphadenopathy Cardiovascular: Normal rate, regular rhythm and normal heart sounds.   No murmur heard. No carotid bruit .  No edema Pulmonary/Chest: Effort normal and breath sounds normal. No respiratory distress. No has no wheezes. No rales.  Skin: Skin is warm and dry. Not diaphoretic.  Psychiatric: Normal mood and affect. Behavior is normal.      Assessment & Plan:    See Problem List for Assessment and Plan of chronic medical problems.

## 2018-01-18 NOTE — Progress Notes (Addendum)
Subjective:   Patricia Mccullough is a 77 y.o. female who presents for Medicare Annual (Subsequent) preventive examination.  Review of Systems:  No ROS.  Medicare Wellness Visit. Additional risk factors are reflected in the social history.  Cardiac Risk Factors include: advanced age (>22men, >64 women);diabetes mellitus;dyslipidemia;hypertension Sleep patterns: has interrupted sleep, gets up 2-3 times nightly to void and sleeps 6-7 hours nightly.    Home Safety/Smoke Alarms: Feels safe in home. Smoke alarms in place.  Living environment; residence and Firearm Safety: 1-story house/ trailer, no firearms. Lives with family, no needs for DME, good support system Seat Belt Safety/Bike Helmet: Wears seat belt.      Objective:     Vitals: BP 134/72   Pulse 68   Temp 98.6 F (37 C)   Resp 17   Ht 5\' 3"  (1.6 m)   Wt 157 lb (71.2 kg)   SpO2 95%   BMI 27.81 kg/m   Body mass index is 27.81 kg/m.  Advanced Directives 01/19/2018 01/18/2017  Does Patient Have a Medical Advance Directive? No No  Does patient want to make changes to medical advance directive? Yes (ED - Information included in AVS) -  Would patient like information on creating a medical advance directive? - Yes (ED - Information included in AVS)    Tobacco Social History   Tobacco Use  Smoking Status Never Smoker  Smokeless Tobacco Never Used     Counseling given: Not Answered  Past Medical History:  Diagnosis Date  . Allergic urticaria   . Diabetes mellitus, type 2 ([place])   . Hyperlipidemia   . Hypertension   . 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 Thyroid nodule 05/15/2013   Right side, IR bx 6/83: benign follicular nodule   Past Surgical History:  Procedure Laterality Date  . CATARACT EXTRACTION  12/2008  . Vetrectomy  2009   Family History  Problem Relation Age of Onset  . Breast cancer Mother   . Diabetes Other   .  Hypertension Other   . Alcohol abuse Other    Social History   Socioeconomic History  . Marital status: Married    Spouse name: Not on file  . Number of children: 3  . Years of education: Not on file  . Highest education level: Not on file  Occupational History  . Not on file  Social Needs  . Financial resource strain: Not hard at all  . Food insecurity:    Worry: Never true    Inability: Never true  . Transportation needs:    Medical: No    Non-medical: No  Tobacco Use  . Smoking status: Never Smoker  . Smokeless tobacco: Never Used  Substance and Sexual Activity  . Alcohol use: No  . Drug use: No  . Sexual activity: Not Currently  Lifestyle  . Physical activity:    Days per week: 0 days    Minutes per session: 0 min  . Stress: Only a little  Relationships  . Social connections:    Talks on phone: More than three times a week    Gets together: More than three times a week    Attends religious service: More than 4 times per year    Active member of club or organization: Yes    Attends meetings of clubs or organizations: More than 4 times per year    Relationship status: Married  Other Topics Concern  .  Not on file  Social History Narrative   Married, lives with spouse and their grown son. 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 amLODipine (NORVASC) 5 MG tablet Take 1 tablet (5 mg total) by mouth daily.  Marland Kitchen aspirin EC 325 MG tablet Take 1 tablet (325 mg total) by mouth daily.  Marland Kitchen GARLIC-CALCIUM PO Take 1 tablet by mouth daily.  . metFORMIN (GLUCOPHAGE) 500 MG tablet Take 1 tablet (500 mg total) by mouth 2 (two) times daily with a meal.  . [DISCONTINUED] losartan (COZAAR) 100 MG tablet Take 0.5 tablets (50 mg total) by mouth daily.   No facility-administered encounter medications on file as of 01/19/2018.       Activities of Daily Living In your present state of health, do you have any difficulty performing the following activities: 01/19/2018  Hearing? N  Vision? N  Difficulty concentrating or making decisions? N  Walking or climbing stairs? N  Dressing or bathing? N  Doing errands, shopping? N  Preparing Food and eating ? N  Using the Toilet? N  In the past six months, have you accidently leaked urine? N  Do you have problems with loss of bowel control? N  Managing your Medications? N  Managing your Finances? N  Housekeeping or managing your Housekeeping? N  Some recent data might be hidden    Patient Care Team: Binnie Rail, MD as PCP - General (Internal Medicine) Celine Mans, MD (Ophthalmology) Renato Shin, MD (Endocrinology) Melissa Montane, MD (Otolaryngology)    Assessment:   This is a routine wellness examination for Marleen. Physical assessment deferred to PCP.   Exercise Activities and Dietary recommendations Current Exercise Habits: The patient does not participate in regular exercise at present, Exercise limited by: None identified  Diet (meal preparation, eat out, water intake, caffeinated beverages, dairy products, fruits and vegetables): in general, a "healthy" diet  , well balanced   Reviewed heart healthy and diabetic diet. Encouraged patient to increase daily water and healthy fluid intake.  Goals    . 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     . Patient Stated     Continue my journey with writing and take time to relax. Have the cleaning person come twice weekly.       Fall Risk Fall Risk  01/19/2018 01/18/2017 01/18/2017 12/03/2015 11/05/2013  Falls in the past year? 1 No No No No  Number falls in past yr: 0 - - - -  Injury with Fall? 0 - - - -  Follow up Falls prevention discussed - - - -    Depression Screen PHQ 2/9 Scores 01/19/2018 01/18/2017 01/18/2017 12/03/2015  PHQ - 2 Score  1 0 0 0  PHQ- 9 Score 3 - - -     Cognitive Function MMSE - Mini Mental State Exam 01/18/2017  Orientation to time 5  Orientation to Place 5  Registration 3  Attention/ Calculation 5  Recall 2  Language- name 2 objects 2  Language- repeat 1  Language- follow 3 step command 3  Language- read & follow direction 1  Write a sentence 1  Copy design 1  Total score 29       Ad8 score reviewed for issues:  Issues making  decisions: no  Less interest in hobbies / activities: no  Repeats questions, stories (family complaining): no  Trouble using ordinary gadgets (microwave, computer, phone):no  Forgets the month or year: no  Mismanaging finances: no  Remembering appts: no  Daily problems with thinking and/or memory: no Ad8 score is= 0  Immunization History  Administered Date(s) Administered  . Hepatitis A 02/19/2012  . Hepatitis B 02/19/2012  . IPV 02/19/2012  . Influenza Split 12/28/2011  . Influenza, High Dose Seasonal PF 01/04/2013, 11/05/2013, 12/03/2015  . Influenza,inj,Quad PF,6+ Mos 11/26/2014  . Influenza-Unspecified 11/15/2016  . Meningococcal Polysaccharide 02/19/2012  . Pneumococcal Conjugate-13 05/07/2014  . Pneumococcal Polysaccharide-23 12/28/2011  . Td 02/16/2003, 11/05/2013  . Typhoid Parenteral 02/19/2012   Screening Tests Health Maintenance  Topic Date Due  . URINE MICROALBUMIN  12/02/2016  . INFLUENZA VACCINE  09/15/2017  . FOOT EXAM  01/18/2018  . HEMOGLOBIN A1C  01/18/2018  . OPHTHALMOLOGY EXAM  04/14/2018  . DEXA SCAN  02/01/2022  . TETANUS/TDAP  11/06/2023  . PNA vac Low Risk Adult  Completed      Plan:     Continue doing brain stimulating activities (puzzles, reading, adult coloring books, staying active) to keep memory sharp.   Continue to eat heart healthy diet (full of fruits, vegetables, whole grains, lean protein, water--limit salt, fat, and sugar intake) and increase physical activity as tolerated.Influenza Virus Vaccine     I have personally reviewed and noted the following in the patient's chart:   . Medical and social history . Use of alcohol, tobacco or illicit drugs  . Current medications and supplements . Functional ability and status . Nutritional status . Physical activity . Advanced directives . List of other physicians . Vitals . Screenings to include cognitive, depression, and falls . Referrals and appointments  In addition, I have reviewed and discussed with patient certain preventive protocols, quality metrics, and best practice recommendations. A written personalized care plan for preventive services as well as general preventive health recommendations were provided to patient.     Michiel Cowboy, RN  01/19/2018   Medical screening examination/treatment/procedure(s) were performed by non-physician practitioner and as supervising physician I was immediately available for consultation/collaboration. I agree with above. Binnie Rail, MD

## 2018-01-18 NOTE — Patient Instructions (Addendum)
  Tests ordered today. Your results will be released to [place] (or called to you) after review, usually within 72hours after test completion. If any changes need to be made, you will be notified at that same time.   Medications reviewed and updated.  Changes include :   We will officially discontinue the losartan.  Continue to monitor your BP at home.    Your prescription(s) have been submitted to your pharmacy. Please take as directed and contact our office if you believe you are having problem(s) with the medication(s).   Please followup in 6 months

## 2018-01-19 ENCOUNTER — Ambulatory Visit (INDEPENDENT_AMBULATORY_CARE_PROVIDER_SITE_OTHER): Payer: Medicare Other | Admitting: *Deleted

## 2018-01-19 ENCOUNTER — Ambulatory Visit: Payer: Medicare Other | Admitting: Internal Medicine

## 2018-01-19 ENCOUNTER — Other Ambulatory Visit (INDEPENDENT_AMBULATORY_CARE_PROVIDER_SITE_OTHER): Payer: Medicare Other

## 2018-01-19 ENCOUNTER — Encounter: Payer: Self-pay | Admitting: Internal Medicine

## 2018-01-19 VITALS — BP 134/72 | HR 68 | Temp 98.6°F | Ht 63.0 in | Wt 157.0 lb

## 2018-01-19 VITALS — BP 134/72 | HR 68 | Temp 98.6°F | Resp 17 | Ht 63.0 in | Wt 157.0 lb

## 2018-01-19 DIAGNOSIS — E113599 Type 2 diabetes mellitus with proliferative diabetic retinopathy without macular edema, unspecified eye: Secondary | ICD-10-CM

## 2018-01-19 DIAGNOSIS — R350 Frequency of micturition: Secondary | ICD-10-CM | POA: Diagnosis not present

## 2018-01-19 DIAGNOSIS — R609 Edema, unspecified: Secondary | ICD-10-CM | POA: Insufficient documentation

## 2018-01-19 DIAGNOSIS — Z23 Encounter for immunization: Secondary | ICD-10-CM

## 2018-01-19 DIAGNOSIS — R600 Localized edema: Secondary | ICD-10-CM | POA: Insufficient documentation

## 2018-01-19 DIAGNOSIS — I10 Essential (primary) hypertension: Secondary | ICD-10-CM

## 2018-01-19 DIAGNOSIS — Z0000 Encounter for general adult medical examination without abnormal findings: Secondary | ICD-10-CM

## 2018-01-19 DIAGNOSIS — Z8673 Personal history of transient ischemic attack (TIA), and cerebral infarction without residual deficits: Secondary | ICD-10-CM

## 2018-01-19 LAB — URINALYSIS, ROUTINE W REFLEX MICROSCOPIC
BILIRUBIN URINE: NEGATIVE
Hgb urine dipstick: NEGATIVE
Ketones, ur: NEGATIVE
Nitrite: NEGATIVE
RBC / HPF: NONE SEEN (ref 0–?)
Specific Gravity, Urine: <=1.005 — AB (ref 1.000–1.030)
Total Protein, Urine: NEGATIVE
Urine Glucose: NEGATIVE
Urobilinogen, UA: 0.2 (ref 0.0–1.0)
pH: 7 (ref 5.0–8.0)

## 2018-01-19 LAB — COMPREHENSIVE METABOLIC PANEL
ALT: 17 U/L (ref 0–35)
AST: 14 U/L (ref 0–37)
Albumin: 4.5 g/dL (ref 3.5–5.2)
Alkaline Phosphatase: 73 U/L (ref 39–117)
BILIRUBIN TOTAL: 0.3 mg/dL (ref 0.2–1.2)
BUN: 17 mg/dL (ref 6–23)
CO2: 32 mEq/L (ref 19–32)
Calcium: 10.7 mg/dL — ABNORMAL HIGH (ref 8.4–10.5)
Chloride: 102 mEq/L (ref 96–112)
Creatinine, Ser: 0.92 mg/dL (ref 0.40–1.20)
GFR: 76.26 mL/min (ref >60.00)
GLUCOSE: 124 mg/dL — AB (ref 70–99)
Potassium: 4.4 mEq/L (ref 3.5–5.1)
Sodium: 139 mEq/L (ref 135–145)
Total Protein: 8.2 g/dL (ref 6.0–8.3)

## 2018-01-19 LAB — LIPID PANEL
Cholesterol: 195 mg/dL (ref 0–200)
HDL: 61.7 mg/dL (ref >39.00)
NonHDL: 133.12
Total CHOL/HDL Ratio: 3
Triglycerides: 201 mg/dL — ABNORMAL HIGH (ref 0.0–149.0)
VLDL: 40.2 mg/dL — ABNORMAL HIGH (ref 0.0–40.0)

## 2018-01-19 LAB — MICROALBUMIN / CREATININE URINE RATIO
Creatinine,U: 17.5 mg/dL
Microalb Creat Ratio: 17.3 mg/g (ref 0.0–30.0)
Microalb, Ur: 3 mg/dL — ABNORMAL HIGH (ref 0.0–1.9)

## 2018-01-19 LAB — HEMOGLOBIN A1C: Hgb A1c MFr Bld: 7.5 % — ABNORMAL HIGH (ref 4.6–6.5)

## 2018-01-19 LAB — LDL CHOLESTEROL, DIRECT: Direct LDL: 40 mg/dL

## 2018-01-19 NOTE — Assessment & Plan Note (Signed)
Urinary frequency during the day and at night with 2-3 times at night No other concerning urinary symptoms Will check UA, urine culture ?  Overactive bladder Limit fluids in the evening Can consider neurology referral Has incontinence at times-may also benefit from pelvic PT Discussed medications and some of their possible side effects

## 2018-01-19 NOTE — Patient Instructions (Addendum)
Continue doing brain stimulating activities (puzzles, reading, adult coloring books, staying active) to keep memory sharp.   Continue to eat heart healthy diet (full of fruits, vegetables, whole grains, lean protein, water--limit salt, fat, and sugar intake) and increase physical activity as tolerated.Influenza Virus Vaccine    Patricia Mccullough , Thank you for taking time to come for your Medicare Wellness Visit. I appreciate your ongoing commitment to your health goals. Please review the following plan we discussed and let me know if I can assist you in the future.   These are the goals we discussed: Goals    . 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     . Patient Stated     Continue my journey with writing and takie time to relax. Have the cleaning person come twice weekly.       This is a list of the screening recommended for you and due dates:  Health Maintenance  Topic Date Due  . Flu Shot  09/15/2017  . Complete foot exam   01/18/2018  . Hemoglobin A1C  01/18/2018  . Eye exam for diabetics  04/14/2018  . DEXA scan (bone density measurement)  02/01/2022  . Tetanus Vaccine  11/06/2023  . Pneumonia vaccines  Completed     injection What is this medicine? INFLUENZA VIRUS VACCINE (in floo EN zuh VAHY ruhs vak SEEN) helps to reduce the risk of getting influenza also known as the flu. The vaccine only helps protect you against some strains of the flu. This medicine may be used for other purposes; ask your health care provider or pharmacist if you have questions. COMMON BRAND NAME(S): Afluria, Agriflu, Alfuria, FLUAD, Fluarix, Fluarix Quadrivalent, Flublok, Flublok Quadrivalent, FLUCELVAX, Flulaval, Fluvirin, Fluzone, Fluzone High-Dose, Fluzone Intradermal What should I tell my health care provider before I take this medicine? They need to know if you have any of these conditions: -bleeding disorder like  hemophilia -fever or infection -Guillain-Barre syndrome or other neurological problems -immune system problems -infection with the human immunodeficiency virus (HIV) or AIDS -low blood platelet counts -multiple sclerosis -an unusual or allergic reaction to influenza virus vaccine, latex, other medicines, foods, dyes, or preservatives. Different brands of vaccines contain different allergens. Some may contain latex or eggs. Talk to your doctor about your allergies to make sure that you get the right vaccine. -pregnant or trying to get pregnant -breast-feeding How should I use this medicine? This vaccine is for injection into a muscle or under the skin. It is given by a health care professional. A copy of Vaccine Information Statements will be given before each vaccination. Read this sheet carefully each time. The sheet may change frequently. Talk to your healthcare provider to see which vaccines are right for you. Some vaccines should not be used in all age groups. Overdosage: If you think you have taken too much of this medicine contact a poison control center or emergency room at once. NOTE: This medicine is only for you. Do not share this medicine with others. What if I miss a dose? This does not apply. What may interact with this medicine? -chemotherapy or radiation therapy -medicines that lower your immune system like etanercept, anakinra, infliximab, and adalimumab -medicines that treat or prevent blood clots like warfarin -phenytoin -steroid medicines like prednisone or cortisone -theophylline -vaccines This list may not describe all possible interactions. Give your health care provider a list of all  the medicines, herbs, non-prescription drugs, or dietary supplements you use. Also tell them if you smoke, drink alcohol, or use illegal drugs. Some items may interact with your medicine. What should I watch for while using this medicine? Report any side effects that do not go away  within 3 days to your doctor or health care professional. Call your health care provider if any unusual symptoms occur within 6 weeks of receiving this vaccine. You may still catch the flu, but the illness is not usually as bad. You cannot get the flu from the vaccine. The vaccine will not protect against colds or other illnesses that may cause fever. The vaccine is needed every year. What side effects may I notice from receiving this medicine? Side effects that you should report to your doctor or health care professional as soon as possible: -allergic reactions like skin rash, itching or hives, swelling of the face, lips, or tongue Side effects that usually do not require medical attention (report to your doctor or health care professional if they continue or are bothersome): -fever -headache -muscle aches and pains -pain, tenderness, redness, or swelling at the injection site -tiredness This list may not describe all possible side effects. Call your doctor for medical advice about side effects. You may report side effects to FDA at 1-800-FDA-1088. Where should I keep my medicine? The vaccine will be given by a health care professional in a clinic, pharmacy, doctor's office, or other health care setting. You will not be given vaccine doses to store at home. NOTE: This sheet is a summary. It may not cover all possible information. If you have questions about this medicine, talk to your doctor, pharmacist, or health care provider.  2018 Elsevier/Gold Standard (2014-08-23 10:07:28)     Health Maintenance, Female Adopting a healthy lifestyle and getting preventive care can go a long way to promote health and wellness. Talk with your health care provider about what schedule of regular examinations is right for you. This is a good chance for you to check in with your provider about disease prevention and staying healthy. In between checkups, there are plenty of things you can do on your own.  Experts have done a lot of research about which lifestyle changes and preventive measures are most likely to keep you healthy. Ask your health care provider for more information. Weight and diet Eat a healthy diet  Be sure to include plenty of vegetables, fruits, low-fat dairy products, and lean protein.  Do not eat a lot of foods high in solid fats, added sugars, or salt.  Get regular exercise. This is one of the most important things you can do for your health. ? Most adults should exercise for at least 150 minutes each week. The exercise should increase your heart rate and make you sweat (moderate-intensity exercise). ? Most adults should also do strengthening exercises at least twice a week. This is in addition to the moderate-intensity exercise.  Maintain a healthy weight  Body mass index (BMI) is a measurement that can be used to identify possible weight problems. It estimates body fat based on height and weight. Your health care provider can help determine your BMI and help you achieve or maintain a healthy weight.  For females 4 years of age and older: ? A BMI below 18.5 is considered underweight. ? A BMI of 18.5 to 24.9 is normal. ? A BMI of 25 to 29.9 is considered overweight. ? A BMI of 30 and above is considered obese.  Watch levels of cholesterol and blood lipids  You should start having your blood tested for lipids and cholesterol at 76 years of age, then have this test every 5 years.  You may need to have your cholesterol levels checked more often if: ? Your lipid or cholesterol levels are high. ? You are older than 76 years of age. ? You are at high risk for heart disease.  Cancer screening Lung Cancer  Lung cancer screening is recommended for adults 7-11 years old who are at high risk for lung cancer because of a history of smoking.  A yearly low-dose CT scan of the lungs is recommended for people who: ? Currently smoke. ? Have quit within the past 15  years. ? Have at least a 30-pack-year history of smoking. A pack year is smoking an average of one pack of cigarettes a day for 1 year.  Yearly screening should continue until it has been 15 years since you quit.  Yearly screening should stop if you develop a health problem that would prevent you from having lung cancer treatment.  Breast Cancer  Practice breast self-awareness. This means understanding how your breasts normally appear and feel.  It also means doing regular breast self-exams. Let your health care provider know about any changes, no matter how small.  If you are in your 20s or 30s, you should have a clinical breast exam (CBE) by a health care provider every 1-3 years as part of a regular health exam.  If you are 5 or older, have a CBE every year. Also consider having a breast X-ray (mammogram) every year.  If you have a family history of breast cancer, talk to your health care provider about genetic screening.  If you are at high risk for breast cancer, talk to your health care provider about having an MRI and a mammogram every year.  Breast cancer gene (BRCA) assessment is recommended for women who have family members with BRCA-related cancers. BRCA-related cancers include: ? Breast. ? Ovarian. ? Tubal. ? Peritoneal cancers.  Results of the assessment will determine the need for genetic counseling and BRCA1 and BRCA2 testing.  Cervical Cancer Your health care provider may recommend that you be screened regularly for cancer of the pelvic organs (ovaries, uterus, and vagina). This screening involves a pelvic examination, including checking for microscopic changes to the surface of your cervix (Pap test). You may be encouraged to have this screening done every 3 years, beginning at age 4.  For women ages 18-65, health care providers may recommend pelvic exams and Pap testing every 3 years, or they may recommend the Pap and pelvic exam, combined with testing for human  papilloma virus (HPV), every 5 years. Some types of HPV increase your risk of cervical cancer. Testing for HPV may also be done on women of any age with unclear Pap test results.  Other health care providers may not recommend any screening for nonpregnant women who are considered low risk for pelvic cancer and who do not have symptoms. Ask your health care provider if a screening pelvic exam is right for you.  If you have had past treatment for cervical cancer or a condition that could lead to cancer, you need Pap tests and screening for cancer for at least 20 years after your treatment. If Pap tests have been discontinued, your risk factors (such as having a new sexual partner) need to be reassessed to determine if screening should resume. Some women have medical problems that increase  the chance of getting cervical cancer. In these cases, your health care provider may recommend more frequent screening and Pap tests.  Colorectal Cancer  This type of cancer can be detected and often prevented.  Routine colorectal cancer screening usually begins at 76 years of age and continues through 76 years of age.  Your health care provider may recommend screening at an earlier age if you have risk factors for colon cancer.  Your health care provider may also recommend using home test kits to check for hidden blood in the stool.  A small camera at the end of a tube can be used to examine your colon directly (sigmoidoscopy or colonoscopy). This is done to check for the earliest forms of colorectal cancer.  Routine screening usually begins at age 16.  Direct examination of the colon should be repeated every 5-10 years through 76 years of age. However, you may need to be screened more often if early forms of precancerous polyps or small growths are found.  Skin Cancer  Check your skin from head to toe regularly.  Tell your health care provider about any new moles or changes in moles, especially if there is  a change in a mole's shape or color.  Also tell your health care provider if you have a mole that is larger than the size of a pencil eraser.  Always use sunscreen. Apply sunscreen liberally and repeatedly throughout the day.  Protect yourself by wearing long sleeves, pants, a wide-brimmed hat, and sunglasses whenever you are outside.  Heart disease, diabetes, and high blood pressure  High blood pressure causes heart disease and increases the risk of stroke. High blood pressure is more likely to develop in: ? People who have blood pressure in the high end of the normal range (130-139/85-89 mm Hg). ? People who are overweight or obese. ? People who are African American.  If you are 70-50 years of age, have your blood pressure checked every 3-5 years. If you are 52 years of age or older, have your blood pressure checked every year. You should have your blood pressure measured twice-once when you are at a hospital or clinic, and once when you are not at a hospital or clinic. Record the average of the two measurements. To check your blood pressure when you are not at a hospital or clinic, you can use: ? An automated blood pressure machine at a pharmacy. ? A home blood pressure monitor.  If you are between 11 years and 40 years old, ask your health care provider if you should take aspirin to prevent strokes.  Have regular diabetes screenings. This involves taking a blood sample to check your fasting blood sugar level. ? If you are at a normal weight and have a low risk for diabetes, have this test once every three years after 76 years of age. ? If you are overweight and have a high risk for diabetes, consider being tested at a younger age or more often. Preventing infection Hepatitis B  If you have a higher risk for hepatitis B, you should be screened for this virus. You are considered at high risk for hepatitis B if: ? You were born in a country where hepatitis B is common. Ask your health  care provider which countries are considered high risk. ? Your parents were born in a high-risk country, and you have not been immunized against hepatitis B (hepatitis B vaccine). ? You have HIV or AIDS. ? You use needles to inject street  drugs. ? You live with someone who has hepatitis B. ? You have had sex with someone who has hepatitis B. ? You get hemodialysis treatment. ? You take certain medicines for conditions, including cancer, organ transplantation, and autoimmune conditions.  Hepatitis C  Blood testing is recommended for: ? Everyone born from [birth year] through 1964. ? Anyone with known risk factors for hepatitis C.  Sexually transmitted infections (STIs)  You should be screened for sexually transmitted infections (STIs) including gonorrhea and chlamydia if: ? You are sexually active and are younger than 76 years of age. ? You are older than 76 years of age and your health care provider tells you that you are at risk for this type of infection. ? Your sexual activity has changed since you were last screened and you are at an increased risk for chlamydia or gonorrhea. Ask your health care provider if you are at risk.  If you do not have HIV, but are at risk, it may be recommended that you take a prescription medicine daily to prevent HIV infection. This is called pre-exposure prophylaxis (PrEP). You are considered at risk if: ? You are sexually active and do not regularly use condoms or know the HIV status of your partner(s). ? You take drugs by injection. ? You are sexually active with a partner who has HIV.  Talk with your health care provider about whether you are at high risk of being infected with HIV. If you choose to begin PrEP, you should first be tested for HIV. You should then be tested every 3 months for as long as you are taking PrEP. Pregnancy  If you are premenopausal and you may become pregnant, ask your health care provider about preconception counseling.  If you  may become pregnant, take 400 to 800 micrograms (mcg) of folic acid every day.  If you want to prevent pregnancy, talk to your health care provider about birth control (contraception). Osteoporosis and menopause  Osteoporosis is a disease in which the bones lose minerals and strength with aging. This can result in serious bone fractures. Your risk for osteoporosis can be identified using a bone density scan.  If you are 50 years of age or older, or if you are at risk for osteoporosis and fractures, ask your health care provider if you should be screened.  Ask your health care provider whether you should take a calcium or vitamin D supplement to lower your risk for osteoporosis.  Menopause may have certain physical symptoms and risks.  Hormone replacement therapy may reduce some of these symptoms and risks. Talk to your health care provider about whether hormone replacement therapy is right for you. Follow these instructions at home:  Schedule regular health, dental, and eye exams.  Stay current with your immunizations.  Do not use any tobacco products including cigarettes, chewing tobacco, or electronic cigarettes.  If you are pregnant, do not drink alcohol.  If you are breastfeeding, limit how much and how often you drink alcohol.  Limit alcohol intake to no more than 1 drink per day for nonpregnant women. One drink equals 12 ounces of beer, 5 ounces of Hersel Mcmeen, or 1 ounces of hard liquor.  Do not use street drugs.  Do not share needles.  Ask your health care provider for help if you need support or information about quitting drugs.  Tell your health care provider if you often feel depressed.  Tell your health care provider if you have ever been abused or do not feel  safe at home. This information is not intended to replace advice given to you by your health care provider. Make sure you discuss any questions you have with your health care provider. Document Released: 08/17/2010  Document Revised: 07/10/2015 Document Reviewed: 11/05/2014 Elsevier Interactive Patient Education  [employer].

## 2018-01-19 NOTE — Assessment & Plan Note (Signed)
Blood pressure well controlled here and at home She is no longer taking losartan and we will officially discontinue Continue amlodipine 5 mg daily She will continue to monitor her blood pressure at home CMP

## 2018-01-19 NOTE — Assessment & Plan Note (Signed)
Continue aspirin 325 mg daily Was on a statin, but did not want to take it Check lipid panel-should ideally be restarted on a statin

## 2018-01-19 NOTE — Assessment & Plan Note (Signed)
She is compliant with a diabetic diet Active, but not exercising regularly Sugars 150-180s-higher than ideal Check A1c today, urine microalbumin Advised her to reevaluate if she is eating more carbs or sugars Encouraged more regular exercise

## 2018-01-20 LAB — URINE CULTURE
MICRO NUMBER: 91457863
SPECIMEN QUALITY:: ADEQUATE

## 2018-01-24 ENCOUNTER — Other Ambulatory Visit: Payer: Self-pay | Admitting: Internal Medicine

## 2018-01-26 ENCOUNTER — Other Ambulatory Visit: Payer: Self-pay | Admitting: Internal Medicine

## 2018-01-26 MED ORDER — METFORMIN HCL 1000 MG PO TABS: 1000.0000 mg | ORAL_TABLET | Freq: Two times a day (BID) | ORAL | 3 refills | Status: DC

## 2018-03-30 ENCOUNTER — Other Ambulatory Visit: Payer: Self-pay | Admitting: Internal Medicine

## 2018-04-20 DIAGNOSIS — H3509 Other intraretinal microvascular abnormalities: Secondary | ICD-10-CM | POA: Diagnosis not present

## 2018-04-20 DIAGNOSIS — E113292 Type 2 diabetes mellitus with mild nonproliferative diabetic retinopathy without macular edema, left eye: Secondary | ICD-10-CM | POA: Diagnosis not present

## 2018-04-20 DIAGNOSIS — E113291 Type 2 diabetes mellitus with mild nonproliferative diabetic retinopathy without macular edema, right eye: Secondary | ICD-10-CM | POA: Diagnosis not present

## 2018-07-03 ENCOUNTER — Other Ambulatory Visit: Payer: Self-pay | Admitting: Internal Medicine

## 2018-07-19 NOTE — Progress Notes (Signed)
Subjective:    Patient ID: Patricia Mccullough, female    DOB: 09/06/1940, 78 y.o.   MRN: [id number]  HPI The patient is here for follow up.  She is not exercising regularly.     Mole on left breast:  She thinks it is changing.  It looks like it is getting taller.  She is unsure how long she has had it, but it has not been there forever.  Left breast:  Her left was sore for the last couple of days.  Today it is not sore.  She is almost due for her mammogram.   Hypertension: She is taking her medication daily. She is compliant with a low sodium diet.  She denies chest pain, palpitations, edema, shortness of breath and regular headaches. She does monitor her blood pressure at home - it was 111/?.    Diabetes: She is taking her medication daily as prescribed. She is compliant with a diabetic diet.  She monitors her sugars and they have been running around 145. She denies numbness/tingling in her feet, but does have tingling in her fingers intermittently.  She denies foot lesions. She is up-to-date with an ophthalmology examination.   H/o TIA:  She is taking ASA 325 mg daily.  She does not want to take a statin.     Medications and allergies reviewed with patient and updated if appropriate.  Patient Active Problem List   Diagnosis Date Noted  . Urinary frequency 01/19/2018  . Submandibular gland swelling 01/19/2018  . Leg cramps 01/14/2016  . Thyroid nodule 05/15/2013  . History of TIA (transient ischemic attack) 05/08/2013  . MILD NONPROLIFERATIVE DIABETIC RETINOPATHY 02/04/2010  . ALLERGIC URTICARIA 01/20/2009  . Essential hypertension 01/03/2009  . 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 amLODipine (NORVASC) 5 MG tablet Take 1 tablet (5 mg total) by mouth daily. Due for office visit for more refills. 30 tablet 0  . aspirin EC 325 MG tablet Take 1 tablet (325 mg  total) by mouth daily. 100 tablet 3  . GARLIC-CALCIUM PO Take 1 tablet by mouth daily.    . metFORMIN (GLUCOPHAGE) 1000 MG tablet Take 1 tablet (1,000 mg total) by mouth 2 (two) times daily with a meal. 180 tablet 3   No current facility-administered medications on file prior to visit.     Past Medical History:  Diagnosis Date  . Allergic urticaria   . Diabetes mellitus, type 2 ([place])   . Hyperlipidemia   . Hypertension   . 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 Thyroid nodule 05/15/2013   Right side, IR bx 6/28: benign follicular nodule    Past Surgical History:  Procedure Laterality Date  . CATARACT EXTRACTION  12/2008  . Vetrectomy  2009    Social History   Socioeconomic History  . Marital status: Married    Spouse name: Not on file  . Number of children: 3  . Years of education: Not on file  . Highest education level: Not on file  Occupational History  . Not on file  Social Needs  . Financial resource strain: Not hard at all  . Food insecurity:    Worry: Never true    Inability: Never true  . Transportation needs:    Medical: No    Non-medical: No  Tobacco Use  .  Smoking status: Never Smoker  . Smokeless tobacco: Never Used  Substance and Sexual Activity  . Alcohol use: No  . Drug use: No  . Sexual activity: Not Currently  Lifestyle  . Physical activity:    Days per week: 0 days    Minutes per session: 0 min  . Stress: Only a little  Relationships  . Social connections:    Talks on phone: More than three times a week    Gets together: More than three times a week    Attends religious service: More than 4 times per year    Active member of club or organization: Yes    Attends meetings of clubs or organizations: More than 4 times per year    Relationship status: Married  Other Topics Concern  . Not on file  Social History Narrative   Married, lives with spouse and their  grown son. 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 Breast cancer Mother   . Diabetes Other   . Hypertension Other   . Alcohol abuse Other     Review of Systems  Constitutional: Negative for chills and fever.  Respiratory: Negative for cough, shortness of breath and wheezing.   Cardiovascular: Negative for chest pain, palpitations and leg swelling.  Neurological: Positive for numbness (tingling in fingers intermittently). Negative for dizziness, weakness, light-headedness and headaches.       Objective:   Vitals:   07/20/18 0942  BP: (!) 154/80  Pulse: 76  Temp: 98.1 F (36.7 C)  SpO2: 97%   BP Readings from Last 3 Encounters:  07/20/18 (!) 154/80  01/19/18 134/72  01/19/18 134/72   Wt Readings from Last 3 Encounters:  07/20/18 154 lb (69.9 kg)  01/19/18 157 lb (71.2 kg)  01/19/18 157 lb (71.2 kg)   Body mass index is 27.28 kg/m.   Physical Exam    Constitutional: Appears well-developed and well-nourished. No distress.  HENT:  Head: Normocephalic and atraumatic.  Neck: Neck supple. No tracheal deviation present. No thyromegaly present.  No cervical lymphadenopathy Cardiovascular: Normal rate, regular rhythm and normal heart sounds.   No murmur heard. No carotid bruit .  No edema Pulmonary/Chest: Effort normal and breath sounds normal. No respiratory distress. No has no wheezes. No rales.  Skin: Skin is warm and dry. Not diaphoretic.  Round, dark mole on left breast near 12 o'clock Psychiatric: Normal mood and affect. Behavior is normal.      Assessment & Plan:    See Problem List for Assessment and Plan of chronic medical problems.

## 2018-07-19 NOTE — Patient Instructions (Addendum)
Call and schedule your mammogram -  [facility] Schedule an appointment by calling [phone]   Have your eye doctor send [place] a report regarding your eye exam.     Tests ordered today. Your results will be released to [facility] (or called to you) after review, usually within 72hours after test completion. If any changes need to be made, you will be notified at that same time.   Medications reviewed and updated.  Changes include :   None  A referral was ordered for dermatology.     Please followup in 6 months

## 2018-07-20 ENCOUNTER — Other Ambulatory Visit (INDEPENDENT_AMBULATORY_CARE_PROVIDER_SITE_OTHER): Payer: Medicare Other

## 2018-07-20 ENCOUNTER — Other Ambulatory Visit: Payer: Self-pay

## 2018-07-20 ENCOUNTER — Ambulatory Visit (INDEPENDENT_AMBULATORY_CARE_PROVIDER_SITE_OTHER): Payer: Medicare Other | Admitting: Internal Medicine

## 2018-07-20 ENCOUNTER — Encounter: Payer: Self-pay | Admitting: Internal Medicine

## 2018-07-20 VITALS — BP 154/80 | HR 76 | Temp 98.1°F | Ht 63.0 in | Wt 154.0 lb

## 2018-07-20 DIAGNOSIS — E113599 Type 2 diabetes mellitus with proliferative diabetic retinopathy without macular edema, unspecified eye: Secondary | ICD-10-CM

## 2018-07-20 DIAGNOSIS — I10 Essential (primary) hypertension: Secondary | ICD-10-CM | POA: Diagnosis not present

## 2018-07-20 DIAGNOSIS — Z8673 Personal history of transient ischemic attack (TIA), and cerebral infarction without residual deficits: Secondary | ICD-10-CM

## 2018-07-20 DIAGNOSIS — D229 Melanocytic nevi, unspecified: Secondary | ICD-10-CM | POA: Diagnosis not present

## 2018-07-20 LAB — COMPREHENSIVE METABOLIC PANEL
ALT: 10 U/L (ref 0–35)
AST: 12 U/L (ref 0–37)
Albumin: 4.1 g/dL (ref 3.5–5.2)
Alkaline Phosphatase: 52 U/L (ref 39–117)
BUN: 21 mg/dL (ref 6–23)
CO2: 28 mEq/L (ref 19–32)
Calcium: 9.8 mg/dL (ref 8.4–10.5)
Chloride: 102 mEq/L (ref 96–112)
Creatinine, Ser: 0.99 mg/dL (ref 0.40–1.20)
GFR: 65.84 mL/min (ref >60.00)
Glucose, Bld: 133 mg/dL — ABNORMAL HIGH (ref 70–99)
Potassium: 4.1 mEq/L (ref 3.5–5.1)
Sodium: 138 mEq/L (ref 135–145)
Total Bilirubin: 0.4 mg/dL (ref 0.2–1.2)
Total Protein: 7.7 g/dL (ref 6.0–8.3)

## 2018-07-20 LAB — LIPID PANEL
Cholesterol: 188 mg/dL (ref 0–200)
HDL: 63.5 mg/dL (ref >39.00)
LDL Cholesterol: 92 mg/dL (ref 0–99)
NonHDL: 124.1
Total CHOL/HDL Ratio: 3
Triglycerides: 163 mg/dL — ABNORMAL HIGH (ref 0.0–149.0)
VLDL: 32.6 mg/dL (ref 0.0–40.0)

## 2018-07-20 LAB — CBC WITH DIFFERENTIAL/PLATELET
Basophils Absolute: 0.1 10*3/uL (ref 0.0–0.1)
Basophils Relative: 1 % (ref 0.0–3.0)
Eosinophils Absolute: 0.4 10*3/uL (ref 0.0–0.7)
Eosinophils Relative: 5.1 % — ABNORMAL HIGH (ref 0.0–5.0)
HCT: 37.1 % (ref 36.0–46.0)
Hemoglobin: 12.5 g/dL (ref 12.0–15.0)
Lymphocytes Relative: 40.8 % (ref 12.0–46.0)
Lymphs Abs: 2.9 10*3/uL (ref 0.7–4.0)
MCHC: 33.6 g/dL (ref 30.0–36.0)
MCV: 94.7 fl (ref 78.0–100.0)
Monocytes Absolute: 0.6 10*3/uL (ref 0.1–1.0)
Monocytes Relative: 9.1 % (ref 3.0–12.0)
Neutro Abs: 3.1 10*3/uL (ref 1.4–7.7)
Neutrophils Relative %: 44 % (ref 43.0–77.0)
Platelets: 203 10*3/uL (ref 150.0–400.0)
RBC: 3.92 Mil/uL (ref 3.87–5.11)
RDW: 15.3 % (ref 11.5–15.5)
WBC: 7.1 10*3/uL (ref 4.0–10.5)

## 2018-07-20 LAB — HEMOGLOBIN A1C: Hgb A1c MFr Bld: 7.6 % — ABNORMAL HIGH (ref 4.6–6.5)

## 2018-07-20 MED ORDER — FREESTYLE LIBRE SENSOR SYSTEM MISC: 0 refills | Status: DC

## 2018-07-20 MED ORDER — FREESTYLE LIBRE 14 DAY SENSOR MISC: 1.0000 | Freq: Every day | 5 refills | Status: DC

## 2018-07-20 NOTE — Assessment & Plan Note (Signed)
Mole appears benign, but given that it has changed and has not been there forever advised her to see dermatology Referral ordered

## 2018-07-20 NOTE — Assessment & Plan Note (Signed)
History of TIA No concerning symptoms Continue aspirin 325 mg She prefers not to take a statin Check CMP, CBC, lipid panel

## 2018-07-20 NOTE — Assessment & Plan Note (Addendum)
Sugars only better controlled at home Check A1c Encourage regular exercise Low sugar/carbohydrate diet We will see if the freestyle Elenor Legato is covered by her insurance Follow-up in 6 months

## 2018-07-20 NOTE — Assessment & Plan Note (Signed)
BP Readings from Last 3 Encounters:  07/20/18 (!) 154/80  01/19/18 134/72  01/19/18 134/72    Probably controlled We will continue current medication at current dose Encouraged her to monitor at home CMP

## 2018-08-15 DIAGNOSIS — D229 Melanocytic nevi, unspecified: Secondary | ICD-10-CM | POA: Diagnosis not present

## 2018-08-17 DIAGNOSIS — H3509 Other intraretinal microvascular abnormalities: Secondary | ICD-10-CM | POA: Diagnosis not present

## 2018-08-28 ENCOUNTER — Other Ambulatory Visit: Payer: Self-pay | Admitting: Internal Medicine

## 2018-08-28 DIAGNOSIS — Z1231 Encounter for screening mammogram for malignant neoplasm of breast: Secondary | ICD-10-CM

## 2018-10-12 ENCOUNTER — Ambulatory Visit
Admission: RE | Admit: 2018-10-12 | Discharge: 2018-10-12 | Disposition: A | Discharge status: Home / Self Care | Payer: Medicare Other | Source: Ambulatory Visit | Attending: Internal Medicine | Admitting: Internal Medicine

## 2018-10-12 ENCOUNTER — Other Ambulatory Visit: Payer: Self-pay

## 2018-10-12 DIAGNOSIS — Z1231 Encounter for screening mammogram for malignant neoplasm of breast: Secondary | ICD-10-CM

## 2018-11-20 ENCOUNTER — Other Ambulatory Visit: Payer: Self-pay | Admitting: Internal Medicine

## 2018-12-12 DIAGNOSIS — D239 Other benign neoplasm of skin, unspecified: Secondary | ICD-10-CM | POA: Diagnosis not present

## 2019-01-23 ENCOUNTER — Ambulatory Visit: Payer: Medicare Other | Admitting: Internal Medicine
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
## 2019-02-23 ENCOUNTER — Encounter: Payer: Self-pay | Admitting: Internal Medicine

## 2019-02-23 ENCOUNTER — Ambulatory Visit (INDEPENDENT_AMBULATORY_CARE_PROVIDER_SITE_OTHER): Payer: Medicare Other | Admitting: Internal Medicine

## 2019-02-23 ENCOUNTER — Other Ambulatory Visit: Payer: Self-pay

## 2019-02-23 VITALS — BP 164/78 | HR 77 | Temp 98.0°F | Resp 16 | Ht 63.0 in | Wt 150.0 lb

## 2019-02-23 DIAGNOSIS — I10 Essential (primary) hypertension: Secondary | ICD-10-CM

## 2019-02-23 DIAGNOSIS — E113599 Type 2 diabetes mellitus with proliferative diabetic retinopathy without macular edema, unspecified eye: Secondary | ICD-10-CM

## 2019-02-23 DIAGNOSIS — Z8673 Personal history of transient ischemic attack (TIA), and cerebral infarction without residual deficits: Secondary | ICD-10-CM | POA: Diagnosis not present

## 2019-02-23 LAB — COMPREHENSIVE METABOLIC PANEL
ALT: 10 U/L (ref 0–35)
AST: 14 U/L (ref 0–37)
Albumin: 4.2 g/dL (ref 3.5–5.2)
Alkaline Phosphatase: 61 U/L (ref 39–117)
BUN: 19 mg/dL (ref 6–23)
CO2: 28 mEq/L (ref 19–32)
Calcium: 10 mg/dL (ref 8.4–10.5)
Chloride: 103 mEq/L (ref 96–112)
Creatinine, Ser: 0.89 mg/dL (ref 0.40–1.20)
GFR: 74.33 mL/min (ref >60.00)
Glucose, Bld: 102 mg/dL — ABNORMAL HIGH (ref 70–99)
Potassium: 4 mEq/L (ref 3.5–5.1)
Sodium: 139 mEq/L (ref 135–145)
Total Bilirubin: 0.3 mg/dL (ref 0.2–1.2)
Total Protein: 7.3 g/dL (ref 6.0–8.3)

## 2019-02-23 LAB — CBC WITH DIFFERENTIAL/PLATELET
Basophils Absolute: 0 10*3/uL (ref 0.0–0.1)
Basophils Relative: 0.6 % (ref 0.0–3.0)
Eosinophils Absolute: 0.5 10*3/uL (ref 0.0–0.7)
Eosinophils Relative: 5.8 % — ABNORMAL HIGH (ref 0.0–5.0)
HCT: 37.1 % (ref 36.0–46.0)
Hemoglobin: 12.2 g/dL (ref 12.0–15.0)
Lymphocytes Relative: 40.1 % (ref 12.0–46.0)
Lymphs Abs: 3.2 10*3/uL (ref 0.7–4.0)
MCHC: 32.8 g/dL (ref 30.0–36.0)
MCV: 95.4 fl (ref 78.0–100.0)
Monocytes Absolute: 0.6 10*3/uL (ref 0.1–1.0)
Monocytes Relative: 7.6 % (ref 3.0–12.0)
Neutro Abs: 3.7 10*3/uL (ref 1.4–7.7)
Neutrophils Relative %: 45.9 % (ref 43.0–77.0)
Platelets: 229 10*3/uL (ref 150.0–400.0)
RBC: 3.89 Mil/uL (ref 3.87–5.11)
RDW: 14.9 % (ref 11.5–15.5)
WBC: 8 10*3/uL (ref 4.0–10.5)

## 2019-02-23 LAB — TSH: TSH: 0.99 u[IU]/mL (ref 0.35–4.50)

## 2019-02-23 LAB — LIPID PANEL
Cholesterol: 211 mg/dL — ABNORMAL HIGH (ref 0–200)
HDL: 60.7 mg/dL (ref >39.00)
NonHDL: 150.39
Total CHOL/HDL Ratio: 3
Triglycerides: 307 mg/dL — ABNORMAL HIGH (ref 0.0–149.0)
VLDL: 61.4 mg/dL — ABNORMAL HIGH (ref 0.0–40.0)

## 2019-02-23 LAB — HEMOGLOBIN A1C: Hgb A1c MFr Bld: 7.2 % — ABNORMAL HIGH (ref 4.6–6.5)

## 2019-02-23 LAB — LDL CHOLESTEROL, DIRECT: Direct LDL: 44 mg/dL

## 2019-02-23 MED ORDER — AMLODIPINE BESYLATE 5 MG PO TABS: 5.0000 mg | ORAL_TABLET | Freq: Every day | ORAL | 1 refills | Status: DC

## 2019-02-23 NOTE — Assessment & Plan Note (Signed)
Chronic Blood pressure elevated here today-?  Controlled She does check her blood pressure at home regularly and she feels it is well controlled-systolic BP always less than 130 In the past she was on losartan in addition to amlodipine She is competent her blood pressure is well controlled and feels that she is stressful things that have happened over the past week or likely will cause the BP to go up She will monitor her blood pressure at home over the weekend and let me know if it is more than [id number] systolically Advised bringing her blood pressure cuff then make sure it is accurate Discussed the importance of good blood pressure control given her history Continue amlodipine 5 mg daily CMP, TSH, CBC

## 2019-02-23 NOTE — Assessment & Plan Note (Signed)
Chronic Check A1c She has been compliant with a diabetic diet and she is exercising She has lost some weight We will revise medication if necessary

## 2019-02-23 NOTE — Patient Instructions (Addendum)
  Blood work was ordered.     Medications reviewed and updated.  Changes include :   none  Your prescription(s) have been submitted to your pharmacy. Please take as directed and contact our office if you believe you are having problem(s) with the medication(s).  Monitor your BP at home - it should be less than 130/80.  If it is not we need to start you back on the second medication, losartan.      Please followup in 6 months

## 2019-02-23 NOTE — Assessment & Plan Note (Signed)
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

## 2019-02-25 ENCOUNTER — Encounter: Payer: Self-pay | Admitting: Internal Medicine

## 2019-03-08 DIAGNOSIS — E113291 Type 2 diabetes mellitus with mild nonproliferative diabetic retinopathy without macular edema, right eye: Secondary | ICD-10-CM | POA: Diagnosis not present

## 2019-03-08 DIAGNOSIS — E119 Type 2 diabetes mellitus without complications: Secondary | ICD-10-CM | POA: Diagnosis not present

## 2019-03-31 ENCOUNTER — Other Ambulatory Visit: Payer: Self-pay | Admitting: Internal Medicine
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
## 2019-08-22 NOTE — Patient Instructions (Signed)
  Blood work was ordered.     Medications reviewed and updated.  Changes include :     Your prescription(s) have been submitted to your pharmacy. Please take as directed and contact our office if you believe you are having problem(s) with the medication(s).  A referral was ordered for        Someone from their office will call you to schedule an appointment.    Please followup in 6 months   

## 2019-08-23 ENCOUNTER — Encounter: Payer: Medicare Other | Admitting: Internal Medicine

## 2019-08-23 ENCOUNTER — Emergency Department (HOSPITAL_COMMUNITY)
Admission: EM | Admit: 2019-08-23 | Discharge: 2019-08-23 | Disposition: A | Discharge status: Home / Self Care | Payer: Medicare Other | Attending: Emergency Medicine | Admitting: Emergency Medicine

## 2019-08-23 ENCOUNTER — Encounter (HOSPITAL_COMMUNITY): Payer: Self-pay | Admitting: Emergency Medicine

## 2019-08-23 ENCOUNTER — Emergency Department (HOSPITAL_COMMUNITY): Payer: Medicare Other

## 2019-08-23 ENCOUNTER — Other Ambulatory Visit: Payer: Self-pay

## 2019-08-23 DIAGNOSIS — S3993XA Unspecified injury of pelvis, initial encounter: Secondary | ICD-10-CM | POA: Diagnosis not present

## 2019-08-23 DIAGNOSIS — Y92410 Unspecified street and highway as the place of occurrence of the external cause: Secondary | ICD-10-CM | POA: Insufficient documentation

## 2019-08-23 DIAGNOSIS — R6889 Other general symptoms and signs: Secondary | ICD-10-CM | POA: Diagnosis not present

## 2019-08-23 DIAGNOSIS — E113299 Type 2 diabetes mellitus with mild nonproliferative diabetic retinopathy without macular edema, unspecified eye: Secondary | ICD-10-CM | POA: Insufficient documentation

## 2019-08-23 DIAGNOSIS — Z8673 Personal history of transient ischemic attack (TIA), and cerebral infarction without residual deficits: Secondary | ICD-10-CM | POA: Diagnosis not present

## 2019-08-23 DIAGNOSIS — Z7982 Long term (current) use of aspirin: Secondary | ICD-10-CM | POA: Diagnosis not present

## 2019-08-23 DIAGNOSIS — M533 Sacrococcygeal disorders, not elsewhere classified: Secondary | ICD-10-CM | POA: Diagnosis not present

## 2019-08-23 DIAGNOSIS — E119 Type 2 diabetes mellitus without complications: Secondary | ICD-10-CM | POA: Diagnosis not present

## 2019-08-23 DIAGNOSIS — Z7984 Long term (current) use of oral hypoglycemic drugs: Secondary | ICD-10-CM | POA: Insufficient documentation

## 2019-08-23 DIAGNOSIS — M79652 Pain in left thigh: Secondary | ICD-10-CM | POA: Insufficient documentation

## 2019-08-23 DIAGNOSIS — I10 Essential (primary) hypertension: Secondary | ICD-10-CM | POA: Insufficient documentation

## 2019-08-23 DIAGNOSIS — M160 Bilateral primary osteoarthritis of hip: Secondary | ICD-10-CM | POA: Diagnosis not present

## 2019-08-23 DIAGNOSIS — Z743 Need for continuous supervision: Secondary | ICD-10-CM | POA: Diagnosis not present

## 2019-08-23 DIAGNOSIS — M25552 Pain in left hip: Secondary | ICD-10-CM | POA: Diagnosis not present

## 2019-08-23 DIAGNOSIS — M1612 Unilateral primary osteoarthritis, left hip: Secondary | ICD-10-CM | POA: Diagnosis not present

## 2019-08-23 MED ORDER — IBUPROFEN 400 MG PO TABS: 600.0000 mg | ORAL_TABLET | Freq: Once | ORAL | Status: DC
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
## 2019-08-23 NOTE — ED Notes (Signed)
All discharge instructions reviewed with patient and family. No questions verbalized at this time. Pt discharged without issue.
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
## 2019-08-27 NOTE — Patient Instructions (Addendum)
  Blood work was ordered.     Medications reviewed and updated.  Changes include :   none    A referral was ordered for a gynecologist.    Please followup in 6 months
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
## 2019-08-28 ENCOUNTER — Other Ambulatory Visit (INDEPENDENT_AMBULATORY_CARE_PROVIDER_SITE_OTHER): Payer: Medicare Other

## 2019-08-28 ENCOUNTER — Encounter: Payer: Self-pay | Admitting: Internal Medicine

## 2019-08-28 ENCOUNTER — Ambulatory Visit (INDEPENDENT_AMBULATORY_CARE_PROVIDER_SITE_OTHER): Payer: Medicare Other | Admitting: Internal Medicine

## 2019-08-28 ENCOUNTER — Other Ambulatory Visit: Payer: Self-pay

## 2019-08-28 VITALS — BP 126/68 | HR 76 | Temp 98.1°F | Ht 62.0 in | Wt 149.0 lb

## 2019-08-28 DIAGNOSIS — N814 Uterovaginal prolapse, unspecified: Secondary | ICD-10-CM | POA: Diagnosis not present

## 2019-08-28 DIAGNOSIS — I10 Essential (primary) hypertension: Secondary | ICD-10-CM

## 2019-08-28 DIAGNOSIS — E113599 Type 2 diabetes mellitus with proliferative diabetic retinopathy without macular edema, unspecified eye: Secondary | ICD-10-CM

## 2019-08-28 DIAGNOSIS — Z8673 Personal history of transient ischemic attack (TIA), and cerebral infarction without residual deficits: Secondary | ICD-10-CM | POA: Diagnosis not present

## 2019-08-28 LAB — COMPREHENSIVE METABOLIC PANEL
ALT: 9 U/L (ref 0–35)
AST: 11 U/L (ref 0–37)
Albumin: 4.1 g/dL (ref 3.5–5.2)
Alkaline Phosphatase: 66 U/L (ref 39–117)
BUN: 22 mg/dL (ref 6–23)
CO2: 28 mEq/L (ref 19–32)
Calcium: 9.9 mg/dL (ref 8.4–10.5)
Chloride: 102 mEq/L (ref 96–112)
Creatinine, Ser: 0.99 mg/dL (ref 0.40–1.20)
GFR: 65.65 mL/min (ref >60.00)
Glucose, Bld: 127 mg/dL — ABNORMAL HIGH (ref 70–99)
Potassium: 4.3 mEq/L (ref 3.5–5.1)
Sodium: 137 mEq/L (ref 135–145)
Total Bilirubin: 0.4 mg/dL (ref 0.2–1.2)
Total Protein: 7.5 g/dL (ref 6.0–8.3)

## 2019-08-28 LAB — MICROALBUMIN / CREATININE URINE RATIO
Creatinine,U: 182.4 mg/dL
Microalb Creat Ratio: 2.3 mg/g (ref 0.0–30.0)
Microalb, Ur: 4.3 mg/dL — ABNORMAL HIGH (ref 0.0–1.9)

## 2019-08-28 LAB — HEMOGLOBIN A1C: Hgb A1c MFr Bld: 7.7 % — ABNORMAL HIGH (ref 4.6–6.5)

## 2019-08-28 MED ORDER — FREESTYLE LIBRE 14 DAY SENSOR MISC: 1.0000 | Freq: Every day | 5 refills | Status: DC

## 2019-08-28 NOTE — Addendum Note (Signed)
Addended by: Trenda Moots on: 07/22/3708 01:02 PM   Modules accepted: Orders

## 2019-08-28 NOTE — Addendum Note (Signed)
Addended by: Binnie Rail on: 08/28/2019 12:46 PM   Modules accepted: Orders

## 2019-08-28 NOTE — Assessment & Plan Note (Signed)
Acute She states a mass coming out of her vaginal area and believes she may have uterine prolapse Will refer to GYN for further evaluation and treatment Advised stool softener if she has difficulty having a bowel movement

## 2019-08-28 NOTE — Assessment & Plan Note (Signed)
Chronic BP well controlled Current regimen effective and well tolerated Continue current medications at current doses cmp  

## 2019-08-28 NOTE — Assessment & Plan Note (Addendum)
Chronic Check a1c, urine micro Continue metformin Low sugar / carb diet Stressed regular exercise

## 2019-08-28 NOTE — Assessment & Plan Note (Signed)
History of TIA-chronic Blood pressure well controlled Continue aspirin LDL has been at goal without medication

## 2019-09-01 ENCOUNTER — Ambulatory Visit: Payer: Medicare Other | Attending: Internal Medicine

## 2019-09-01 DIAGNOSIS — Z23 Encounter for immunization: Secondary | ICD-10-CM

## 2019-09-01 NOTE — Progress Notes (Signed)
° °  Covid-19 Vaccination Clinic  Name:  Patricia Mccullough    MRN: [id number] DOB: December 28, 1940  09/01/2019  [name] was observed post Covid-19 immunization for 15 minutes without incident. She was provided with Vaccine Information Sheet and instruction to access the V-Safe system.   [name] was instructed to call 911 with any severe reactions post vaccine:  Difficulty breathing   Swelling of face and throat   A fast heartbeat   A bad rash all over body   Dizziness and weakness   Immunizations Administered    Name Date Dose VIS Date Route   Pfizer COVID-19 Vaccine 09/01/2019  9:30 AM 0.3 mL 04/11/2018 Intramuscular   Manufacturer: [place]   Lot: [id number]   [place]: [id number]

## 2019-09-06 DIAGNOSIS — E119 Type 2 diabetes mellitus without complications: Secondary | ICD-10-CM | POA: Diagnosis not present

## 2019-09-06 DIAGNOSIS — E113291 Type 2 diabetes mellitus with mild nonproliferative diabetic retinopathy without macular edema, right eye: Secondary | ICD-10-CM | POA: Diagnosis not present

## 2019-09-19 ENCOUNTER — Ambulatory Visit: Payer: Self-pay | Admitting: Obstetrics and Gynecology

## 2019-09-19 DIAGNOSIS — Z0289 Encounter for other administrative examinations: Secondary | ICD-10-CM

## 2019-09-24 ENCOUNTER — Other Ambulatory Visit: Payer: Self-pay | Admitting: Internal Medicine

## 2019-09-25 ENCOUNTER — Ambulatory Visit: Payer: Medicare Other | Attending: Internal Medicine

## 2019-09-25 DIAGNOSIS — Z23 Encounter for immunization: Secondary | ICD-10-CM

## 2019-09-25 NOTE — Progress Notes (Signed)
   Covid-19 Vaccination Clinic  Name:  Patricia Mccullough    MRN: [id number] DOB: 1940-04-01  09/25/2019  [name] was observed post Covid-19 immunization for 15 minutes without incident. She was provided with Vaccine Information Sheet and instruction to access the V-Safe system.   [name] was instructed to call 911 with any severe reactions post vaccine: Marland Kitchen Difficulty breathing  . Swelling of face and throat  . A fast heartbeat  . A bad rash all over body  . Dizziness and weakness   Immunizations Administered    Name Date Dose VIS Date Route   Pfizer COVID-19 Vaccine 09/25/2019  8:45 AM 0.3 mL 04/11/2018 Intramuscular   Manufacturer: [employer]   Lot: [id number]   [place]: [id number]

## 2019-11-05 ENCOUNTER — Other Ambulatory Visit: Payer: Self-pay | Admitting: Internal Medicine

## 2019-11-29 ENCOUNTER — Ambulatory Visit (INDEPENDENT_AMBULATORY_CARE_PROVIDER_SITE_OTHER): Payer: Medicare Other | Admitting: Obstetrics and Gynecology

## 2019-11-29 ENCOUNTER — Encounter: Payer: Self-pay | Admitting: Obstetrics and Gynecology

## 2019-11-29 ENCOUNTER — Other Ambulatory Visit: Payer: Self-pay

## 2019-11-29 VITALS — BP 116/70 | Ht 62.0 in | Wt 151.0 lb

## 2019-11-29 DIAGNOSIS — N816 Rectocele: Secondary | ICD-10-CM

## 2019-11-29 DIAGNOSIS — N814 Uterovaginal prolapse, unspecified: Secondary | ICD-10-CM

## 2019-11-29 DIAGNOSIS — N8111 Cystocele, midline: Secondary | ICD-10-CM

## 2019-11-29 NOTE — Progress Notes (Signed)
Patricia Mccullough 04/19/1940 [id number]  SUBJECTIVE:  79 y.o. G2P2002 female referred to [place] for evaluation and management of possible pelvic organ prolapse. The patient has noticed in the past few months that she has had some vaginal bulging which seems to get worse with continued activity. Bladder works fine, she indicates no problems with urgency and she feels that she completely empties her bladder. She is not having any vaginal bleeding. She typically has had regular bowel movements but more recently has noted a little bit of an issue with incomplete stool evacuation and constipation. She is not having any pelvic pain. She does have some irritation with finding that she needs to sit a certain way to prevent the prolapse from coming out.   Current Outpatient Medications  Medication Sig Dispense Refill  . amLODipine (NORVASC) 5 MG tablet Take 1 tablet by mouth once daily 90 tablet 1  . aspirin EC 325 MG tablet Take 1 tablet (325 mg total) by mouth daily. 100 tablet 3  . Continuous Blood Gluc Sensor (FREESTYLE LIBRE 14 DAY SENSOR) MISC 1 each by Does not apply route daily. E11.9 2 each 5  . Continuous Blood Gluc Sensor ([place]) MISC Use as directed to check sugars.  Dx  E11.9 with hyperglycemia 1 each 0  . GARLIC-CALCIUM PO Take 1 tablet by mouth daily.    . metFORMIN (GLUCOPHAGE) 1000 MG tablet TAKE 1 TABLET BY MOUTH TWICE DAILY WITH MEALS 180 tablet 0  . Propylene Glycol (SYSTANE BALANCE OP) Place 1 drop into both eyes daily.     No current facility-administered medications for this visit.   Allergies: Patient has no known allergies.  No LMP recorded. Patient is postmenopausal.  Past medical history,surgical history, problem list, medications, allergies, family history and social history were all reviewed and documented as reviewed in the EPIC chart.  ROS: Pertinent positives and negatives as reviewed in HPI  OBJECTIVE:  BP 116/70   Ht 5\' 2"  (1.575 m)   Wt  151 lb (68.5 kg)   BMI 27.62 kg/m  The patient appears well, alert, oriented, in no distress. PELVIC EXAM: VULVA: normal appearing vulva with atrophic change, no masses, tenderness or lesions, VAGINA: normal appearing vagina with atrophic change, normal color and discharge, no lesions, with Valsalva and split speculum exam there is grade 2-3 uterine prolapse, grade 2 cystocele, grade 1-2 rectocele. CERVIX: normal appearing cervix without discharge or lesions, UTERUS: uterus is normal size, shape, consistency and nontender, ADNEXA: normal adnexa in size, nontender and no masses. RECTAL: Rectocele confirmed, normal sphincter tone, weakened rectovaginal wall, stool in distal vaginal vault Chaperone: Caryn Bee present during the examination  ASSESSMENT:  79 y.o. [id number] here with pelvic organ prolapse  PLAN:  I discussed the etiology and risk factors for pelvic prolapse.  The uterus/cervix are the most prominent component of her prolapse she also has a significant cystocele and more mild rectocele.  I used anatomical diagrams to discuss the anatomy of her type of prolapse.  In general, management of prolapse can be expectant, a trial of pelvic floor PT and/or pessary may be employed, or surgical correction can be mood.  I reassured her that the prolapse issue that she is having in and of itself will not harm her health long-term.  Therefore I reiterated that she does not need to necessarily have to do anything to manage the prolapse.  However, given time and daily activities prolapse can become worse and perhaps become more symptomatic in terms  of disrupting function of bowel or bladder and/or causing pelvic discomfort symptoms.  I discussed pessary management and how it would entail maintenance of the pessary with regular cleaning and removal. I offered to perform a pessary fitting today but she would like to first try pelvic floor PT. I will have staff help her with a referral. I did let her know that I  do not think that PT alone will provide her with sufficient relief of her symptoms as she does have more moderate to severe prolapse overall. If she does not find relief with the PT then I recommended that she return for a pessary fitting, and she is comfortable with this plan.    Joseph Pierini MD 11/29/19

## 2019-11-29 NOTE — Patient Instructions (Addendum)
Pelvic Organ Prolapse Pelvic organ prolapse is the stretching, bulging, or dropping of pelvic organs into an abnormal position. It happens when the muscles and tissues that surround and support pelvic structures become weak or stretched. Pelvic organ prolapse can involve the:  Vagina (vaginal prolapse).  Uterus (uterine prolapse).  Bladder (cystocele).  Rectum (rectocele).  Intestines (enterocele). When organs other than the vagina are involved, they often bulge into the vagina or protrude from the vagina, depending on how severe the prolapse is. What are the causes? This condition may be caused by:  Pregnancy, labor, and childbirth.  Past pelvic surgery.  Decreased production of the hormone estrogen associated with menopause.  Consistently lifting more than 50 lb (23 kg).  Obesity.  Long-term inability to pass stool (chronic constipation).  A cough that lasts a long time (chronic).  Buildup of fluid in the abdomen due to certain diseases and other conditions. What are the signs or symptoms? Symptoms of this condition include:  Passing a little urine (loss of bladder control) when you cough, sneeze, strain, and exercise (stress incontinence). This may be worse immediately after childbirth. It may gradually improve over time.  Feeling pressure in your pelvis or vagina. This pressure may increase when you cough or when you are passing stool.  A bulge that protrudes from the opening of your vagina.  Difficulty passing urine or stool.  Pain in your lower back.  Pain, discomfort, or disinterest in sex.  Repeated bladder infections (urinary tract infections).  Difficulty inserting a tampon. In some people, this condition causes no symptoms. How is this diagnosed? This condition may be diagnosed based on a vaginal and rectal exam. During the exam, you may be asked to cough and strain while you are lying down, sitting, and standing up. Your health care provider will  determine if other tests are required, such as bladder function tests. How is this treated? Treatment for this condition may depend on your symptoms. Treatment may include:  Lifestyle changes, such as changes to your diet.  Emptying your bladder at scheduled times (bladder training therapy). This can help reduce or avoid urinary incontinence.  Estrogen. Estrogen may help mild prolapse by increasing the strength and tone of pelvic floor muscles.  Kegel exercises. These may help mild cases of prolapse by strengthening and tightening the muscles of the pelvic floor.  A soft, flexible device that helps support the vaginal walls and keep pelvic organs in place (pessary). This is inserted into your vagina by your health care provider.  Surgery. This is often the only form of treatment for severe prolapse. Follow these instructions at home:  Avoid drinking beverages that contain caffeine or alcohol.  Increase your intake of high-fiber foods. This can help decrease constipation and straining during bowel movements.  Lose weight if recommended by your health care provider.  Wear a sanitary pad or adult diapers if you have urinary incontinence.  Avoid heavy lifting and straining with exercise and work. Do not hold your breath when you perform mild to moderate lifting and exercise activities. Limit your activities as directed by your health care provider.  Do Kegel exercises as directed by your health care provider. To do this: ? Squeeze your pelvic floor muscles tight. You should feel a tight lift in your rectal area and a tightness in your vaginal area. Keep your stomach, buttocks, and legs relaxed. ? Hold the muscles tight for up to 10 seconds. ? Relax your muscles. ? Repeat this exercise 50 times a day,   or as many times as told by your health care provider. Continue to do this exercise for at least 4-6 weeks, or for as long as told by your health care provider.  Take over-the-counter and  prescription medicines only as told by your health care provider.  If you have a pessary, take care of it as told by your health care provider.  Keep all follow-up visits as told by your health care provider. This is important. Contact a health care provider if you:  Have symptoms that interfere with your daily activities or sex life.  Need medicine to help with the discomfort.  Notice bleeding from your vagina that is not related to your period.  Have a fever.  Have pain or bleeding when you urinate.  Have bleeding when you pass stool.  Pass urine when you have sex.  Have chronic constipation.  Have a pessary that falls out.  Have bad smelling vaginal discharge.  Have an unusual, low pain in your abdomen. Summary  Pelvic organ prolapse is the stretching, bulging, or dropping of pelvic organs into an abnormal position. It happens when the muscles and tissues that surround and support pelvic structures become weak or stretched.  When organs other than the vagina are involved, they often bulge into the vagina or protrude from the vagina, depending on how severe the prolapse is.  In most cases, this condition needs to be treated only if it produces symptoms. Treatment may include lifestyle changes, estrogen, Kegel exercises, pessary insertion, or surgery.  Avoid heavy lifting and straining with exercise and work. Do not hold your breath when you perform mild to moderate lifting and exercise activities. Limit your activities as directed by your health care provider. This information is not intended to replace advice given to you by your health care provider. Make sure you discuss any questions you have with your health care provider. Document Revised: 02/23/2017 Document Reviewed: 02/23/2017 Elsevier Patient Education  2020 [employer]  

## 2019-11-30 ENCOUNTER — Telehealth: Payer: Self-pay | Admitting: *Deleted

## 2019-11-30 DIAGNOSIS — S63125A Dislocation of unspecified interphalangeal joint of left thumb, initial encounter: Secondary | ICD-10-CM | POA: Diagnosis not present

## 2019-11-30 DIAGNOSIS — N8111 Cystocele, midline: Secondary | ICD-10-CM

## 2019-11-30 DIAGNOSIS — N814 Uterovaginal prolapse, unspecified: Secondary | ICD-10-CM

## 2019-11-30 DIAGNOSIS — Z23 Encounter for immunization: Secondary | ICD-10-CM | POA: Diagnosis not present

## 2019-11-30 DIAGNOSIS — S61012A Laceration without foreign body of left thumb without damage to nail, initial encounter: Secondary | ICD-10-CM | POA: Diagnosis not present

## 2019-11-30 NOTE — Telephone Encounter (Signed)
Referral placed at [facility] they will call to schedule.

## 2019-11-30 NOTE — Telephone Encounter (Signed)
-----   Message from Joseph Pierini, MD sent at 11/29/2019 12:58 PM EDT ----- Regarding: pelvic floor PT Patient with moderate to severe pelvic organ prolapse, she would like to try pelvic floor physical therapy see if this helps her at all, please help with referral

## 2019-12-03 DIAGNOSIS — S61012A Laceration without foreign body of left thumb without damage to nail, initial encounter: Secondary | ICD-10-CM | POA: Diagnosis not present

## 2019-12-07 NOTE — Telephone Encounter (Signed)
[place] PT office has called and left message for patient to call. Since referral has been received , this encounter will be closed.

## 2019-12-10 DIAGNOSIS — M79645 Pain in left finger(s): Secondary | ICD-10-CM | POA: Diagnosis not present

## 2019-12-10 DIAGNOSIS — S61219D Laceration without foreign body of unspecified finger without damage to nail, subsequent encounter: Secondary | ICD-10-CM | POA: Diagnosis not present

## 2019-12-17 ENCOUNTER — Other Ambulatory Visit: Payer: Self-pay | Admitting: Internal Medicine

## 2019-12-17 DIAGNOSIS — Z1231 Encounter for screening mammogram for malignant neoplasm of breast: Secondary | ICD-10-CM

## 2020-01-24 ENCOUNTER — Other Ambulatory Visit: Payer: Self-pay

## 2020-01-24 ENCOUNTER — Ambulatory Visit
Admission: RE | Admit: 2020-01-24 | Discharge: 2020-01-24 | Disposition: A | Discharge status: Home / Self Care | Payer: Medicare Other | Source: Ambulatory Visit | Attending: Internal Medicine | Admitting: Internal Medicine

## 2020-01-24 DIAGNOSIS — Z1231 Encounter for screening mammogram for malignant neoplasm of breast: Secondary | ICD-10-CM | POA: Diagnosis not present

## 2020-01-29 ENCOUNTER — Other Ambulatory Visit: Payer: Self-pay | Admitting: Internal Medicine

## 2020-01-29 DIAGNOSIS — R928 Other abnormal and inconclusive findings on diagnostic imaging of breast: Secondary | ICD-10-CM

## 2020-02-13 ENCOUNTER — Other Ambulatory Visit: Payer: Self-pay | Admitting: Internal Medicine

## 2020-02-19 ENCOUNTER — Other Ambulatory Visit: Payer: Self-pay | Admitting: Internal Medicine

## 2020-03-02 NOTE — Progress Notes (Signed)
Subjective:    Patient ID: Patricia Mccullough, female    DOB: Mar 11, 1940, 80 y.o.   MRN: [id number]   This visit occurred during the SARS-CoV-2 public health emergency.  Safety protocols were in place, including screening questions prior to the visit, additional usage of staff PPE, and extensive cleaning of exam room while observing appropriate contact time as indicated for disinfecting solutions.    HPI She is here for a physical exam.     Medications and allergies reviewed with patient and updated if appropriate.  Patient Active Problem List   Diagnosis Date Noted  . Uterine prolapse 08/28/2019  . Atypical mole 07/20/2018  . Urinary frequency 01/19/2018  . Submandibular gland swelling 01/19/2018  . Leg cramps 01/14/2016  . Thyroid nodule 05/15/2013  . History of TIA (transient ischemic attack) 05/08/2013  . MILD NONPROLIFERATIVE DIABETIC RETINOPATHY 02/04/2010  . ALLERGIC URTICARIA 01/20/2009  . Essential hypertension 01/03/2009  . Diabetes mellitus type 2 with retinopathy ([place]) 01/02/2009    Current Outpatient Medications on File Prior to Visit  Medication Sig Dispense Refill  . amLODipine (NORVASC) 5 MG tablet Take 1 tablet by mouth once daily 90 tablet 1  . aspirin EC 325 MG tablet Take 1 tablet (325 mg total) by mouth daily. 100 tablet 3  . Continuous Blood Gluc Sensor (FREESTYLE LIBRE 14 DAY SENSOR) MISC 1 each by Does not apply route daily. E11.9 2 each 5  . Continuous Blood Gluc Sensor ([place]) MISC Use as directed to check sugars.  Dx  E11.9 with hyperglycemia 1 each 0  . GARLIC-CALCIUM PO Take 1 tablet by mouth daily.    . metFORMIN (GLUCOPHAGE) 1000 MG tablet TAKE 1 TABLET BY MOUTH TWICE DAILY WITH MEALS 180 tablet 0  . Propylene Glycol (SYSTANE BALANCE OP) Place 1 drop into both eyes daily.     No current facility-administered medications on file prior to visit.    Past Medical History:  Diagnosis Date  . Allergic urticaria   .  Diabetes mellitus, type 2 ([place])   . Hyperlipidemia   . Hypertension   . 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 Thyroid nodule 05/15/2013   Right side, IR bx 2/99: benign follicular nodule    Past Surgical History:  Procedure Laterality Date  . CATARACT EXTRACTION  12/2008  . Vetrectomy  2009    Social History   Socioeconomic History  . Marital status: Married    Spouse name: Not on file  . Number of children: 3  . Years of education: Not on file  . Highest education level: Not on file  Occupational History  . Not on file  Tobacco Use  . Smoking status: Never Smoker  . Smokeless tobacco: Never Used  Vaping Use  . Vaping Use: Never used  Substance and Sexual Activity  . Alcohol use: No  . Drug use: No  . Sexual activity: Not Currently    Comment: 1st intercourse 80 yo-Fewer than 5 partnbers  Other Topics Concern  . Not on file  Social History Narrative   Married, lives with spouse and their grown son. 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 Breast cancer Mother   . Heart Problems Sister     Review of Systems     Objective:  There were no vitals filed for this visit. There were no vitals filed for this visit. There is no height or weight on file to calculate BMI.  BP Readings from Last 3 Encounters:  11/29/19 116/70  08/28/19 126/68  08/23/19 (!) 135/109    Wt Readings from Last 3 Encounters:  11/29/19 151 lb (68.5 kg)  08/28/19 149 lb (67.6 kg)  08/23/19 143 lb (64.9 kg)     Physical Exam Constitutional:  She appears well-developed and well-nourished. No distress.  HENT:  Head: Normocephalic and atraumatic.  Right Ear: External ear normal. Normal ear canal and TM Left Ear: External ear normal.  Normal ear canal and TM Mouth/Throat: Oropharynx is clear and moist.  Eyes: Conjunctivae and EOM are normal.  Neck: Neck supple. No tracheal deviation present. No thyromegaly present.  No carotid bruit  Cardiovascular: Normal rate, regular rhythm and normal heart sounds.   No murmur heard.  No edema. Pulmonary/Chest: Effort normal and breath sounds normal. No respiratory distress. She has no wheezes. She has no rales.  Breast: deferred   Abdominal: Soft. She exhibits no distension. There is no tenderness.  Lymphadenopathy: She has no cervical adenopathy.  Skin: Skin is warm and dry. She is not diaphoretic.  Psychiatric: She has a normal mood and affect. Her behavior is normal.        Assessment & Plan:   Physical exam: Screening blood work    ordered Immunizations  ? Flu vac, discussed shingrix and covid booster - due next month Colonoscopy  N/a due to age 27   Gyn  Up to date Dexa  Up to date  Eye exams   Exercise   Weight   Substance abuse  none Sees derm annually     See Problem List for Assessment and Plan of chronic medical problems.      This encounter was created in error - please disregard.

## 2020-03-02 NOTE — Patient Instructions (Signed)
Blood work was ordered.     No immunization administered today.   Medications changes include :     Your prescription(s) have been submitted to your pharmacy.    A referral was ordered for      Someone will call you to schedule an appointment.    Please followup in 6 months    Health Maintenance, Female Adopting a healthy lifestyle and getting preventive care are important in promoting health and wellness. Ask your health care provider about:  The right schedule for you to have regular tests and exams.  Things you can do on your own to prevent diseases and keep yourself healthy. What should I know about diet, weight, and exercise? Eat a healthy diet  Eat a diet that includes plenty of vegetables, fruits, low-fat dairy products, and lean protein.  Do not eat a lot of foods that are high in solid fats, added sugars, or sodium.   Maintain a healthy weight Body mass index (BMI) is used to identify weight problems. It estimates body fat based on height and weight. Your health care provider can help determine your BMI and help you achieve or maintain a healthy weight. Get regular exercise Get regular exercise. This is one of the most important things you can do for your health. Most adults should:  Exercise for at least 150 minutes each week. The exercise should increase your heart rate and make you sweat (moderate-intensity exercise).  Do strengthening exercises at least twice a week. This is in addition to the moderate-intensity exercise.  Spend less time sitting. Even light physical activity can be beneficial. Watch cholesterol and blood lipids Have your blood tested for lipids and cholesterol at 79 years of age, then have this test every 5 years. Have your cholesterol levels checked more often if:  Your lipid or cholesterol levels are high.  You are older than 79 years of age.  You are at high risk for heart disease. What should I know about cancer  screening? Depending on your health history and family history, you may need to have cancer screening at various ages. This may include screening for:  Breast cancer.  Cervical cancer.  Colorectal cancer.  Skin cancer.  Lung cancer. What should I know about heart disease, diabetes, and high blood pressure? Blood pressure and heart disease  High blood pressure causes heart disease and increases the risk of stroke. This is more likely to develop in people who have high blood pressure readings, are of African descent, or are overweight.  Have your blood pressure checked: ? Every 3-5 years if you are 18-39 years of age. ? Every year if you are 40 years old or older. Diabetes Have regular diabetes screenings. This checks your fasting blood sugar level. Have the screening done:  Once every three years after age 40 if you are at a normal weight and have a low risk for diabetes.  More often and at a younger age if you are overweight or have a high risk for diabetes. What should I know about preventing infection? Hepatitis B If you have a higher risk for hepatitis B, you should be screened for this virus. Talk with your health care provider to find out if you are at risk for hepatitis B infection. Hepatitis C Testing is recommended for:  Everyone born from 1945 through 1965.  Anyone with known risk factors for hepatitis C. Sexually transmitted infections (STIs)  Get screened for STIs, including gonorrhea and chlamydia, if: ? You are   sexually active and are younger than 79 years of age. ? You are older than 79 years of age and your health care provider tells you that you are at risk for this type of infection. ? Your sexual activity has changed since you were last screened, and you are at increased risk for chlamydia or gonorrhea. Ask your health care provider if you are at risk.  Ask your health care provider about whether you are at high risk for HIV. Your health care provider may  recommend a prescription medicine to help prevent HIV infection. If you choose to take medicine to prevent HIV, you should first get tested for HIV. You should then be tested every 3 months for as long as you are taking the medicine. Pregnancy  If you are about to stop having your period (premenopausal) and you may become pregnant, seek counseling before you get pregnant.  Take 400 to 800 micrograms (mcg) of folic acid every day if you become pregnant.  Ask for birth control (contraception) if you want to prevent pregnancy. Osteoporosis and menopause Osteoporosis is a disease in which the bones lose minerals and strength with aging. This can result in bone fractures. If you are 79 years old or older, or if you are at risk for osteoporosis and fractures, ask your health care provider if you should:  Be screened for bone loss.  Take a calcium or vitamin D supplement to lower your risk of fractures.  Be given hormone replacement therapy (HRT) to treat symptoms of menopause. Follow these instructions at home: Lifestyle  Do not use any products that contain nicotine or tobacco, such as cigarettes, e-cigarettes, and chewing tobacco. If you need help quitting, ask your health care provider.  Do not use street drugs.  Do not share needles.  Ask your health care provider for help if you need support or information about quitting drugs. Alcohol use  Do not drink alcohol if: ? Your health care provider tells you not to drink. ? You are pregnant, may be pregnant, or are planning to become pregnant.  If you drink alcohol: ? Limit how much you use to 0-1 drink a day. ? Limit intake if you are breastfeeding.  Be aware of how much alcohol is in your drink. In the [place] one drink equals one 12 oz bottle of beer (355 mL), one 5 oz glass of wine (148 mL), or one 1 oz glass of hard liquor (44 mL). General instructions  Schedule regular health, dental, and eye exams.  Stay current with your  vaccines.  Tell your health care provider if: ? You often feel depressed. ? You have ever been abused or do not feel safe at home. Summary  Adopting a healthy lifestyle and getting preventive care are important in promoting health and wellness.  Follow your health care provider's instructions about healthy diet, exercising, and getting tested or screened for diseases.  Follow your health care provider's instructions on monitoring your cholesterol and blood pressure. This information is not intended to replace advice given to you by your health care provider. Make sure you discuss any questions you have with your health care provider. Document Revised: 01/25/2018 Document Reviewed: 01/25/2018 Elsevier Patient Education  2021 [employer].

## 2020-03-04 ENCOUNTER — Encounter: Payer: Medicare Other | Admitting: Internal Medicine

## 2020-03-04 DIAGNOSIS — Z0000 Encounter for general adult medical examination without abnormal findings: Secondary | ICD-10-CM

## 2020-03-04 DIAGNOSIS — E113299 Type 2 diabetes mellitus with mild nonproliferative diabetic retinopathy without macular edema, unspecified eye: Secondary | ICD-10-CM

## 2020-03-04 DIAGNOSIS — I10 Essential (primary) hypertension: Secondary | ICD-10-CM

## 2020-03-04 DIAGNOSIS — E113599 Type 2 diabetes mellitus with proliferative diabetic retinopathy without macular edema, unspecified eye: Secondary | ICD-10-CM

## 2020-03-27 ENCOUNTER — Other Ambulatory Visit: Payer: Self-pay

## 2020-03-27 ENCOUNTER — Ambulatory Visit
Admission: RE | Admit: 2020-03-27 | Discharge: 2020-03-27 | Disposition: A | Discharge status: Home / Self Care | Payer: Medicare Other | Source: Ambulatory Visit | Attending: Internal Medicine | Admitting: Internal Medicine

## 2020-03-27 ENCOUNTER — Ambulatory Visit: Payer: Medicare Other

## 2020-03-27 DIAGNOSIS — R928 Other abnormal and inconclusive findings on diagnostic imaging of breast: Secondary | ICD-10-CM

## 2020-03-27 DIAGNOSIS — R922 Inconclusive mammogram: Secondary | ICD-10-CM | POA: Diagnosis not present

## 2020-04-10 DIAGNOSIS — E113292 Type 2 diabetes mellitus with mild nonproliferative diabetic retinopathy without macular edema, left eye: Secondary | ICD-10-CM | POA: Diagnosis not present

## 2020-04-10 DIAGNOSIS — E113291 Type 2 diabetes mellitus with mild nonproliferative diabetic retinopathy without macular edema, right eye: Secondary | ICD-10-CM | POA: Diagnosis not present

## 2020-04-10 DIAGNOSIS — H35373 Puckering of macula, bilateral: Secondary | ICD-10-CM | POA: Diagnosis not present

## 2020-04-10 LAB — HM DIABETES EYE EXAM

## 2020-04-14 ENCOUNTER — Other Ambulatory Visit: Payer: Self-pay | Admitting: Internal Medicine

## 2020-04-16 ENCOUNTER — Other Ambulatory Visit: Payer: Self-pay | Admitting: Internal Medicine

## 2020-04-24 ENCOUNTER — Encounter: Payer: Self-pay | Admitting: Internal Medicine

## 2020-04-24 NOTE — Progress Notes (Signed)
Outside notes received. Information abstracted. Notes sent to scan.  

## 2020-07-11 ENCOUNTER — Other Ambulatory Visit: Payer: Self-pay | Admitting: Internal Medicine

## 2020-07-18 ENCOUNTER — Other Ambulatory Visit: Payer: Self-pay | Admitting: Internal Medicine

## 2020-08-21 ENCOUNTER — Other Ambulatory Visit: Payer: Self-pay

## 2020-08-21 ENCOUNTER — Emergency Department (HOSPITAL_COMMUNITY): Payer: Medicare Other

## 2020-08-21 ENCOUNTER — Emergency Department (HOSPITAL_COMMUNITY)
Admission: EM | Admit: 2020-08-21 | Discharge: 2020-08-21 | Disposition: A | Discharge status: Home / Self Care | Payer: Medicare Other | Attending: Emergency Medicine | Admitting: Emergency Medicine

## 2020-08-21 DIAGNOSIS — Z743 Need for continuous supervision: Secondary | ICD-10-CM | POA: Diagnosis not present

## 2020-08-21 DIAGNOSIS — R42 Dizziness and giddiness: Secondary | ICD-10-CM | POA: Diagnosis not present

## 2020-08-21 DIAGNOSIS — I10 Essential (primary) hypertension: Secondary | ICD-10-CM | POA: Diagnosis not present

## 2020-08-21 DIAGNOSIS — Z7982 Long term (current) use of aspirin: Secondary | ICD-10-CM | POA: Diagnosis not present

## 2020-08-21 DIAGNOSIS — E11319 Type 2 diabetes mellitus with unspecified diabetic retinopathy without macular edema: Secondary | ICD-10-CM | POA: Diagnosis not present

## 2020-08-21 DIAGNOSIS — R410 Disorientation, unspecified: Secondary | ICD-10-CM | POA: Diagnosis not present

## 2020-08-21 DIAGNOSIS — T671XXA Heat syncope, initial encounter: Secondary | ICD-10-CM | POA: Diagnosis present

## 2020-08-21 DIAGNOSIS — Z7984 Long term (current) use of oral hypoglycemic drugs: Secondary | ICD-10-CM | POA: Insufficient documentation

## 2020-08-21 DIAGNOSIS — R6889 Other general symptoms and signs: Secondary | ICD-10-CM | POA: Diagnosis not present

## 2020-08-21 DIAGNOSIS — Z79899 Other long term (current) drug therapy: Secondary | ICD-10-CM | POA: Diagnosis not present

## 2020-08-21 DIAGNOSIS — R404 Transient alteration of awareness: Secondary | ICD-10-CM | POA: Diagnosis not present

## 2020-08-21 DIAGNOSIS — R55 Syncope and collapse: Secondary | ICD-10-CM | POA: Diagnosis not present

## 2020-08-21 LAB — BASIC METABOLIC PANEL
Anion gap: 9 (ref 5–15)
BUN: 26 mg/dL — ABNORMAL HIGH (ref 8–23)
CO2: 27 mmol/L (ref 22–32)
Calcium: 9.7 mg/dL (ref 8.9–10.3)
Chloride: 103 mmol/L (ref 98–111)
Creatinine, Ser: 1.21 mg/dL — ABNORMAL HIGH (ref 0.44–1.00)
GFR, Estimated: 46 mL/min — ABNORMAL LOW (ref >60)
Glucose, Bld: 268 mg/dL — ABNORMAL HIGH (ref 70–99)
Potassium: 4.5 mmol/L (ref 3.5–5.1)
Sodium: 139 mmol/L (ref 135–145)

## 2020-08-21 LAB — CBC
HCT: 38.9 % (ref 36.0–46.0)
Hemoglobin: 12.8 g/dL (ref 12.0–15.0)
MCH: 31.4 pg (ref 26.0–34.0)
MCHC: 32.9 g/dL (ref 30.0–36.0)
MCV: 95.3 fL (ref 80.0–100.0)
Platelets: 192 10*3/uL (ref 150–400)
RBC: 4.08 MIL/uL (ref 3.87–5.11)
RDW: 14.6 % (ref 11.5–15.5)
WBC: 7.1 10*3/uL (ref 4.0–10.5)
nRBC: 0 % (ref 0.0–0.2)

## 2020-08-21 LAB — CBG MONITORING, ED: Glucose-Capillary: 265 mg/dL — ABNORMAL HIGH (ref 70–99)

## 2020-08-21 LAB — TROPONIN I (HIGH SENSITIVITY): Troponin I (High Sensitivity): 3 ng/L (ref <18)

## 2020-08-21 MED ORDER — SODIUM CHLORIDE 0.9 % IV BOLUS
500.0000 mL | Freq: Once | INTRAVENOUS | Status: AC
  Administered 2020-08-21: 500 mL via INTRAVENOUS

## 2020-08-21 NOTE — Discharge Instructions (Addendum)
Return for any problem.  ?

## 2020-08-21 NOTE — ED Triage Notes (Signed)
Patient BIB [facility] from home.  Family called EMS due to fall passing out and falling.  Patient was outside working in the garden when this happened.  Family feels she is possibly dehydrated due to not drinking enough water today.  Patient did not take her daily meds this morning.    VS were 134/84 82-HR 96% room air 247-CBG

## 2020-08-21 NOTE — ED Notes (Signed)
Patient has an abrasion on her left elbow from fall.

## 2020-08-21 NOTE — ED Provider Notes (Signed)
[facility] DEPT Provider Note   CSN: [id number] Arrival date & Mccullough: 08/21/20  1405     History Chief Complaint  Patricia Mccullough presents with   Loss of Mccullough   possibly dehydration    Patricia Mccullough is a 80 y.o. female.  80 year old female with prior medical history as detailed below presents after syncopal event.  Patricia Mccullough.  Patricia Mccullough then syncopized.  Patricia Mccullough denies symptoms prior to syncope.  Patricia Mccullough syncopal period was brief.  Patricia Mccullough was unconscious for less than 5 minutes.  Patricia Mccullough now has no complaint.  Patricia Mccullough denies associated chest pain, palpitations, shortness of breath, recent illness, or other complaint.  The history is provided by the Patricia Mccullough Patricia medical records.  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   Thyroid nodule 05/15/2013   Right side, IR bx 5/73: benign follicular nodule    Patricia Mccullough Active Problem List   Diagnosis Date Noted   Uterine prolapse 08/28/2019   Atypical mole 07/20/2018   Urinary frequency 01/19/2018   Submandibular gland swelling 01/19/2018   Leg cramps 01/14/2016   Thyroid nodule 05/15/2013   History of TIA (transient ischemic attack) 05/08/2013   Mild nonproliferative diabetic retinopathy ([place]) 02/04/2010   ALLERGIC URTICARIA 01/20/2009   Essential hypertension 01/03/2009   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/02/2009    Past Surgical History:  Procedure Laterality Date   CATARACT EXTRACTION  12/2008   Vetrectomy  2009      OB History     Gravida  2   Para  2   Term  2   Preterm      AB      Living  2      SAB      IAB      Ectopic      Multiple      Live Births              Family History  Problem Relation Age of Onset   Breast cancer Mother    Heart Problems Sister     Social History   Tobacco Use   Smoking status: Never   Smokeless tobacco: Never  Vaping Use   Vaping Use: Never used  Substance Use Topics   Alcohol use: No   Drug use: No    Home Medications Prior to Admission medications   Medication Sig Start Date End Date Taking? Authorizing Provider  amLODipine (NORVASC) 5 MG tablet Take 1 tablet (5 mg total) by mouth daily. Annual appt due in July must see provider for future refills 07/11/20   Binnie Rail, MD  aspirin EC 325 MG tablet Take 1 tablet (325 mg total) by mouth daily. 05/08/13   Rowe Clack, MD  Continuous Blood Gluc Sensor (FREESTYLE LIBRE 14 DAY SENSOR) MISC 1 each by Does not apply route daily. E11.9 08/28/19   Binnie Rail, MD  Continuous Blood Gluc Sensor ([place]) MISC Use as directed to  check sugars.  Dx  E11.9 with hyperglycemia 07/20/18   Binnie Rail, MD  GARLIC-CALCIUM PO Take 1 tablet by mouth daily.    [provider]  metFORMIN (GLUCOPHAGE) 1000 MG tablet TAKE 1 TABLET BY MOUTH TWICE DAILY WITH MEALS 02/20/20   Burns, Claudina Lick, MD  Propylene Glycol (SYSTANE BALANCE OP) Patricia Mccullough 1 drop into both eyes daily.    [provider]    Allergies    Patricia Mccullough has no known allergies.  Review of Systems   Review of Systems  Cardiovascular:  Positive for syncope.  All other systems reviewed Patricia are negative.  Physical Exam Updated Vital Signs BP (!) 143/68   Pulse 80   Temp 98.2 F (36.8 C)   Resp 16   Ht 5\' 3"  (1.6 m)   Wt 65.8 kg   SpO2 97%   BMI 25.69 kg/m   Physical Exam Vitals Patricia nursing note reviewed.  Constitutional:      General: Patricia Mccullough is not in acute distress.    Appearance:  Normal appearance. Patricia Mccullough is well-developed.  HENT:     Head: Normocephalic Patricia atraumatic.  Eyes:     Conjunctiva/sclera: Conjunctivae normal.     Pupils: Pupils are equal, round, Patricia reactive to light.  Cardiovascular:     Rate Patricia Rhythm: Normal rate Patricia regular rhythm.     Heart sounds: Normal heart sounds.  Pulmonary:     Effort: Pulmonary effort is normal. No respiratory distress.     Breath sounds: Normal breath sounds.  Abdominal:     General: There is no distension.     Palpations: Abdomen is soft.     Tenderness: There is no abdominal tenderness.  Musculoskeletal:        General: No deformity. Normal range of motion.     Cervical back: Normal range of motion Patricia neck supple.  Skin:    General: Skin is warm Patricia dry.  Neurological:     General: No focal deficit present.     Mental Status: Patricia Mccullough, Patricia Mccullough, Patricia Mccullough.    ED Results / Procedures / Treatments   Labs (all labs ordered are listed, but only abnormal results are displayed) Labs Reviewed  CBG MONITORING, ED - Abnormal; Notable for the following components:      Result Value   Glucose-Capillary 265 (*)    All other components within normal limits  CBC  BASIC METABOLIC PANEL  URINALYSIS, ROUTINE W REFLEX MICROSCOPIC  TROPONIN I (HIGH SENSITIVITY)    EKG EKG Interpretation  Date/Mccullough:  Thursday August 21 2020 14:57:13 EDT Ventricular Rate:  79 PR Interval:  222 QRS Duration: 112 QT Interval:  383 QTC Calculation: 439 R Axis:   -37 Text Interpretation: Sinus rhythm Prolonged PR interval LVH with IVCD, LAD Patricia secondary repol abnrm Confirmed by Dene Gentry [phone]) on 08/21/2020 2:59:38 PM  Radiology No results found.  Procedures Procedures   Medications Ordered in ED Medications  sodium chloride 0.9 % bolus 500 mL (has no administration in Mccullough range)    ED Course  I have reviewed the triage vital signs Patricia the nursing notes.  Pertinent labs & imaging results that were  available during my care of the Patricia Mccullough were reviewed by me Patricia considered in my medical decision making (see chart for details).    MDM Rules/Calculators/A&P  MDM  MSE complete  Abri Vacca was evaluated in Emergency Department on 08/21/2020 for the symptoms described in the history of present illness. Patricia Mccullough was evaluated in the context of the global COVID-19 pandemic, which necessitated consideration that the Patricia Mccullough might be at risk for infection with the SARS-CoV-2 virus that causes COVID-19. Institutional protocols Patricia algorithms that pertain to the evaluation of patients at risk for COVID-19 are in a state of rapid change based on information released by regulatory bodies including the CDC Patricia federal Patricia state organizations. These policies Patricia algorithms were followed during the Patricia Mccullough's care in the ED.  Patricia Mccullough presented for evaluation of brief syncopal event.  Patricia Mccullough was working outside in very high ambient temperatures.  Presentation is consistent with likely heat related syncope.  Patricia Mccullough is without complaint upon evaluation  Screening labs are without significant abnormality.  Patricia Mccullough does feel improved after ED evaluation.  Patricia Mccullough now desires discharge home.  Portance of close follow-Mccullough is stressed.  Patricia Mccullough is advised to drink plenty of fluids Patricia to avoid exertion in high temperatures.   Final Clinical Impression(s) / ED Diagnoses Final diagnoses:  Heat syncope, initial encounter    Rx / DC Orders ED Discharge Orders     None        Valarie Merino, MD 08/21/20 [phone]

## 2020-08-24 ENCOUNTER — Emergency Department (HOSPITAL_COMMUNITY): Payer: Medicare Other

## 2020-08-24 ENCOUNTER — Encounter (HOSPITAL_COMMUNITY): Payer: Self-pay | Admitting: Emergency Medicine

## 2020-08-24 ENCOUNTER — Emergency Department (HOSPITAL_COMMUNITY)
Admission: EM | Admit: 2020-08-24 | Discharge: 2020-08-24 | Disposition: A | Discharge status: Home / Self Care | Payer: Medicare Other | Attending: Emergency Medicine | Admitting: Emergency Medicine

## 2020-08-24 ENCOUNTER — Other Ambulatory Visit: Payer: Self-pay

## 2020-08-24 DIAGNOSIS — E113299 Type 2 diabetes mellitus with mild nonproliferative diabetic retinopathy without macular edema, unspecified eye: Secondary | ICD-10-CM | POA: Insufficient documentation

## 2020-08-24 DIAGNOSIS — Z7982 Long term (current) use of aspirin: Secondary | ICD-10-CM | POA: Diagnosis not present

## 2020-08-24 DIAGNOSIS — Z7984 Long term (current) use of oral hypoglycemic drugs: Secondary | ICD-10-CM | POA: Insufficient documentation

## 2020-08-24 DIAGNOSIS — I10 Essential (primary) hypertension: Secondary | ICD-10-CM | POA: Diagnosis not present

## 2020-08-24 DIAGNOSIS — Z79899 Other long term (current) drug therapy: Secondary | ICD-10-CM | POA: Insufficient documentation

## 2020-08-24 DIAGNOSIS — R519 Headache, unspecified: Secondary | ICD-10-CM | POA: Diagnosis not present

## 2020-08-24 NOTE — Discharge Instructions (Addendum)
CT scan today showed only mild age-related changes and no traumatic injury.  If your condition worsens or you have other concerns please return here for further evaluation.  Use Tylenol, every 4 hours as needed for persistent headache.  Gradually advance your activity.  Because of prolonged standing and walking, and sit down immediately if you feel dizzy or weak.  Avoid bending and squatting, for now.  Continue taking your usual medications.  Follow-up with your primary care doctor as needed for problems.

## 2020-08-24 NOTE — ED Provider Notes (Signed)
[facility] DEPT Provider Note   CSN: [id number] Arrival date & time: 08/24/20  1455     History No chief complaint on file.   Patricia Mccullough is a 80 y.o. female.  HPI Presents for evaluation of persistent headache, despite symptomatic treatment at home.  She was evaluated after he related syncope, on 08/21/2020.  At that time she was discharged with symptomatic treatment.  Patient is here after speaking with her daughter who is a physician for concern of intracranial bleeding.  Patient relates that she is having mild persistent posterior headache.  She denies pain with neck movement.  She denies weakness, paresthesias, nausea, vomiting, blurred vision or double vision.  She takes aspirin but no anticoagulants.  She denies other illnesses.  There are no other known active modifying factors.    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   Thyroid nodule 05/15/2013   Right side, IR bx 9/48: benign follicular nodule    Patient Active Problem List   Diagnosis Date Noted   Uterine prolapse 08/28/2019   Atypical mole 07/20/2018   Urinary frequency 01/19/2018   Submandibular gland swelling 01/19/2018   Leg cramps 01/14/2016   Thyroid nodule 05/15/2013   History of TIA (transient ischemic attack) 05/08/2013   Mild nonproliferative diabetic retinopathy ([place]) 02/04/2010   ALLERGIC URTICARIA 01/20/2009   Essential hypertension 01/03/2009   Diabetes mellitus type 2 with retinopathy ([place]) 01/02/2009    Past Surgical History:  Procedure Laterality Date   CATARACT EXTRACTION  12/2008   Vetrectomy  2009     OB History     Gravida  2   Para  2   Term  2   Preterm      AB      Living  2      SAB      IAB      Ectopic      Multiple       Live Births              Family History  Problem Relation Age of Onset   Breast cancer Mother    Heart Problems Sister     Social History   Tobacco Use   Smoking status: Never   Smokeless tobacco: Never  Vaping Use   Vaping Use: Never used  Substance Use Topics   Alcohol use: No   Drug use: No    Home Medications Prior to Admission medications   Medication Sig Start Date End Date Taking? Authorizing Provider  amLODipine (NORVASC) 5 MG tablet Take 1 tablet (5 mg total) by mouth daily. Annual appt due in July must see provider for future refills 07/11/20   Binnie Rail, MD  aspirin EC 325 MG tablet Take 1 tablet (325 mg total) by mouth daily. 05/08/13   Rowe Clack, MD  Continuous Blood Gluc Sensor (FREESTYLE LIBRE 14 DAY SENSOR) MISC 1 each by Does not apply route daily. E11.9 08/28/19   Binnie Rail, MD  Continuous Blood Gluc Sensor ([place]) MISC Use as directed to check sugars.  Dx  E11.9 with hyperglycemia 07/20/18   Binnie Rail, MD  GARLIC-CALCIUM PO Take 1 tablet by mouth daily.    [provider]  metFORMIN (  GLUCOPHAGE) 1000 MG tablet TAKE 1 TABLET BY MOUTH TWICE DAILY WITH MEALS 02/20/20   Burns, Claudina Lick, MD  Propylene Glycol (SYSTANE BALANCE OP) Place 1 drop into both eyes daily.    [provider]    Allergies    Patient has no known allergies.  Review of Systems   Review of Systems  All other systems reviewed and are negative.  Physical Exam Updated Vital Signs BP 130/85   Pulse 76   Temp 98 F (36.7 C) (Oral)   Resp 18   SpO2 99%   Physical Exam Vitals and nursing note reviewed.  Constitutional:      General: She is not in acute distress.    Appearance: She is well-developed. She is not ill-appearing, toxic-appearing or diaphoretic.  HENT:     Head: Normocephalic and atraumatic.     Nose: No congestion.     Mouth/Throat:     Mouth: Mucous membranes are moist.  Eyes:      Conjunctiva/sclera: Conjunctivae normal.     Pupils: Pupils are equal, round, and reactive to light.  Neck:     Trachea: Phonation normal.  Cardiovascular:     Rate and Rhythm: Normal rate and regular rhythm.  Pulmonary:     Effort: Pulmonary effort is normal.     Breath sounds: Normal breath sounds.  Chest:     Chest wall: No tenderness.  Abdominal:     General: There is no distension.     Palpations: Abdomen is soft.     Tenderness: There is no abdominal tenderness.  Musculoskeletal:        General: Normal range of motion.     Cervical back: Normal range of motion and neck supple.  Skin:    General: Skin is warm and dry.  Neurological:     Mental Status: She is alert and oriented to person, place, and time.     Motor: No abnormal muscle tone.     Comments: No dysarthria, aphasia or nystagmus.  No ataxia.  Romberg negative.  Normal strength arms and legs bilaterally.  She is able to stand and walk.  Psychiatric:        Behavior: Behavior normal.        Thought Content: Thought content normal.        Judgment: Judgment normal.    ED Results / Procedures / Treatments   Labs (all labs ordered are listed, but only abnormal results are displayed) Labs Reviewed - No data to display  EKG None  Radiology CT Head Wo Contrast  Result Date: 08/24/2020 CLINICAL DATA:  Headache since a fall three days ago. EXAM: CT HEAD WITHOUT CONTRAST TECHNIQUE: Contiguous axial images were obtained from the base of the skull through the vertex without intravenous contrast. COMPARISON:  03/20/2013 FINDINGS: Brain: Stable moderately enlarged ventricles and subarachnoid spaces. Moderate to marked patchy white matter low density in both cerebral hemispheres with mild progression. No intracranial hemorrhage, mass lesion or CT evidence of acute infarction. Vascular: No hyperdense vessel or unexpected calcification. Skull: Normal. Negative for fracture or focal lesion. Sinuses/Orbits: Status post right  cataract extraction. Mild to moderate left maxillary sinus mucosal thickening. Other: None. IMPRESSION: 1. No acute abnormality. 2. Stable moderate diffuse cerebral atrophy. 3. Moderate to marked chronic small vessel white matter ischemic changes with mild progression. Electronically Signed   By: Claudie Revering M.D.   On: 08/24/2020 16:27    Procedures Procedures   Medications Ordered in ED Medications - No data to  display  ED Course  I have reviewed the triage vital signs and the nursing notes.  Pertinent labs & imaging results that were available during my care of the patient were reviewed by me and considered in my medical decision making (see chart for details).    MDM Rules/Calculators/A&P                           Patient Vitals for the past 24 hrs:  BP Temp Temp src Pulse Resp SpO2  08/24/20 1711 130/85 -- -- 76 18 99 %  08/24/20 1531 130/85 -- -- -- -- 99 %  08/24/20 1530 130/85 -- -- 76 -- 99 %  08/24/20 1529 -- 98 F (36.7 C) Oral 77 18 99 %    5:41 PM Reevaluation with update and discussion. After initial assessment and treatment, an updated evaluation reveals no change in clinical status, findings cussed with patient and family members, all questions were answered. Daleen Bo   Medical Decision Making:  This patient is presenting for, CT imaging, observation and reassessment of headache after an episode of syncope with head injury, 3 days ago, which does require a range of treatment options, and is a complaint that involves a moderate risk of morbidity and mortality. The differential diagnoses include intracranial bleeding, pre-existing intracranial disorder, contusion head, neck injury. I decided to review old records, and in summary elderly female with a recent episode of syncope after being outside for about 30 minutes in the heat.  She was previously evaluated for this and discharged.  She did not have advanced imaging at that time.  She is on amlodipine for high  blood pressure however she does not describe orthostatic symptoms.  She does not have ocular or gastrointestinal symptoms. I obtain additional historical information from her daughter, a physician, by telephone.   Radiologic Tests Ordered, included CT head.  I independently Visualized: Radiographic images, which show no acute changes   Critical Interventions-clinical evaluation, CT imaging, observation reassessment  After These Interventions, the Patient was reevaluated and was found stable for discharge.  No evidence for intracranial bleeding or acute CNS abnormality.  Subacute head injury, 3 days ago associated with syncope after being outside in weather notable for high heat index.  Patient came here after family numbers were concerned about intracranial bleeding.  She does not show any signs of CNS compromise at this time.  She is very low risk, for progression of symptoms, and therefore will be discharged.  Suspect headache related to contusion.  CRITICAL CARE-no Performed by: Daleen Bo  Nursing Notes Reviewed/ Care Coordinated Applicable Imaging Reviewed Interpretation of Laboratory Data incorporated into ED treatment  The patient appears reasonably screened and/or stabilized for discharge and I doubt any other medical condition or other [facility] requiring further screening, evaluation, or treatment in the ED at this time prior to discharge.  Plan: Home Medications-continue usual; Home Treatments-rest, gradual advance activity; return here if the recommended treatment, does not improve the symptoms; Recommended follow up-PCP, as needed     Final Clinical Impression(s) / ED Diagnoses Final diagnoses:  Nonintractable headache, unspecified chronicity pattern, unspecified headache type    Rx / DC Orders ED Discharge Orders     None        Daleen Bo, MD 08/24/20 1744

## 2020-08-24 NOTE — ED Provider Notes (Signed)
Emergency Medicine Provider Triage Evaluation Note  Masha Orbach , a 80 y.o. female  was evaluated in triage.  Pt complains of headache occipital since Thursday was seen in ER but returned because she feels hese needs a head CT.  Review of Systems  Positive: HA occipital  Negative: fever  Physical Exam  BP 130/85 (BP Location: Right Arm)   Pulse 77   Temp 98 F (36.7 C) (Oral)   Resp 18   SpO2 99%  Gen:   Awake, no distress   Resp:  Normal effort  MSK:   Moves extremities without difficulty  Other:   Alert and oriented to self, place, time and event.   Speech is fluent, clear without dysarthria or dysphasia.   Strength 5/5 in upper/lower extremities   Sensation intact in upper/lower extremities   Normal gait.   CN I not tested  CN II grossly intact visual fields bilaterally. Did not visualize posterior eye.  CN III, IV, VI PERRLA and EOMs intact bilaterally  CN V Intact sensation to sharp and light touch to the face  CN VII facial movements symmetric  CN VIII not tested  CN IX, X no uvula deviation, symmetric rise of soft palate  CN XI 5/5 SCM and trapezius strength bilaterally  CN XII Midline tongue protrusion, symmetric L/R movements    Medical Decision Making  Medically screening exam initiated at 3:58 PM.  Appropriate orders placed.  Lema Heinkel was informed that the remainder of the evaluation will be completed by another provider, this initial triage assessment does not replace that evaluation, and the importance of remaining in the ED until their evaluation is complete.  Will obtain head CT.  Pt agreeable to plan.    Pati Gallo [place], [place] 08/24/20 1559    Daleen Bo, MD 08/24/20 2240

## 2020-08-24 NOTE — ED Triage Notes (Signed)
Patient stated she fainted 3 days age and was seen at the ED, talked to a friend who is a doctor and they recommended her to come to the ED for a head CT since they did not do it the last time, is concerned for bleeding. Patient has had a posterior headache the past three days. Has not treated it at home. Takes aspirin 324mg  3x daily.

## 2020-08-27 ENCOUNTER — Other Ambulatory Visit: Payer: Self-pay | Admitting: Internal Medicine

## 2020-09-11 NOTE — Progress Notes (Addendum)
Subjective:    Patient ID: Patricia Mccullough, female    DOB: 1940/12/08, 80 y.o.   MRN: [id number]   This visit occurred during the SARS-CoV-2 public health emergency.  Safety protocols were in place, including screening questions prior to the visit, additional usage of staff PPE, and extensive cleaning of exam room while observing appropriate contact time as indicated for disinfecting solutions.    HPI She is here for a physical exam.   She has not been compliant with a diabetic diet.  She states she lost her freestyle monitor and since then has not been compliant and is angry at herself for eating too many sugars.  She is concerned about what her sugars will be.  She has no other concerns.  Medications and allergies reviewed with patient and updated if appropriate.  Patient Active Problem List   Diagnosis Date Noted   Uterine prolapse 08/28/2019   Urinary frequency 01/19/2018   Submandibular gland swelling 01/19/2018   Leg cramps 01/14/2016   Thyroid nodule 05/15/2013   History of TIA (transient ischemic attack) 05/08/2013   Mild nonproliferative diabetic retinopathy ([place]) 02/04/2010   ALLERGIC URTICARIA 01/20/2009   Essential hypertension 01/03/2009   Diabetes mellitus type 2 with retinopathy ([place]) 01/02/2009    Current Outpatient Medications on File Prior to Visit  Medication Sig Dispense Refill   aspirin EC 325 MG tablet Take 1 tablet (325 mg total) by mouth daily. 100 tablet 3   Continuous Blood Gluc Sensor ([place]) MISC Use as directed to check sugars.  Dx  E11.9 with hyperglycemia 1 each 0   GARLIC-CALCIUM PO Take 1 tablet by mouth daily.     ibuprofen (ADVIL) 800 MG tablet ibuprofen 800 mg tablet     Propylene Glycol (SYSTANE BALANCE OP) Place 1 drop into both eyes daily.     No current facility-administered medications on file prior to visit.    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   Thyroid nodule 05/15/2013   Right side, IR bx [id number]: benign follicular nodule    Past Surgical History:  Procedure Laterality Date   CATARACT EXTRACTION  12/2008   Vetrectomy  2009    Social History   Socioeconomic History   Marital status: Married    Spouse name: Not on file   Number of children: 3   Years of education: Not on file   Highest education level: Not on file  Occupational History   Not on file  Tobacco Use   Smoking status: Never   Smokeless tobacco: Never  Vaping Use   Vaping Use: Never used  Substance and Sexual Activity   Alcohol use: No   Drug use: No   Sexual activity: Not Currently    Comment: 1st intercourse 80 yo-Fewer than 5 partnbers  Other Topics Concern   Not on file  Social History Narrative   Married, lives with spouse and their grown son. 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  Eyes:  Negative for visual disturbance.  Respiratory:  Negative for cough, shortness of breath and wheezing.   Cardiovascular:  Negative for chest pain, palpitations and leg swelling.  Gastrointestinal:  Positive for constipation (controlled). Negative for abdominal pain, blood in stool, diarrhea and nausea.        No gerd  Genitourinary:  Negative for dysuria.  Musculoskeletal:  Positive for myalgias (in morning).  Negative for arthralgias and back pain.  Skin:  Negative for color change and rash.  Neurological:  Negative for light-headedness and headaches.  Psychiatric/Behavioral:  Negative for dysphoric mood. The patient is not nervous/anxious.       Objective:   Vitals:   09/12/20 1120  BP: 128/74  Pulse: 73  Temp: 98.4 F (36.9 C)  SpO2: 99%   Filed Weights   09/12/20 1120  Weight: 154 lb (69.9 kg)   Body mass index is 27.28 kg/m.  BP Readings from Last 3 Encounters:  09/12/20 128/74  08/24/20 130/85  08/21/20 (!) 154/67    Wt Readings from Last 3 Encounters:  09/12/20 154 lb (69.9 kg)  08/21/20 145 lb (65.8 kg)  11/29/19 151 lb (68.5 kg)    Depression screen [facility] 2/9 09/12/2020 02/23/2019 01/19/2018 01/18/2017 01/18/2017  Decreased Interest 0 0 0 0 0  Down, Depressed, Hopeless 0 0 1 0 0  PHQ - 2 Score 0 0 1 0 0  Altered sleeping 0 - 1 - -  Tired, decreased energy 1 - 1 - -  Change in appetite 1 - 0 - -  Feeling bad or failure about yourself  0 - 0 - -  Trouble concentrating 0 - 0 - -  Moving slowly or fidgety/restless 0 - 0 - -  Suicidal thoughts 0 - 0 - -  PHQ-9 Score 2 - 3 - -  Difficult doing work/chores Not difficult at all - Not difficult at all - -      Physical Exam Constitutional: She appears well-developed and well-nourished. No distress.  HENT:  Head: Normocephalic and atraumatic.  Right Ear: External ear normal. Normal ear canal and TM Left Ear: External ear normal.  Normal ear canal and TM Mouth/Throat: Oropharynx is clear and moist.  Eyes: Conjunctivae and EOM are normal.  Neck: Neck supple. No tracheal deviation present. No thyromegaly present.  No carotid bruit  Cardiovascular: Normal rate, regular rhythm and normal heart sounds.   No murmur heard.  No edema. Pulmonary/Chest: Effort normal and breath sounds normal. No respiratory distress. She has no wheezes. She has no rales.  Breast: deferred   Abdominal: Soft. She exhibits no distension. There is  no tenderness.  Lymphadenopathy: She has no cervical adenopathy.  Skin: Skin is warm and dry. She is not diaphoretic.  Psychiatric: She has a normal mood and affect. Her behavior is normal.   Diabetic Foot Exam - Simple   Simple Foot Form Diabetic Foot exam was performed with the following findings: Yes 09/12/2020 12:27 PM  Visual Inspection No deformities, no ulcerations, no other skin breakdown bilaterally: Yes Sensation Testing Intact to touch and monofilament testing bilaterally: Yes Pulse Check Posterior Tibialis and Dorsalis pulse intact bilaterally: Yes Comments     Lab Results  Component Value Date   WBC 6.4  09/12/2020   HGB 12.6 09/12/2020   HCT 38.5 09/12/2020   PLT 218.0 09/12/2020   GLUCOSE 194 (H) 09/12/2020   CHOL 189 09/12/2020   TRIG 185.0 (H) 09/12/2020   HDL 61.50 09/12/2020   LDLDIRECT 44.0 02/23/2019   LDLCALC 91 09/12/2020   ALT 11 09/12/2020   AST 12 09/12/2020   NA 140 09/12/2020   K 4.5 09/12/2020   CL 103 09/12/2020   CREATININE 0.87 09/12/2020   BUN 21 09/12/2020   CO2 29 09/12/2020   TSH 1.78 09/12/2020   HGBA1C 9.6 (H) 09/12/2020   MICROALBUR 7.6 (H) 09/12/2020         Assessment & Plan:   Physical exam: Screening blood work  ordered Exercise  walking Diet   has been eating too much sugars Weight  ok for age Substance abuse  none  Advised shingrix, covid vaccines  Screened for depression using the PHQ 9 scale.  No evidence of depression.     Health Maintenance  Topic Date Due   Hepatitis C Screening  Never done   Zoster Vaccines- Shingrix (1 of 2) Never done   COVID-19 Vaccine (3 - Booster for Pfizer series) 02/25/2020   HEMOGLOBIN A1C  02/28/2020   URINE MICROALBUMIN  08/27/2020   INFLUENZA VACCINE  09/15/2020   OPHTHALMOLOGY EXAM  04/10/2021   FOOT EXAM  09/12/2021   DEXA SCAN  02/01/2022   TETANUS/TDAP  11/29/2029   PNA vac Low Risk Adult  Completed   HPV VACCINES  Aged Out          See Problem List for  Assessment and Plan of chronic medical problems.

## 2020-09-11 NOTE — Patient Instructions (Addendum)
Blood work was ordered.     Medications changes include :   none  Your prescription(s) have been submitted to your pharmacy. Please take as directed and contact our office if you believe you are having problem(s) with the medication(s).    Please followup in 6 months    Health Maintenance, Female Adopting a healthy lifestyle and getting preventive care are important in promoting health and wellness. Ask your health care provider about: The right schedule for you to have regular tests and exams. Things you can do on your own to prevent diseases and keep yourself healthy. What should I know about diet, weight, and exercise? Eat a healthy diet  Eat a diet that includes plenty of vegetables, fruits, low-fat dairy products, and lean protein. Do not eat a lot of foods that are high in solid fats, added sugars, or sodium.  Maintain a healthy weight Body mass index (BMI) is used to identify weight problems. It estimates body fat based on height and weight. Your health care provider can help determineyour BMI and help you achieve or maintain a healthy weight. Get regular exercise Get regular exercise. This is one of the most important things you can do for your health. Most adults should: Exercise for at least 150 minutes each week. The exercise should increase your heart rate and make you sweat (moderate-intensity exercise). Do strengthening exercises at least twice a week. This is in addition to the moderate-intensity exercise. Spend less time sitting. Even light physical activity can be beneficial. Watch cholesterol and blood lipids Have your blood tested for lipids and cholesterol at 79 years of age, then havethis test every 5 years. Have your cholesterol levels checked more often if: Your lipid or cholesterol levels are high. You are older than 79 years of age. You are at high risk for heart disease. What should I know about cancer screening? Depending on your health history and  family history, you may need to have cancer screening at various ages. This may include screening for: Breast cancer. Cervical cancer. Colorectal cancer. Skin cancer. Lung cancer. What should I know about heart disease, diabetes, and high blood pressure? Blood pressure and heart disease High blood pressure causes heart disease and increases the risk of stroke. This is more likely to develop in people who have high blood pressure readings, are of African descent, or are overweight. Have your blood pressure checked: Every 3-5 years if you are 16-72 years of age. Every year if you are 33 years old or older. Diabetes Have regular diabetes screenings. This checks your fasting blood sugar level. Have the screening done: Once every three years after age 85 if you are at a normal weight and have a low risk for diabetes. More often and at a younger age if you are overweight or have a high risk for diabetes. What should I know about preventing infection? Hepatitis B If you have a higher risk for hepatitis B, you should be screened for this virus. Talk with your health care provider to find out if you are at risk forhepatitis B infection. Hepatitis C Testing is recommended for: Everyone born from [birth year] through 1964. Anyone with known risk factors for hepatitis C. Sexually transmitted infections (STIs) Get screened for STIs, including gonorrhea and chlamydia, if: You are sexually active and are younger than 79 years of age. You are older than 79 years of age and your health care provider tells you that you are at risk for this type of infection. Your sexual  activity has changed since you were last screened, and you are at increased risk for chlamydia or gonorrhea. Ask your health care provider if you are at risk. Ask your health care provider about whether you are at high risk for HIV. Your health care provider may recommend a prescription medicine to help prevent HIV infection. If you choose to take  medicine to prevent HIV, you should first get tested for HIV. You should then be tested every 3 months for as long as you are taking the medicine. Pregnancy If you are about to stop having your period (premenopausal) and you may become pregnant, seek counseling before you get pregnant. Take 400 to 800 micrograms (mcg) of folic acid every day if you become pregnant. Ask for birth control (contraception) if you want to prevent pregnancy. Osteoporosis and menopause Osteoporosis is a disease in which the bones lose minerals and strength with aging. This can result in bone fractures. If you are 62 years old or older, or if you are at risk for osteoporosis and fractures, ask your health care provider if you should: Be screened for bone loss. Take a calcium or vitamin D supplement to lower your risk of fractures. Be given hormone replacement therapy (HRT) to treat symptoms of menopause. Follow these instructions at home: Lifestyle Do not use any products that contain nicotine or tobacco, such as cigarettes, e-cigarettes, and chewing tobacco. If you need help quitting, ask your health care provider. Do not use street drugs. Do not share needles. Ask your health care provider for help if you need support or information about quitting drugs. Alcohol use Do not drink alcohol if: Your health care provider tells you not to drink. You are pregnant, may be pregnant, or are planning to become pregnant. If you drink alcohol: Limit how much you use to 0-1 drink a day. Limit intake if you are breastfeeding. Be aware of how much alcohol is in your drink. In the [place] one drink equals one 12 oz bottle of beer (355 mL), one 5 oz glass of wine (148 mL), or one 1 oz glass of hard liquor (44 mL). General instructions Schedule regular health, dental, and eye exams. Stay current with your vaccines. Tell your health care provider if: You often feel depressed. You have ever been abused or do not feel safe at  home. Summary Adopting a healthy lifestyle and getting preventive care are important in promoting health and wellness. Follow your health care provider's instructions about healthy diet, exercising, and getting tested or screened for diseases. Follow your health care provider's instructions on monitoring your cholesterol and blood pressure. This information is not intended to replace advice given to you by your health care provider. Make sure you discuss any questions you have with your healthcare provider. Document Revised: 01/25/2018 Document Reviewed: 01/25/2018 Elsevier Patient Education  2022 [employer].

## 2020-09-12 ENCOUNTER — Ambulatory Visit (INDEPENDENT_AMBULATORY_CARE_PROVIDER_SITE_OTHER): Payer: Medicare Other | Admitting: Internal Medicine

## 2020-09-12 ENCOUNTER — Other Ambulatory Visit (INDEPENDENT_AMBULATORY_CARE_PROVIDER_SITE_OTHER): Payer: Medicare Other

## 2020-09-12 ENCOUNTER — Encounter: Payer: Self-pay | Admitting: Internal Medicine

## 2020-09-12 ENCOUNTER — Other Ambulatory Visit: Payer: Self-pay

## 2020-09-12 VITALS — BP 128/74 | HR 73 | Temp 98.4°F | Ht 63.0 in | Wt 154.0 lb

## 2020-09-12 DIAGNOSIS — I10 Essential (primary) hypertension: Secondary | ICD-10-CM

## 2020-09-12 DIAGNOSIS — E113599 Type 2 diabetes mellitus with proliferative diabetic retinopathy without macular edema, unspecified eye: Secondary | ICD-10-CM | POA: Diagnosis not present

## 2020-09-12 DIAGNOSIS — Z0000 Encounter for general adult medical examination without abnormal findings: Secondary | ICD-10-CM

## 2020-09-12 DIAGNOSIS — Z1331 Encounter for screening for depression: Secondary | ICD-10-CM

## 2020-09-12 DIAGNOSIS — E113299 Type 2 diabetes mellitus with mild nonproliferative diabetic retinopathy without macular edema, unspecified eye: Secondary | ICD-10-CM | POA: Diagnosis not present

## 2020-09-12 LAB — CBC WITH DIFFERENTIAL/PLATELET
Basophils Absolute: 0.1 10*3/uL (ref 0.0–0.1)
Basophils Relative: 1.2 % (ref 0.0–3.0)
Eosinophils Absolute: 0.4 10*3/uL (ref 0.0–0.7)
Eosinophils Relative: 5.8 % — ABNORMAL HIGH (ref 0.0–5.0)
HCT: 38.5 % (ref 36.0–46.0)
Hemoglobin: 12.6 g/dL (ref 12.0–15.0)
Lymphocytes Relative: 39.8 % (ref 12.0–46.0)
Lymphs Abs: 2.6 10*3/uL (ref 0.7–4.0)
MCHC: 32.8 g/dL (ref 30.0–36.0)
MCV: 94.4 fl (ref 78.0–100.0)
Monocytes Absolute: 0.4 10*3/uL (ref 0.1–1.0)
Monocytes Relative: 6.5 % (ref 3.0–12.0)
Neutro Abs: 3 10*3/uL (ref 1.4–7.7)
Neutrophils Relative %: 46.7 % (ref 43.0–77.0)
Platelets: 218 10*3/uL (ref 150.0–400.0)
RBC: 4.07 Mil/uL (ref 3.87–5.11)
RDW: 15.5 % (ref 11.5–15.5)
WBC: 6.4 10*3/uL (ref 4.0–10.5)

## 2020-09-12 LAB — COMPREHENSIVE METABOLIC PANEL
ALT: 11 U/L (ref 0–35)
AST: 12 U/L (ref 0–37)
Albumin: 4.1 g/dL (ref 3.5–5.2)
Alkaline Phosphatase: 113 U/L (ref 39–117)
BUN: 21 mg/dL (ref 6–23)
CO2: 29 mEq/L (ref 19–32)
Calcium: 10 mg/dL (ref 8.4–10.5)
Chloride: 103 mEq/L (ref 96–112)
Creatinine, Ser: 0.87 mg/dL (ref 0.40–1.20)
GFR: 63.56 mL/min (ref >60.00)
Glucose, Bld: 194 mg/dL — ABNORMAL HIGH (ref 70–99)
Potassium: 4.5 mEq/L (ref 3.5–5.1)
Sodium: 140 mEq/L (ref 135–145)
Total Bilirubin: 0.4 mg/dL (ref 0.2–1.2)
Total Protein: 7.6 g/dL (ref 6.0–8.3)

## 2020-09-12 LAB — MICROALBUMIN / CREATININE URINE RATIO
Creatinine,U: 98.1 mg/dL
Microalb Creat Ratio: 7.8 mg/g (ref 0.0–30.0)
Microalb, Ur: 7.6 mg/dL — ABNORMAL HIGH (ref 0.0–1.9)

## 2020-09-12 LAB — LIPID PANEL
Cholesterol: 189 mg/dL (ref 0–200)
HDL: 61.5 mg/dL (ref >39.00)
LDL Cholesterol: 91 mg/dL (ref 0–99)
NonHDL: 127.59
Total CHOL/HDL Ratio: 3
Triglycerides: 185 mg/dL — ABNORMAL HIGH (ref 0.0–149.0)
VLDL: 37 mg/dL (ref 0.0–40.0)

## 2020-09-12 LAB — TSH: TSH: 1.78 u[IU]/mL (ref 0.35–5.50)

## 2020-09-12 LAB — HEMOGLOBIN A1C: Hgb A1c MFr Bld: 9.6 % — ABNORMAL HIGH (ref 4.6–6.5)

## 2020-09-12 MED ORDER — AMLODIPINE BESYLATE 5 MG PO TABS: 5.0000 mg | ORAL_TABLET | Freq: Every day | ORAL | 1 refills | Status: DC

## 2020-09-12 MED ORDER — FREESTYLE LIBRE 14 DAY SENSOR MISC: 1.0000 | Freq: Every day | 5 refills | Status: DC

## 2020-09-12 MED ORDER — METFORMIN HCL 1000 MG PO TABS: 1000.0000 mg | ORAL_TABLET | Freq: Two times a day (BID) | ORAL | 1 refills | Status: DC

## 2020-09-12 NOTE — Assessment & Plan Note (Addendum)
Chronic Not controlled She also recently has not been compliant with a diabetic diet and has not been checking her sugars and most likely her sugars are not better than they were Continue metformin 1000 mg twice daily Renew freestyle so she can start monitoring her sugars Stressed compliance with a diabetic diet a1c 9.6% Add jardiance 10 mg daily - if not covered will need to try a diff med

## 2020-09-12 NOTE — Assessment & Plan Note (Signed)
Chronic She is up-to-date with her eye exams Stressed getting better control of her sugars

## 2020-09-12 NOTE — Assessment & Plan Note (Signed)
Chronic BP well controlled Continue amlodipine 5 mg daily cmp  

## 2020-09-13 MED ORDER — EMPAGLIFLOZIN 10 MG PO TABS: 10.0000 mg | ORAL_TABLET | Freq: Every day | ORAL | 5 refills | Status: DC

## 2020-09-22 ENCOUNTER — Telehealth: Payer: Self-pay | Admitting: Internal Medicine

## 2020-09-22 NOTE — Telephone Encounter (Signed)
Left message for patient to call me back at [phone] to schedule their Medicare Annual Wellness Visit (AWV-S).  Last AWV 01/19/18  Please schedule at anytime with [facility] if patient calls the office back. 45 minute appointment  Any questions, please call me at [phone]

## 2020-10-09 DIAGNOSIS — H35373 Puckering of macula, bilateral: Secondary | ICD-10-CM | POA: Diagnosis not present

## 2020-10-09 DIAGNOSIS — H26491 Other secondary cataract, right eye: Secondary | ICD-10-CM | POA: Diagnosis not present

## 2020-10-09 DIAGNOSIS — E113293 Type 2 diabetes mellitus with mild nonproliferative diabetic retinopathy without macular edema, bilateral: Secondary | ICD-10-CM | POA: Diagnosis not present

## 2020-10-09 LAB — HM DIABETES EYE EXAM

## 2020-10-23 ENCOUNTER — Encounter: Payer: Self-pay | Admitting: Internal Medicine

## 2020-10-23 NOTE — Progress Notes (Unsigned)
Outside notes received. Information abstracted. Notes sent to scan.  

## 2021-03-19 ENCOUNTER — Ambulatory Visit: Payer: Medicare Other | Admitting: Internal Medicine

## 2021-03-23 ENCOUNTER — Encounter: Payer: Self-pay | Admitting: Internal Medicine

## 2021-03-23 NOTE — Patient Instructions (Addendum)
    Blood work was ordered.      Medications changes include :   none     Please followup in 6 months  

## 2021-03-23 NOTE — Progress Notes (Signed)
-     Subjective:    Patient ID: Patricia Mccullough, female    DOB: [birth year], 81 y.o.   MRN: [id number]  This visit occurred during the SARS-CoV-2 public health emergency.  Safety protocols were in place, including screening questions prior to the visit, additional usage of staff PPE, and extensive cleaning of exam room while observing appropriate contact time as indicated for disinfecting solutions.     HPI The patient is here for follow up of their chronic medical problems, including DM, htn, h/o TIA  Her sister had two strokes and is in the ICU.   She does not always get enough sleep.  She needs to work on that.  She is not drinking enough water.  She is in a fast now which ends soon.    She is exercising.    Medications and allergies reviewed with patient and updated if appropriate.  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 90 tablet 1   aspirin EC 325 MG tablet Take 1 tablet (325 mg total) by mouth daily. 100 tablet 3   Continuous Blood Gluc Sensor (FREESTYLE LIBRE 14 DAY SENSOR) MISC 1 each by Does not apply route daily. E11.9 2 each 5   Continuous Blood Gluc Sensor ([place]) MISC Use as directed to check sugars.  Dx  E11.9 with hyperglycemia 1 each 0   GARLIC-CALCIUM PO Take 1 tablet by mouth daily.     ibuprofen (ADVIL) 800 MG tablet ibuprofen 800 mg tablet     metFORMIN  (GLUCOPHAGE) 1000 MG tablet Take 1 tablet (1,000 mg total) by mouth 2 (two) times daily with a meal. 180 tablet 1   Propylene Glycol (SYSTANE BALANCE OP) Place 1 drop into both eyes daily.     empagliflozin (JARDIANCE) 10 MG TABS tablet Take 1 tablet (10 mg total) by mouth daily before breakfast. (Patient not taking: Reported on 03/24/2021) 30 tablet 5   No current facility-administered medications on file prior to visit.    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   Thyroid nodule 05/15/2013   Right side, IR bx 4/23: benign follicular nodule    Past Surgical History:  Procedure Laterality Date   CATARACT EXTRACTION  12/2008   Vetrectomy  2009    Social History   Socioeconomic History   Marital status: Married    Spouse name: Not on file   Number of children: 3   Years of education: Not on file   Highest education level: Not on file  Occupational History   Not on file  Tobacco Use   Smoking status: Never   Smokeless tobacco: Never  Vaping Use   Vaping Use: Never used  Substance and  Sexual Activity   Alcohol use: No   Drug use: No   Sexual activity: Not Currently    Comment: 1st intercourse 81 yo-Fewer than 5 partnbers  Other Topics Concern   Not on file  Social History Narrative   Married, lives with spouse and their grown son. 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  HENT:  Positive for sneezing.   Respiratory:  Negative for cough, shortness of breath and wheezing.   Cardiovascular:  Negative for chest pain, palpitations and leg swelling.  Neurological:  Positive for dizziness (occ stands up and feels unsteady - no spinning sensation). Negative for light-headedness and headaches.      Objective:   Vitals:   03/24/21 1410  BP: 128/78  Pulse: 70  Temp: 97.9 F (36.6 C)  SpO2: 99%   BP Readings from Last 3 Encounters:  03/24/21 128/78  09/12/20 128/74  08/24/20 130/85   Wt Readings from Last 3 Encounters:  03/24/21 147 lb 9.6 oz (67 kg)  09/12/20 154 lb (69.9 kg)  08/21/20 145 lb (65.8 kg)   Body mass index is 26.15 kg/m.   Physical Exam    Constitutional: Appears well-developed and well-nourished. No distress.  HENT:  Head: Normocephalic and atraumatic.  Neck: Neck supple. No tracheal deviation present. No thyromegaly present.  No cervical lymphadenopathy Cardiovascular: Normal rate, regular rhythm and normal heart sounds.   No murmur heard. No carotid bruit .  No edema Pulmonary/Chest: Effort normal and breath sounds normal. No respiratory distress. No has no wheezes. No rales.  Skin: Skin is warm and dry. Not diaphoretic.  Psychiatric: Normal mood and affect. Behavior is normal.      Assessment & Plan:    See Problem List for Assessment and Plan of chronic medical problems.

## 2021-03-24 ENCOUNTER — Encounter: Payer: Self-pay | Admitting: Internal Medicine

## 2021-03-24 ENCOUNTER — Ambulatory Visit (INDEPENDENT_AMBULATORY_CARE_PROVIDER_SITE_OTHER): Payer: Medicare Other | Admitting: Internal Medicine

## 2021-03-24 ENCOUNTER — Other Ambulatory Visit: Payer: Self-pay

## 2021-03-24 VITALS — BP 128/78 | HR 70 | Temp 97.9°F | Ht 63.0 in | Wt 147.6 lb

## 2021-03-24 DIAGNOSIS — Z8673 Personal history of transient ischemic attack (TIA), and cerebral infarction without residual deficits: Secondary | ICD-10-CM | POA: Diagnosis not present

## 2021-03-24 DIAGNOSIS — E113599 Type 2 diabetes mellitus with proliferative diabetic retinopathy without macular edema, unspecified eye: Secondary | ICD-10-CM | POA: Diagnosis not present

## 2021-03-24 DIAGNOSIS — I10 Essential (primary) hypertension: Secondary | ICD-10-CM | POA: Diagnosis not present

## 2021-03-24 LAB — LIPID PANEL
Cholesterol: 162 mg/dL (ref 0–200)
HDL: 55.9 mg/dL (ref >39.00)
LDL Cholesterol: 80 mg/dL (ref 0–99)
NonHDL: 105.67
Total CHOL/HDL Ratio: 3
Triglycerides: 126 mg/dL (ref 0.0–149.0)
VLDL: 25.2 mg/dL (ref 0.0–40.0)

## 2021-03-24 LAB — COMPREHENSIVE METABOLIC PANEL
ALT: 13 U/L (ref 0–35)
AST: 14 U/L (ref 0–37)
Albumin: 4.1 g/dL (ref 3.5–5.2)
Alkaline Phosphatase: 65 U/L (ref 39–117)
BUN: 13 mg/dL (ref 6–23)
CO2: 31 mEq/L (ref 19–32)
Calcium: 9.6 mg/dL (ref 8.4–10.5)
Chloride: 106 mEq/L (ref 96–112)
Creatinine, Ser: 0.87 mg/dL (ref 0.40–1.20)
GFR: 63.32 mL/min (ref >60.00)
Glucose, Bld: 131 mg/dL — ABNORMAL HIGH (ref 70–99)
Potassium: 4.6 mEq/L (ref 3.5–5.1)
Sodium: 140 mEq/L (ref 135–145)
Total Bilirubin: 0.4 mg/dL (ref 0.2–1.2)
Total Protein: 7.1 g/dL (ref 6.0–8.3)

## 2021-03-24 LAB — HEMOGLOBIN A1C: Hgb A1c MFr Bld: 8.3 % — ABNORMAL HIGH (ref 4.6–6.5)

## 2021-03-24 NOTE — Assessment & Plan Note (Signed)
Chronic Check a1c Continue metformin 1000mg  bid

## 2021-03-24 NOTE — Assessment & Plan Note (Addendum)
Chronic BP well controlled Continue amloidpine 5 mg qd  cmp

## 2021-03-24 NOTE — Assessment & Plan Note (Signed)
Chronic H/o TIA BP well controlled Check a1c

## 2021-04-12 ENCOUNTER — Other Ambulatory Visit: Payer: Self-pay | Admitting: Internal Medicine

## 2021-05-07 DIAGNOSIS — E113291 Type 2 diabetes mellitus with mild nonproliferative diabetic retinopathy without macular edema, right eye: Secondary | ICD-10-CM | POA: Diagnosis not present

## 2021-05-07 DIAGNOSIS — H35373 Puckering of macula, bilateral: Secondary | ICD-10-CM | POA: Diagnosis not present

## 2021-05-07 DIAGNOSIS — H35033 Hypertensive retinopathy, bilateral: Secondary | ICD-10-CM | POA: Diagnosis not present

## 2021-05-07 DIAGNOSIS — H26491 Other secondary cataract, right eye: Secondary | ICD-10-CM | POA: Diagnosis not present

## 2021-05-15 ENCOUNTER — Other Ambulatory Visit: Payer: Self-pay | Admitting: Internal Medicine

## 2021-05-15 DIAGNOSIS — Z1231 Encounter for screening mammogram for malignant neoplasm of breast: Secondary | ICD-10-CM

## 2021-05-19 ENCOUNTER — Ambulatory Visit (INDEPENDENT_AMBULATORY_CARE_PROVIDER_SITE_OTHER): Payer: Medicare Other

## 2021-05-19 DIAGNOSIS — Z0000 Encounter for general adult medical examination without abnormal findings: Secondary | ICD-10-CM

## 2021-05-19 NOTE — Patient Instructions (Signed)
Ms. Thissen , ?Thank you for taking time to come for your Medicare Wellness Visit. I appreciate your ongoing commitment to your health goals. Please review the following plan we discussed and let me know if I can assist you in the future.  ? ?Screening recommendations/referrals: ?Colonoscopy: Not a candidate for screening due to age ?Mammogram: 03/27/2020; due every 1-2 years; scheduled for 05/20/2021 ?Bone Density: 02/01/2017; due every 5 years; normal results ?Recommended yearly ophthalmology/optometry visit for glaucoma screening and checkup ?Recommended yearly dental visit for hygiene and checkup ? ?Vaccinations: ?Influenza vaccine: Due every Fall Season ?Pneumococcal vaccine: 12/28/2011, 05/07/2014 ?Tdap vaccine: 11/30/2019; due every 10 years ?Shingles vaccine: never done   ?Covid-19: 09/01/2019, 09/25/2019 ? ?Advanced directives: No; in the process of completing ? ?Conditions/risks identified: Yes ? ?Next appointment: Please schedule your next Medicare Wellness Visit with your Nurse Health Advisor in 1 year or 366 days by calling [phone]. ? ? ?Preventive Care 56 Years and Older, Female ?Preventive care refers to lifestyle choices and visits with your health care provider that can promote health and wellness. ?What does preventive care include? ?A yearly physical exam. This is also called an annual well check. ?Dental exams once or twice a year. ?Routine eye exams. Ask your health care provider how often you should have your eyes checked. ?Personal lifestyle choices, including: ?Daily care of your teeth and gums. ?Regular physical activity. ?Eating a healthy diet. ?Avoiding tobacco and drug use. ?Limiting alcohol use. ?Practicing safe sex. ?Taking low-dose aspirin every day. ?Taking vitamin and mineral supplements as recommended by your health care provider. ?What happens during an annual well check? ?The services and screenings done by your health care provider during your annual well check will depend on  your age, overall health, lifestyle risk factors, and family history of disease. ?Counseling  ?Your health care provider may ask you questions about your: ?Alcohol use. ?Tobacco use. ?Drug use. ?Emotional well-being. ?Home and relationship well-being. ?Sexual activity. ?Eating habits. ?History of falls. ?Memory and ability to understand (cognition). ?Work and work Statistician. ?Reproductive health. ?Screening  ?You may have the following tests or measurements: ?Height, weight, and BMI. ?Blood pressure. ?Lipid and cholesterol levels. These may be checked every 5 years, or more frequently if you are over 23 years old. ?Skin check. ?Lung cancer screening. You may have this screening every year starting at age 23 if you have a 30-pack-year history of smoking and currently smoke or have quit within the past 15 years. ?Fecal occult blood test (FOBT) of the stool. You may have this test every year starting at age 81. ?Flexible sigmoidoscopy or colonoscopy. You may have a sigmoidoscopy every 5 years or a colonoscopy every 10 years starting at age 11. ?Hepatitis C blood test. ?Hepatitis B blood test. ?Sexually transmitted disease (STD) testing. ?Diabetes screening. This is done by checking your blood sugar (glucose) after you have not eaten for a while (fasting). You may have this done every 1-3 years. ?Bone density scan. This is done to screen for osteoporosis. You may have this done starting at age 54. ?Mammogram. This may be done every 1-2 years. Talk to your health care provider about how often you should have regular mammograms. ?Talk with your health care provider about your test results, treatment options, and if necessary, the need for more tests. ?Vaccines  ?Your health care provider may recommend certain vaccines, such as: ?Influenza vaccine. This is recommended every year. ?Tetanus, diphtheria, and acellular pertussis (Tdap, Td) vaccine. You may need a Td booster every  10 years. ?Zoster vaccine. You may need this  after age 26. ?Pneumococcal 13-valent conjugate (PCV13) vaccine. One dose is recommended after age 50. ?Pneumococcal polysaccharide (PPSV23) vaccine. One dose is recommended after age 43. ?Talk to your health care provider about which screenings and vaccines you need and how often you need them. ?This information is not intended to replace advice given to you by your health care provider. Make sure you discuss any questions you have with your health care provider. ?Document Released: 02/28/2015 Document Revised: 10/22/2015 Document Reviewed: 12/03/2014 ?Elsevier Interactive Patient Education ? 2017 [employer]. ? ?Fall Prevention in the Home ?Falls can cause injuries. They can happen to people of all ages. There are many things you can do to make your home safe and to help prevent falls. ?What can I do on the outside of my home? ?Regularly fix the edges of walkways and driveways and fix any cracks. ?Remove anything that might make you trip as you walk through a door, such as a raised step or threshold. ?Trim any bushes or trees on the path to your home. ?Use bright outdoor lighting. ?Clear any walking paths of anything that might make someone trip, such as rocks or tools. ?Regularly check to see if handrails are loose or broken. Make sure that both sides of any steps have handrails. ?Any raised decks and porches should have guardrails on the edges. ?Have any leaves, snow, or ice cleared regularly. ?Use sand or salt on walking paths during winter. ?Clean up any spills in your garage right away. This includes oil or grease spills. ?What can I do in the bathroom? ?Use night lights. ?Install grab bars by the toilet and in the tub and shower. Do not use towel bars as grab bars. ?Use non-skid mats or decals in the tub or shower. ?If you need to sit down in the shower, use a plastic, non-slip stool. ?Keep the floor dry. Clean up any water that spills on the floor as soon as it happens. ?Remove soap buildup in the tub or  shower regularly. ?Attach bath mats securely with double-sided non-slip rug tape. ?Do not have throw rugs and other things on the floor that can make you trip. ?What can I do in the bedroom? ?Use night lights. ?Make sure that you have a light by your bed that is easy to reach. ?Do not use any sheets or blankets that are too big for your bed. They should not hang down onto the floor. ?Have a firm chair that has side arms. You can use this for support while you get dressed. ?Do not have throw rugs and other things on the floor that can make you trip. ?What can I do in the kitchen? ?Clean up any spills right away. ?Avoid walking on wet floors. ?Keep items that you use a lot in easy-to-reach places. ?If you need to reach something above you, use a strong step stool that has a grab bar. ?Keep electrical cords out of the way. ?Do not use floor polish or wax that makes floors slippery. If you must use wax, use non-skid floor wax. ?Do not have throw rugs and other things on the floor that can make you trip. ?What can I do with my stairs? ?Do not leave any items on the stairs. ?Make sure that there are handrails on both sides of the stairs and use them. Fix handrails that are broken or loose. Make sure that handrails are as long as the stairways. ?Check any carpeting to make  sure that it is firmly attached to the stairs. Fix any carpet that is loose or worn. ?Avoid having throw rugs at the top or bottom of the stairs. If you do have throw rugs, attach them to the floor with carpet tape. ?Make sure that you have a light switch at the top of the stairs and the bottom of the stairs. If you do not have them, ask someone to add them for you. ?What else can I do to help prevent falls? ?Wear shoes that: ?Do not have high heels. ?Have rubber bottoms. ?Are comfortable and fit you well. ?Are closed at the toe. Do not wear sandals. ?If you use a stepladder: ?Make sure that it is fully opened. Do not climb a closed stepladder. ?Make  sure that both sides of the stepladder are locked into place. ?Ask someone to hold it for you, if possible. ?Clearly mark and make sure that you can see: ?Any grab bars or handrails. ?First and last steps. ?

## 2021-05-19 NOTE — Progress Notes (Signed)
?I connected with Patricia Mccullough today by telephone and verified that I am speaking with the correct person using two identifiers. ?Location patient: home ?Location provider: work ?Persons participating in the virtual visit: patient, provider. ?  ?I discussed the limitations, risks, security and privacy concerns of performing an evaluation and management service by telephone and the availability of in person appointments. I also discussed with the patient that there may be a patient responsible charge related to this service. The patient expressed understanding and verbally consented to this telephonic visit.  ?  ?Interactive audio and video telecommunications were attempted between this provider and patient, however failed, due to patient having technical difficulties OR patient did not have access to video capability.  We continued and completed visit with audio only. ? ?Some vital signs may be absent or patient reported.  ? ?Time Spent with patient on telephone encounter: 30 minutes ? ?Subjective:  ? Patricia Mccullough is a 81 y.o. female who presents for Medicare Annual (Subsequent) preventive examination. ? ?Review of Systems    ? ?Cardiac Risk Factors include: advanced age (>67mn, >>60women);diabetes mellitus;dyslipidemia;family history of premature cardiovascular disease;hypertension ? ?   ?Objective:  ?  ?There were no vitals filed for this visit. ?There is no height or weight on file to calculate BMI. ? ? ?  05/19/2021  ?  2:21 PM 08/24/2020  ?  3:34 PM 08/21/2020  ?  2:22 PM 08/23/2019  ?  1:54 PM 01/19/2018  ? 10:39 AM 01/18/2017  ? 10:48 AM  ?Advanced Directives  ?Does Patient Have a Medical Advance Directive? No No No No No No  ?Does patient want to make changes to medical advance directive?     Yes (ED - Information included in AVS)   ?Would patient like information on creating a medical advance directive? No - Patient declined No - Patient declined No - Patient declined No - Patient declined  Yes (ED -  Information included in AVS)  ? ? ?Current Medications (verified) ?Outpatient Encounter Medications as of 05/19/2021  ?Medication Sig  ? amLODipine (NORVASC) 5 MG tablet TAKE 1 TABLET BY MOUTH ONCE DAILY . APPOINTMENT REQUIRED FOR FUTURE REFILLS [place] OF  2022  ? aspirin EC 325 MG tablet Take 1 tablet (325 mg total) by mouth daily.  ? Continuous Blood Gluc Sensor (FREESTYLE LIBRE 14 DAY SENSOR) MISC 1 each by Does not apply route daily. E11.9  ? Continuous Blood Gluc Sensor (FKent MISC Use as directed to check sugars.  Dx  E11.9 with hyperglycemia  ? GARLIC-CALCIUM PO Take 1 tablet by mouth daily.  ? metFORMIN (GLUCOPHAGE) 1000 MG tablet TAKE 1 TABLET BY MOUTH TWICE DAILY WITH MEALS  ? Propylene Glycol (SYSTANE BALANCE OP) Place 1 drop into both eyes QID. Four times a day  ? ibuprofen (ADVIL) 800 MG tablet ibuprofen 800 mg tablet (Patient not taking: Reported on 05/19/2021)  ? ?No facility-administered encounter medications on file as of 05/19/2021.  ? ? ?Allergies (verified) ?Patient has no known allergies.  ? ?History: ?Past Medical History:  ?Diagnosis Date  ? Allergic urticaria   ? Diabetes mellitus, type 2 ([place]   ? Hyperlipidemia   ? Hypertension   ? 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 Submandibular swelling   ? 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 Thyroid nodule 05/15/2013  ? Right side, IR bx 57/98 benign follicular nodule  ? ?Past Surgical History:  ?Procedure Laterality Date  ?  CATARACT EXTRACTION  12/2008  ? Vetrectomy  2009  ? ?Family History  ?Problem Relation Age of Onset  ? Breast cancer Mother   ? Stroke Sister   ? Heart Problems Sister   ? ?Social History  ? ?Socioeconomic History  ? Marital status: Married  ?  Spouse name: Not on file  ? Number of children: 3  ? Years of education: Not on file  ? Highest education level: Not on file  ?Occupational History  ? Not on file  ?Tobacco Use  ? Smoking status: Never  ? Smokeless tobacco: Never   ?Vaping Use  ? Vaping Use: Never used  ?Substance and Sexual Activity  ? Alcohol use: No  ? Drug use: No  ? Sexual activity: Not Currently  ?  Comment: 1st intercourse 81 yo-Fewer than 5 partnbers  ?Other Topics Concern  ? Not on file  ?Social History Narrative  ? Married, lives with spouse and their grown son. 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 ?Social Determinants of Health  ? ?Financial Resource Strain: Low Risk   ? Difficulty of Paying Living Expenses: Not hard at all  ?Food Insecurity: No Food Insecurity  ? Worried About Charity fundraiser in the Last Year: Never true  ? Ran Out of Food in the Last Year: Never true  ?Transportation Needs: No Transportation Needs  ? Lack of Transportation (Medical): No  ? Lack of Transportation (Non-Medical): No  ?Physical Activity: Sufficiently Active  ? Days of Exercise per Week: 5 days  ? Minutes of Exercise per Session: 30 min  ?Stress: No Stress Concern Present  ? Feeling of Stress : Not at all  ?Social Connections: Socially Integrated  ? Frequency of Communication with Friends and Family: More than three times a week  ? Frequency of Social Gatherings with Friends and Family: More than three times a week  ? Attends Religious Services: More than 4 times per year  ? Active Member of Clubs or Organizations: Yes  ? Attends Archivist Meetings: More than 4 times per year  ? Marital Status: Married  ? ? ?Tobacco Counseling ?Counseling given: Not Answered ? ? ?Clinical Intake: ? ?Pre-visit preparation completed: Yes ? ?Pain : No/denies pain ? ?  ? ?Nutritional Risks: None ?Diabetes: Yes ?CBG done?: No ?Did pt. bring in CBG monitor from home?: No ? ?How often do you need to have someone help you when you read instructions, pamphlets, or other written materials from your doctor or pharmacy?: 1 - Never ?What is the last grade level you completed in school?: Ph.D in  [place] ? ?Diabetic? yes ? ?Interpreter Needed?: No ? ?Information entered by :: Lisette Abu, LPN ? ? ?Activities of Daily Living ? ?  05/19/2021  ?  2:25 PM 09/12/2020  ? 11:24 AM  ?In your present state of health, do you have any difficulty performing the following activities:  ?Hearing? 0 0  ?Vision? 0 0  ?Difficulty concentrating or making decisions? 0 0  ?Walking or climbing stairs? 0 0  ?Dressing or bathing? 0 0  ?Doing errands, shopping? 0 0  ?Preparing Food and eating ? N   ?Using the Toilet? N   ?In the past six months, have you accidently leaked urine? N   ?Do you have problems with loss of bowel control? N   ?Managing your Medications? N   ?Managing your Finances? N   ?Housekeeping  or managing your Housekeeping? N   ? ? ?Patient Care Team: ?Binnie Rail, MD as PCP - General (Internal Medicine) ?Celine Mans, MD (Ophthalmology) ?Renato Shin, MD (Endocrinology) ?Melissa Montane, MD (Otolaryngology) ? ?Indicate any recent Medical Services you may have received from other than [facility] providers in the past year (date may be approximate). ? ?   ?Assessment:  ? This is a routine wellness examination for [place]. ? ?Hearing/Vision screen ?Hearing Screening - Comments:: Patient denied any hearing difficulty.   ?No hearing aids. ? ?Vision Screening - Comments:: Patient does wear corrective lenses/contacts.  ?Eye exam done by: Dennard Nip, MD at [facility] [place] ? ? ?Dietary issues and exercise activities discussed: ?Current Exercise Habits: Home exercise routine, Type of exercise: walking, Time (Minutes): 30, Frequency (Times/Week): 5, Weekly Exercise (Minutes/Week): 150, Intensity: Moderate, Exercise limited by: None identified ? ? Goals Addressed   ?None ?  ?Depression Screen ? ?  05/19/2021  ?  2:25 PM 03/24/2021  ?  2:13 PM 09/12/2020  ? 11:26 AM 02/23/2019  ?  3:17 PM 01/19/2018  ? 12:08 PM 01/18/2017  ? 10:04 AM 01/18/2017  ?  9:08 AM  ?PHQ 2/9 Scores  ?PHQ - 2 Score 0 1 0 0 1 0 0  ?PHQ- 9 Score   2  3    ?  ?Fall  Risk ? ?  05/19/2021  ?  2:22 PM 03/24/2021  ?  2:14 PM 09/12/2020  ? 11:24 AM 02/23/2019  ?  3:17 PM 01/19/2018  ? 12:08 PM  ?Fall Risk   ?Falls in the past year? '1 1 1 '$ 0 1  ?Number falls in past yr: 0 0 0 0 0  ?[place]

## 2021-05-21 ENCOUNTER — Ambulatory Visit
Admission: RE | Admit: 2021-05-21 | Discharge: 2021-05-21 | Disposition: A | Discharge status: Home / Self Care | Payer: Medicare Other | Source: Ambulatory Visit | Attending: Internal Medicine | Admitting: Internal Medicine

## 2021-05-21 DIAGNOSIS — Z1231 Encounter for screening mammogram for malignant neoplasm of breast: Secondary | ICD-10-CM

## 2021-05-25 ENCOUNTER — Other Ambulatory Visit: Payer: Self-pay | Admitting: Internal Medicine

## 2021-05-25 DIAGNOSIS — R928 Other abnormal and inconclusive findings on diagnostic imaging of breast: Secondary | ICD-10-CM

## 2021-06-09 ENCOUNTER — Ambulatory Visit
Admission: RE | Admit: 2021-06-09 | Discharge: 2021-06-09 | Disposition: A | Discharge status: Home / Self Care | Payer: Medicare Other | Source: Ambulatory Visit | Attending: Internal Medicine | Admitting: Internal Medicine

## 2021-06-09 ENCOUNTER — Other Ambulatory Visit: Payer: Self-pay | Admitting: Internal Medicine

## 2021-06-09 DIAGNOSIS — R928 Other abnormal and inconclusive findings on diagnostic imaging of breast: Secondary | ICD-10-CM | POA: Diagnosis not present

## 2021-06-09 DIAGNOSIS — N6489 Other specified disorders of breast: Secondary | ICD-10-CM

## 2021-06-25 DIAGNOSIS — H26491 Other secondary cataract, right eye: Secondary | ICD-10-CM | POA: Diagnosis not present

## 2021-07-12 ENCOUNTER — Ambulatory Visit
Admission: EM | Admit: 2021-07-12 | Discharge: 2021-07-12 | Disposition: A | Discharge status: Home / Self Care | Payer: Medicare Other | Attending: Emergency Medicine | Admitting: Emergency Medicine

## 2021-07-12 DIAGNOSIS — J310 Chronic rhinitis: Secondary | ICD-10-CM

## 2021-07-12 DIAGNOSIS — J302 Other seasonal allergic rhinitis: Secondary | ICD-10-CM

## 2021-07-12 DIAGNOSIS — J329 Chronic sinusitis, unspecified: Secondary | ICD-10-CM | POA: Diagnosis not present

## 2021-07-12 DIAGNOSIS — H6983 Other specified disorders of Eustachian tube, bilateral: Secondary | ICD-10-CM | POA: Diagnosis not present

## 2021-07-12 MED ORDER — FLUTICASONE PROPIONATE 50 MCG/ACT NA SUSP: 1.0000 | Freq: Every day | NASAL | 1 refills | Status: DC

## 2021-07-12 MED ORDER — PROMETHAZINE-DM 6.25-15 MG/5ML PO SYRP: 2.5000 mL | ORAL_SOLUTION | Freq: Four times a day (QID) | ORAL | 0 refills | Status: DC | PRN

## 2021-07-12 MED ORDER — CETIRIZINE HCL 5 MG PO TABS: 5.0000 mg | ORAL_TABLET | Freq: Every day | ORAL | 2 refills | Status: DC

## 2021-07-12 NOTE — ED Triage Notes (Signed)
Pt presents with nasal and chest congestion for over a week.

## 2021-07-12 NOTE — Discharge Instructions (Signed)
Please see the list below for recommended medications, dosages and frequencies to provide relief of your current symptoms:    Zyrtec (cetirizine): This is an excellent second-generation antihistamine that helps to reduce respiratory inflammatory response to environmental allergens.  In some patients, this medication can cause daytime sleepiness so I recommend that you take 1 tablet daily at bedtime.     Flonase (fluticasone): This is a steroid nasal spray that you use once daily, 1 spray in each nare.  This medication does not work well if you decide to use it only used as you feel you need to, it works best used on a daily basis.  After 3 to 5 days of use, you will notice significant reduction of the inflammation and mucus production that is currently being caused by exposure to allergens, whether seasonal or environmental.  The most common side effect of this medication is nosebleeds.  If you experience a nosebleed, please discontinue use for 1 week, then feel free to resume.  I have provided you with a prescription.     Promethazine DM: Promethazine is both a nasal decongestant and an antinausea medication that makes most patients feel fairly sleepy.  The DM is dextromethorphan, a cough suppressant found in many over-the-counter cough medications.  Please take 2.5 mL before bedtime to minimize your cough which will help you sleep better.  I have provided you with a prescription for this medication.      Please follow-up within the next 5-7 days either with your primary care provider or urgent care if your symptoms do not resolve.  If you do not have a primary care provider, we will assist you in finding one.   Thank you for visiting urgent care today.  We appreciate the opportunity to participate in your care.

## 2021-07-12 NOTE — ED Provider Notes (Signed)
[facility]    CSN: [id number] Arrival date & time: 07/12/21  1329    HISTORY   Chief Complaint  Patient presents with   URI   HPI Patricia Mccullough is a 81 y.o. female. Pt presents with nasal and chest congestion for over a week.  Patient denies a history of allergies however states that when she blows her nose she hears a squeaking sound in both ears.  The history is provided by the patient.  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   Thyroid nodule 05/15/2013   Right side, IR bx 2/58: benign follicular nodule   Patient Active Problem List   Diagnosis Date Noted   Uterine prolapse 08/28/2019   Urinary frequency 01/19/2018   Submandibular gland swelling 01/19/2018   Leg cramps 01/14/2016   Thyroid nodule 05/15/2013   History of TIA (transient ischemic attack) 05/08/2013   Mild nonproliferative diabetic retinopathy ([place]) 02/04/2010   ALLERGIC URTICARIA 01/20/2009   Essential hypertension 01/03/2009   Diabetes mellitus type 2 with retinopathy ([place]) 01/02/2009   Past Surgical History:  Procedure Laterality Date   CATARACT EXTRACTION  12/2008   Vetrectomy  2009   OB History     Gravida  2   Para  2   Term  2   Preterm      AB      Living  2      SAB      IAB      Ectopic      Multiple      Live Births             Home Medications    Prior to Admission medications   Medication Sig Start Date End Date Taking? Authorizing Provider  amLODipine (NORVASC) 5 MG tablet TAKE 1 TABLET BY MOUTH ONCE DAILY . [place]  OF  2022 04/13/21   Binnie Rail, MD  aspirin EC 325 MG tablet Take 1 tablet (325 mg total) by mouth daily. 05/08/13   Rowe Clack, MD  Continuous  Blood Gluc Sensor (FREESTYLE LIBRE 14 DAY SENSOR) MISC 1 each by Does not apply route daily. E11.9 09/12/20   Binnie Rail, MD  Continuous Blood Gluc Sensor ([place]) MISC Use as directed to check sugars.  Dx  E11.9 with hyperglycemia 07/20/18   Binnie Rail, MD  GARLIC-CALCIUM PO Take 1 tablet by mouth daily.    [provider]  ibuprofen (ADVIL) 800 MG tablet ibuprofen 800 mg tablet Patient not taking: Reported on 05/19/2021    [provider]  metFORMIN (GLUCOPHAGE) 1000 MG tablet TAKE 1 TABLET BY MOUTH TWICE DAILY WITH MEALS 04/13/21   Burns, Claudina Lick, MD  Propylene Glycol (SYSTANE BALANCE OP) Place 1 drop into both eyes QID. Four times a day    [provider]   Family History Family History  Problem Relation Age of Onset   Breast cancer Mother    Stroke Sister    Heart Problems Sister    Social History Social History   Tobacco Use   Smoking status: Never   Smokeless tobacco: Never  Vaping Use   Vaping Use: Never used  Substance Use Topics  Alcohol use: No   Drug use: No   Allergies   Patient has no known allergies.  Review of Systems Review of Systems Pertinent findings noted in history of present illness.   Physical Exam Triage Vital Signs ED Triage Vitals  Enc Vitals Group     BP 12/12/20 0827 (!) 147/82     Pulse Rate 12/12/20 0827 72     Resp 12/12/20 0827 18     Temp 12/12/20 0827 98.3 F (36.8 C)     Temp Source 12/12/20 0827 Oral     SpO2 12/12/20 0827 98 %     Weight --      Height --      Head Circumference --      Peak Flow --      Pain Score 12/12/20 0826 5     Pain Loc --      Pain Edu? --      Excl. in [place]? --   No data found.  Updated Vital Signs BP 131/75 (BP Location: Left Arm)   Pulse 75   Temp 98 F (36.7 C) (Oral)   Resp 16   SpO2 96%   Physical Exam Vitals and nursing note reviewed.  Constitutional:      General: She is not in acute distress.    Appearance: Normal appearance. She  is not ill-appearing.  HENT:     Head: Normocephalic and atraumatic.     Salivary Glands: Right salivary gland is not diffusely enlarged or tender. Left salivary gland is not diffusely enlarged or tender.     Right Ear: Ear canal and external ear normal. No drainage. A middle ear effusion is present. There is no impacted cerumen. Tympanic membrane is bulging. Tympanic membrane is not injected or erythematous.     Left Ear: Ear canal and external ear normal. No drainage. A middle ear effusion is present. There is no impacted cerumen. Tympanic membrane is bulging. Tympanic membrane is not injected or erythematous.     Ears:     Comments: Bilateral EACs normal, both TMs bulging with clear fluid    Nose: Rhinorrhea present. No nasal deformity, septal deviation, signs of injury, nasal tenderness, mucosal edema or congestion. Rhinorrhea is clear.     Right Nostril: Occlusion present. No foreign body, epistaxis or septal hematoma.     Left Nostril: Occlusion present. No foreign body, epistaxis or septal hematoma.     Right Turbinates: Enlarged, swollen and pale.     Left Turbinates: Enlarged, swollen and pale.     Right Sinus: No maxillary sinus tenderness or frontal sinus tenderness.     Left Sinus: No maxillary sinus tenderness or frontal sinus tenderness.     Mouth/Throat:     Lips: Pink. No lesions.     Mouth: Mucous membranes are moist. No oral lesions.     Pharynx: Oropharynx is clear. Uvula midline. No posterior oropharyngeal erythema or uvula swelling.     Tonsils: No tonsillar exudate. 0 on the right. 0 on the left.     Comments: Postnasal drip Eyes:     General: Lids are normal.        Right eye: No discharge.        Left eye: No discharge.     Extraocular Movements: Extraocular movements intact.     Conjunctiva/sclera: Conjunctivae normal.     Right eye: Right conjunctiva is not injected.     Left eye: Left conjunctiva is not injected.  Neck:  Trachea: Trachea and phonation  normal.  Cardiovascular:     Rate and Rhythm: Normal rate and regular rhythm.     Pulses: Normal pulses.     Heart sounds: Normal heart sounds. No murmur heard.   No friction rub. No gallop.  Pulmonary:     Effort: Pulmonary effort is normal. No accessory muscle usage, prolonged expiration or respiratory distress.     Breath sounds: Normal breath sounds. No stridor, decreased air movement or transmitted upper airway sounds. No decreased breath sounds, wheezing, rhonchi or rales.  Chest:     Chest wall: No tenderness.  Musculoskeletal:        General: Normal range of motion.     Cervical back: Normal range of motion and neck supple. Normal range of motion.  Lymphadenopathy:     Cervical: No cervical adenopathy.  Skin:    General: Skin is warm and dry.     Findings: No erythema or rash.  Neurological:     General: No focal deficit present.     Mental Status: She is alert and oriented to person, place, and time.  Psychiatric:        Mood and Affect: Mood normal.        Behavior: Behavior normal.    Visual Acuity Right Eye Distance:   Left Eye Distance:   Bilateral Distance:    Right Eye Near:   Left Eye Near:    Bilateral Near:     UC Couse / Diagnostics / Procedures:    EKG  Radiology No results found.  Procedures Procedures (including critical care time)  UC Diagnoses / Final Clinical Impressions(s)   I have reviewed the triage vital signs and the nursing notes.  Pertinent labs & imaging results that were available during my care of the patient were reviewed by me and considered in my medical decision making (see chart for details).   Final diagnoses:  Eustachian tube dysfunction, bilateral  Rhinosinusitis  Seasonal allergies   Patient advised to begin Flonase and Zyrtec for her symptoms.  Patient provided with promethazine DM for nighttime cough.  Return precautions advised.  ED Prescriptions     Medication Sig Dispense Auth. Provider   fluticasone  (FLONASE) 50 MCG/ACT nasal spray Place 1 spray into both nostrils daily. Begin by using 2 sprays in each nare daily for 3 to 5 days, then decrease to 1 spray in each nare daily. 16 mL Lynden Oxford Scales, PA-C   cetirizine (ZYRTEC) 5 MG tablet Take 1 tablet (5 mg total) by mouth at bedtime. 30 tablet Lynden Oxford Scales, PA-C   promethazine-dextromethorphan (PROMETHAZINE-DM) 6.25-15 MG/5ML syrup Take 2.5 mLs by mouth 4 (four) times daily as needed for cough. 118 mL Lynden Oxford Scales, PA-C      PDMP not reviewed this encounter.  Pending results:  Labs Reviewed - No data to display  Medications Ordered in UC: Medications - No data to display  Disposition Upon Discharge:  Condition: stable for discharge home Home: take medications as prescribed; routine discharge instructions as discussed; follow up as advised.  Patient presented with an acute illness with associated systemic symptoms and significant discomfort requiring urgent management. In my opinion, this is a condition that a prudent lay person (someone who possesses an average knowledge of health and medicine) may potentially expect to result in complications if not addressed urgently such as respiratory distress, impairment of bodily function or dysfunction of bodily organs.   Routine symptom specific, illness specific and/or disease specific instructions were  discussed with the patient and/or caregiver at length.   As such, the patient has been evaluated and assessed, work-up was performed and treatment was provided in alignment with urgent care protocols and evidence based medicine.  Patient/parent/caregiver has been advised that the patient may require follow up for further testing and treatment if the symptoms continue in spite of treatment, as clinically indicated and appropriate.  If the patient was tested for COVID-19, Influenza and/or RSV, then the patient/parent/guardian was advised to isolate at home pending the  results of his/her diagnostic coronavirus test and potentially longer if they're positive. I have also advised pt that if his/her COVID-19 test returns positive, it's recommended to self-isolate for at least 10 days after symptoms first appeared AND until fever-free for 24 hours without fever reducer AND other symptoms have improved or resolved. Discussed self-isolation recommendations as well as instructions for household member/close contacts as per the [facility] and  DHHS, and also gave patient the [place] packet with this information.  Patient/parent/caregiver has been advised to return to the [facility] or PCP in 3-5 days if no better; to PCP or the Emergency Department if new signs and symptoms develop, or if the current signs or symptoms continue to change or worsen for further workup, evaluation and treatment as clinically indicated and appropriate  The patient will follow up with their current PCP if and as advised. If the patient does not currently have a PCP we will assist them in obtaining one.   The patient may need specialty follow up if the symptoms continue, in spite of conservative treatment and management, for further workup, evaluation, consultation and treatment as clinically indicated and appropriate.  Patient/parent/caregiver verbalized understanding and agreement of plan as discussed.  All questions were addressed during visit.  Please see discharge instructions below for further details of plan.  Discharge Instructions:   Discharge Instructions      Please see the list below for recommended medications, dosages and frequencies to provide relief of your current symptoms:    Zyrtec (cetirizine): This is an excellent second-generation antihistamine that helps to reduce respiratory inflammatory response to environmental allergens.  In some patients, this medication can cause daytime sleepiness so I recommend that you take 1 tablet daily at bedtime.     Flonase (fluticasone): This is a  steroid nasal spray that you use once daily, 1 spray in each nare.  This medication does not work well if you decide to use it only used as you feel you need to, it works best used on a daily basis.  After 3 to 5 days of use, you will notice significant reduction of the inflammation and mucus production that is currently being caused by exposure to allergens, whether seasonal or environmental.  The most common side effect of this medication is nosebleeds.  If you experience a nosebleed, please discontinue use for 1 week, then feel free to resume.  I have provided you with a prescription.     Promethazine DM: Promethazine is both a nasal decongestant and an antinausea medication that makes most patients feel fairly sleepy.  The DM is dextromethorphan, a cough suppressant found in many over-the-counter cough medications.  Please take 2.5 mL before bedtime to minimize your cough which will help you sleep better.  I have provided you with a prescription for this medication.      Please follow-up within the next 5-7 days either with your primary care provider or urgent care if your symptoms do not resolve.  If you  do not have a primary care provider, we will assist you in finding one.   Thank you for visiting urgent care today.  We appreciate the opportunity to participate in your care.       This office note has been dictated using Museum/gallery curator.  Unfortunately, and despite my best efforts, this method of dictation can sometimes lead to occasional typographical or grammatical errors.  I apologize in advance if this occurs.     Lynden Oxford Scales, [place] 07/13/21 [phone]

## 2021-07-15 DIAGNOSIS — H25812 Combined forms of age-related cataract, left eye: Secondary | ICD-10-CM | POA: Diagnosis not present

## 2021-07-15 DIAGNOSIS — H35373 Puckering of macula, bilateral: Secondary | ICD-10-CM | POA: Diagnosis not present

## 2021-07-15 DIAGNOSIS — E113291 Type 2 diabetes mellitus with mild nonproliferative diabetic retinopathy without macular edema, right eye: Secondary | ICD-10-CM | POA: Diagnosis not present

## 2021-07-15 DIAGNOSIS — H3581 Retinal edema: Secondary | ICD-10-CM | POA: Diagnosis not present

## 2021-07-16 DIAGNOSIS — H35373 Puckering of macula, bilateral: Secondary | ICD-10-CM | POA: Diagnosis not present

## 2021-07-16 DIAGNOSIS — E113293 Type 2 diabetes mellitus with mild nonproliferative diabetic retinopathy without macular edema, bilateral: Secondary | ICD-10-CM | POA: Diagnosis not present

## 2021-07-16 DIAGNOSIS — H3509 Other intraretinal microvascular abnormalities: Secondary | ICD-10-CM | POA: Diagnosis not present

## 2021-07-16 LAB — HM DIABETES EYE EXAM

## 2021-08-21 ENCOUNTER — Other Ambulatory Visit: Payer: Self-pay | Admitting: Internal Medicine

## 2021-08-24 ENCOUNTER — Encounter: Payer: Self-pay | Admitting: Internal Medicine

## 2021-08-24 NOTE — Progress Notes (Signed)
Outside notes received. Information abstracted. Notes sent to scan.  

## 2021-09-02 DIAGNOSIS — H3509 Other intraretinal microvascular abnormalities: Secondary | ICD-10-CM | POA: Diagnosis not present

## 2021-09-17 ENCOUNTER — Other Ambulatory Visit: Payer: Self-pay | Admitting: Internal Medicine

## 2021-09-18 ENCOUNTER — Other Ambulatory Visit: Payer: Self-pay | Admitting: Internal Medicine

## 2021-09-19 ENCOUNTER — Other Ambulatory Visit: Payer: Self-pay | Admitting: Internal Medicine

## 2021-09-21 ENCOUNTER — Other Ambulatory Visit: Payer: Self-pay

## 2021-09-21 DIAGNOSIS — E113599 Type 2 diabetes mellitus with proliferative diabetic retinopathy without macular edema, unspecified eye: Secondary | ICD-10-CM

## 2021-09-21 MED ORDER — FREESTYLE LIBRE 2 SENSOR MISC: 6 refills | Status: DC

## 2021-09-22 ENCOUNTER — Ambulatory Visit: Payer: BC Managed Care – PPO | Admitting: Internal Medicine

## 2021-09-30 NOTE — Progress Notes (Signed)
Subjective:    Patient ID: Patricia Mccullough, female    DOB: 1940/09/05, 81 y.o.   MRN: [id number]     HPI Patricia Mccullough is here for follow up of her chronic medical problems, including htn, DM, h/o TIA  Fatigue - not sleeping well - gets up early.  Needs to get to bed earlier- goes to bed too late.    Having some family stresses.    Trying to eat better.    Medications and allergies reviewed with patient and updated if appropriate.  Current Outpatient Medications on File Prior to Visit  Medication Sig Dispense Refill   amLODipine (NORVASC) 5 MG tablet TAKE 1 TABLET BY MOUTH ONCE DAILY . APPOINTMENT REQUIRED FOR FUTURE REFILLS 90 tablet 0   aspirin EC 325 MG tablet Take 1 tablet (325 mg total) by mouth daily. 100 tablet 3   cetirizine (ZYRTEC) 5 MG tablet Take 1 tablet (5 mg total) by mouth at bedtime. 30 tablet 2   Continuous Blood Gluc Sensor (FREESTYLE LIBRE 2 SENSOR) MISC Use daily to check blood sugars levels 4x a day (14 day sensor) 2 each 6   Continuous Blood Gluc Sensor (FREESTYLE LIBRE SENSOR SYSTEM) MISC Use as directed to check sugars.  Dx  E11.9 with hyperglycemia 1 each 0   fluticasone (FLONASE) 50 MCG/ACT nasal spray Place 1 spray into both nostrils daily. Begin by using 2 sprays in each nare daily for 3 to 5 days, then decrease to 1 spray in each nare daily. 16 mL 1   GARLIC-CALCIUM PO Take 1 tablet by mouth daily.     metFORMIN (GLUCOPHAGE) 1000 MG tablet TAKE 1 TABLET BY MOUTH TWICE DAILY WITH MEALS 180 tablet 0   Propylene Glycol (SYSTANE BALANCE OP) Place 1 drop into both eyes QID. Four times a day     SYSTANE BALANCE 0.6 % SOLN SMARTSIG:1 Drop(s) In Eye(s) PRN     JARDIANCE 10 MG TABS tablet Take 10 mg by mouth every morning. (Patient not taking: Reported on 10/06/2021)     No current facility-administered medications on file prior to visit.     Review of Systems  Constitutional:  Positive for appetite change (decreased). Negative for chills and fever.   HENT:  Positive for postnasal drip (sometimes), sneezing and voice change.   Respiratory:  Negative for cough, shortness of breath and wheezing.   Cardiovascular:  Negative for chest pain, palpitations and leg swelling.  Gastrointestinal:        No gerd  Neurological:  Positive for light-headedness (one episode). Negative for headaches.       Objective:   Vitals:   10/06/21 1039  BP: 122/72  Pulse: 80  Temp: 97.9 F (36.6 C)  SpO2: 96%   BP Readings from Last 3 Encounters:  10/06/21 122/72  07/12/21 131/75  03/24/21 128/78   Wt Readings from Last 3 Encounters:  10/06/21 145 lb 9.6 oz (66 kg)  03/24/21 147 lb 9.6 oz (67 kg)  09/12/20 154 lb (69.9 kg)   Body mass index is 25.79 kg/m.    Physical Exam Constitutional:      General: She is not in acute distress.    Appearance: Normal appearance.  HENT:     Head: Normocephalic and atraumatic.  Eyes:     Conjunctiva/sclera: Conjunctivae normal.  Cardiovascular:     Rate and Rhythm: Normal rate and regular rhythm.     Heart sounds: Normal heart sounds. No murmur heard. Pulmonary:  Effort: Pulmonary effort is normal. No respiratory distress.     Breath sounds: Normal breath sounds. No wheezing.  Musculoskeletal:     Cervical back: Neck supple.     Right lower leg: No edema.     Left lower leg: No edema.  Lymphadenopathy:     Cervical: No cervical adenopathy.  Skin:    General: Skin is warm and dry.     Findings: No rash.  Neurological:     Mental Status: She is alert. Mental status is at baseline.  Psychiatric:        Mood and Affect: Mood normal.        Behavior: Behavior normal.        Lab Results  Component Value Date   WBC 6.4 09/12/2020   HGB 12.6 09/12/2020   HCT 38.5 09/12/2020   PLT 218.0 09/12/2020   GLUCOSE 131 (H) 03/24/2021   CHOL 162 03/24/2021   TRIG 126.0 03/24/2021   HDL 55.90 03/24/2021   LDLDIRECT 44.0 02/23/2019   LDLCALC 80 03/24/2021   ALT 13 03/24/2021   AST 14 03/24/2021    NA 140 03/24/2021   K 4.6 03/24/2021   CL 106 03/24/2021   CREATININE 0.87 03/24/2021   BUN 13 03/24/2021   CO2 31 03/24/2021   TSH 1.78 09/12/2020   HGBA1C 8.3 (H) 03/24/2021   MICROALBUR 7.6 (H) 09/12/2020     Assessment & Plan:    See Problem List for Assessment and Plan of chronic medical problems.

## 2021-09-30 NOTE — Patient Instructions (Addendum)
     Blood work was ordered.      Medications changes include :   restart jardiance 10 mg daily     Return in about 6 months (around 04/08/2022) for Physical Exam.

## 2021-10-01 DIAGNOSIS — Z961 Presence of intraocular lens: Secondary | ICD-10-CM | POA: Diagnosis not present

## 2021-10-02 LAB — HM DIABETES EYE EXAM

## 2021-10-06 ENCOUNTER — Ambulatory Visit (INDEPENDENT_AMBULATORY_CARE_PROVIDER_SITE_OTHER): Payer: Medicare Other | Admitting: Internal Medicine

## 2021-10-06 ENCOUNTER — Encounter: Payer: Self-pay | Admitting: Internal Medicine

## 2021-10-06 VITALS — BP 122/72 | HR 80 | Temp 97.9°F | Ht 63.0 in | Wt 145.6 lb

## 2021-10-06 DIAGNOSIS — I10 Essential (primary) hypertension: Secondary | ICD-10-CM | POA: Diagnosis not present

## 2021-10-06 DIAGNOSIS — E113599 Type 2 diabetes mellitus with proliferative diabetic retinopathy without macular edema, unspecified eye: Secondary | ICD-10-CM | POA: Diagnosis not present

## 2021-10-06 DIAGNOSIS — Z8673 Personal history of transient ischemic attack (TIA), and cerebral infarction without residual deficits: Secondary | ICD-10-CM | POA: Diagnosis not present

## 2021-10-06 LAB — COMPREHENSIVE METABOLIC PANEL
ALT: 12 U/L (ref 0–35)
AST: 13 U/L (ref 0–37)
Albumin: 4.1 g/dL (ref 3.5–5.2)
Alkaline Phosphatase: 65 U/L (ref 39–117)
BUN: 30 mg/dL — ABNORMAL HIGH (ref 6–23)
CO2: 27 mEq/L (ref 19–32)
Calcium: 9.7 mg/dL (ref 8.4–10.5)
Chloride: 103 mEq/L (ref 96–112)
Creatinine, Ser: 1.02 mg/dL (ref 0.40–1.20)
GFR: 52.12 mL/min — ABNORMAL LOW (ref >60.00)
Glucose, Bld: 122 mg/dL — ABNORMAL HIGH (ref 70–99)
Potassium: 4.2 mEq/L (ref 3.5–5.1)
Sodium: 137 mEq/L (ref 135–145)
Total Bilirubin: 0.4 mg/dL (ref 0.2–1.2)
Total Protein: 7.3 g/dL (ref 6.0–8.3)

## 2021-10-06 LAB — CBC WITH DIFFERENTIAL/PLATELET
Basophils Absolute: 0.1 10*3/uL (ref 0.0–0.1)
Basophils Relative: 1 % (ref 0.0–3.0)
Eosinophils Absolute: 0.4 10*3/uL (ref 0.0–0.7)
Eosinophils Relative: 5.6 % — ABNORMAL HIGH (ref 0.0–5.0)
HCT: 37.9 % (ref 36.0–46.0)
Hemoglobin: 12.6 g/dL (ref 12.0–15.0)
Lymphocytes Relative: 40.8 % (ref 12.0–46.0)
Lymphs Abs: 3 10*3/uL (ref 0.7–4.0)
MCHC: 33.4 g/dL (ref 30.0–36.0)
MCV: 93.1 fl (ref 78.0–100.0)
Monocytes Absolute: 0.5 10*3/uL (ref 0.1–1.0)
Monocytes Relative: 7.4 % (ref 3.0–12.0)
Neutro Abs: 3.3 10*3/uL (ref 1.4–7.7)
Neutrophils Relative %: 45.2 % (ref 43.0–77.0)
Platelets: 219 10*3/uL (ref 150.0–400.0)
RBC: 4.07 Mil/uL (ref 3.87–5.11)
RDW: 15.8 % — ABNORMAL HIGH (ref 11.5–15.5)
WBC: 7.2 10*3/uL (ref 4.0–10.5)

## 2021-10-06 LAB — LIPID PANEL
Cholesterol: 172 mg/dL (ref 0–200)
HDL: 63.1 mg/dL (ref >39.00)
NonHDL: 109.17
Total CHOL/HDL Ratio: 3
Triglycerides: 243 mg/dL — ABNORMAL HIGH (ref 0.0–149.0)
VLDL: 48.6 mg/dL — ABNORMAL HIGH (ref 0.0–40.0)

## 2021-10-06 LAB — HEMOGLOBIN A1C: Hgb A1c MFr Bld: 8.8 % — ABNORMAL HIGH (ref 4.6–6.5)

## 2021-10-06 LAB — LDL CHOLESTEROL, DIRECT: Direct LDL: 43 mg/dL

## 2021-10-06 MED ORDER — CETIRIZINE HCL 5 MG PO TABS: 5.0000 mg | ORAL_TABLET | Freq: Every evening | ORAL | 2 refills | Status: DC | PRN

## 2021-10-06 MED ORDER — JARDIANCE 10 MG PO TABS
10.0000 mg | ORAL_TABLET | Freq: Every morning | ORAL | 5 refills | Status: DC
Start: 2021-10-06 — End: 2022-04-12

## 2021-10-06 NOTE — Assessment & Plan Note (Addendum)
Chronic History of TIA Discussed decreasing aspirin 325 mg daily Blood pressure well controlled Stressed good sugar control Advised starting a statin

## 2021-10-06 NOTE — Assessment & Plan Note (Addendum)
Chronic Last A1c 8.3%-not ideally controlled Continue metformin 1000 mg twice daily Restart jardiance 10 mg daily - did ok with it Check A1c, urine microalbumin

## 2021-10-06 NOTE — Assessment & Plan Note (Signed)
Chronic °Blood pressure well controlled °CMP °Continue amlodipine 5 mg daily °

## 2021-10-07 LAB — MICROALBUMIN / CREATININE URINE RATIO
Creatinine,U: 65.4 mg/dL
Microalb Creat Ratio: 11.4 mg/g (ref 0.0–30.0)
Microalb, Ur: 7.5 mg/dL — ABNORMAL HIGH (ref 0.0–1.9)

## 2021-10-08 ENCOUNTER — Encounter: Payer: Self-pay | Admitting: Internal Medicine

## 2021-10-08 NOTE — Progress Notes (Signed)
Outside notes received. Information abstracted. Notes sent to scan.  

## 2021-10-14 ENCOUNTER — Other Ambulatory Visit (HOSPITAL_COMMUNITY)
Admission: RE | Admit: 2021-10-14 | Discharge: 2021-10-14 | Disposition: A | Discharge status: Home / Self Care | Payer: Medicare Other | Source: Ambulatory Visit | Attending: Obstetrics and Gynecology | Admitting: Obstetrics and Gynecology

## 2021-10-14 ENCOUNTER — Telehealth: Payer: Self-pay | Admitting: *Deleted

## 2021-10-14 ENCOUNTER — Ambulatory Visit (INDEPENDENT_AMBULATORY_CARE_PROVIDER_SITE_OTHER): Payer: Medicare Other | Admitting: Radiology

## 2021-10-14 ENCOUNTER — Encounter: Payer: Self-pay | Admitting: Radiology

## 2021-10-14 VITALS — BP 124/70 | Ht 62.0 in | Wt 146.0 lb

## 2021-10-14 DIAGNOSIS — Z1151 Encounter for screening for human papillomavirus (HPV): Secondary | ICD-10-CM | POA: Diagnosis not present

## 2021-10-14 DIAGNOSIS — Z01419 Encounter for gynecological examination (general) (routine) without abnormal findings: Secondary | ICD-10-CM | POA: Diagnosis present

## 2021-10-14 DIAGNOSIS — Z9189 Other specified personal risk factors, not elsewhere classified: Secondary | ICD-10-CM | POA: Diagnosis not present

## 2021-10-14 DIAGNOSIS — Z124 Encounter for screening for malignant neoplasm of cervix: Secondary | ICD-10-CM

## 2021-10-14 DIAGNOSIS — N814 Uterovaginal prolapse, unspecified: Secondary | ICD-10-CM

## 2021-10-14 NOTE — Telephone Encounter (Signed)
Referral placed they will call to schedule.

## 2021-10-14 NOTE — Telephone Encounter (Signed)
-----   Message from Kerry Dory, NP sent at 10/14/2021 11:53 AM EDT ----- Regarding: Referral Please refer to urogyn for stage 4 pelvic organ prolapse.

## 2021-10-14 NOTE — Progress Notes (Signed)
   Patricia Mccullough [birth year] [id number]   History:  81 y.o. G2P2 presents with c/o sudden worsening pelvic organ prolapse, last evaluated 2021 and was diagnosed with stage 1-2 prolapse. Patient reports in the past week she has felt worsening pressure and the uterus how now fallen out of the vagina about 6 inches. No bleeding or urinary symptoms.  Gynecologic History No LMP recorded. Patient is postmenopausal.    Obstetric History OB History  Gravida Para Term Preterm AB Living  '2 2 2     2  '$ SAB IAB Ectopic Multiple Live Births               # Outcome Date GA Lbr Len/2nd Weight Sex Delivery Anes PTL Lv  2 Term           1 Term              The following portions of the patient's history were reviewed and updated as appropriate: allergies, current medications, past family history, past medical history, past social history, past surgical history, and problem list.  Review of Systems Pertinent items noted in HPI and remainder of comprehensive ROS otherwise negative.   Past medical history, past surgical history, family history and social history were all reviewed and documented in the EPIC chart.   Exam:  Vitals:   10/14/21 1117  BP: 124/70  Weight: 146 lb (66.2 kg)  Height: '5\' 2"'$  (1.575 m)   Body mass index is 26.7 kg/m.  General appearance:  Normal Abdominal  Soft,nontender, without masses, guarding or rebound.  Liver/spleen:  No organomegaly noted  Hernia:  None appreciated Genitourinary   Inguinal/mons:  Normal without inguinal adenopathy  External genitalia:  Normal appearing vulva with no masses, tenderness, or lesions  BUS/Urethra/Skene's glands:  Normal without masses or exudate  Vagina:  uterine prolapse, stage 4, rectocele stage 2  Cervix:  endocervix apparent on examination from pressure of prolapse, pap obtained  Uterus:  Normal in size, shape and contour.  Mobile, nontender  Adnexa/parametria:  atrophic, non palpable  Anus and  perineum: Normal Fitted with #6 ring pessary with support after using gentle pressure to push the uterus back into position. She was encouraged to ambulate and try to void with pessary, #6 fell out. The same happened with #7 but it was not as easy to come out. A #8 ring with support was ordered.  Assessment/Plan:   1. Well woman exam with routine gynecological exam - Cytology - PAP( [place])  2. Cystocele with uterine prolapse Pessary fitted, will order #8 with support. Will call and schedule for insertion when we receive it.     Rubbie Battiest B WHNP-BC 11:49 AM 10/14/2021

## 2021-10-14 NOTE — Telephone Encounter (Signed)
Pessary #8 ring with support ordered.

## 2021-10-16 ENCOUNTER — Encounter: Payer: Self-pay | Admitting: *Deleted

## 2021-10-20 ENCOUNTER — Ambulatory Visit: Payer: Medicare Other | Admitting: Obstetrics and Gynecology

## 2021-10-20 LAB — CYTOLOGY - PAP
Comment: NEGATIVE
Diagnosis: NEGATIVE
High risk HPV: NEGATIVE

## 2021-10-21 NOTE — Telephone Encounter (Signed)
Call to patient. Left message stating pessary in office, please call to schedule appt for pessary placement with Jami, NP  at [phone], OPT 1 for appts.   Encounter closed.

## 2021-10-23 ENCOUNTER — Ambulatory Visit: Payer: Medicare Other | Admitting: Radiology

## 2021-10-23 ENCOUNTER — Encounter: Payer: Self-pay | Admitting: Radiology

## 2021-10-23 VITALS — BP 140/68

## 2021-10-23 DIAGNOSIS — N814 Uterovaginal prolapse, unspecified: Secondary | ICD-10-CM

## 2021-10-23 NOTE — Progress Notes (Signed)
   Patricia Mccullough 1940/09/08 [id number]   History:  81 y.o. G2P2 presents with pelvic organ prolapse, here for pessary placement. Ring #8 with support.  Gynecologic History No LMP recorded. Patient is postmenopausal.    Obstetric History OB History  Gravida Para Term Preterm AB Living  '2 2 2     2  '$ SAB IAB Ectopic Multiple Live Births               # Outcome Date GA Lbr Len/2nd Weight Sex Delivery Anes PTL Lv  2 Term           1 Term              The following portions of the patient's history were reviewed and updated as appropriate: allergies, current medications, past family history, past medical history, past social history, past surgical history, and problem list.  Review of Systems Pertinent items noted in HPI and remainder of comprehensive ROS otherwise negative.   Past medical history, past surgical history, family history and social history were all reviewed and documented in the EPIC chart.   Exam:  Vitals:   10/23/21 0952  BP: (!) 140/68   There is no height or weight on file to calculate BMI.  General appearance:  Normal Abdominal  Soft,nontender, without masses, guarding or rebound.  Liver/spleen:  No organomegaly noted  Hernia:  None appreciated Genitourinary   Inguinal/mons:  Normal without inguinal adenopathy  External genitalia:  Normal appearing vulva with no masses, tenderness, or lesions  BUS/Urethra/Skene's glands:  Normal without masses or exudate  Vagina:  uterine prolapse, stage 4, rectocele stage 2  Cervix:  endocervix apparent on examination from pressure of prolapse, pap obtained  Uterus:  Normal in size, shape and contour.  Mobile, nontender  Adnexa/parametria:  atrophic, non palpable  Anus and perineum: Normal Ring #8 with support placed without difficulty. Discussed removal, reinsertion and cleaning.  Assessment/Plan:   1. Uterine prolapse 2. Cystocele with uterine prolapse Ring with support #8 in place Urogyn referral  pending     Rubbie Battiest B WHNP-BC 10:08 AM 10/23/2021

## 2021-10-28 ENCOUNTER — Telehealth: Payer: Self-pay | Admitting: *Deleted

## 2021-10-28 NOTE — Telephone Encounter (Signed)
Patient was seen on 11/02/21 for pessary placement. Called in triage voicemail c/o discomfort when standing. I left message for patient to call.

## 2021-11-05 NOTE — Telephone Encounter (Signed)
Can we get her in to see Dr Zigmund Daniel at [facility] sooner?

## 2021-11-05 NOTE — Telephone Encounter (Signed)
Patient scheduled on 03/26/22

## 2021-11-06 NOTE — Telephone Encounter (Signed)
Thanks for checking 

## 2021-11-06 NOTE — Telephone Encounter (Signed)
I called and Dr.Catherine Zigmund Daniel office is booking out in 2/24 as well.

## 2021-11-12 ENCOUNTER — Telehealth: Payer: Self-pay

## 2021-11-12 NOTE — Telephone Encounter (Signed)
Spoke with patient and informed her. She wanted to go ahead and schedule visit with Wende Crease, NP next week. Appt scheduled for 11/18/21 at 11:30am instructed her to check in by 11:15am.

## 2021-11-12 NOTE — Telephone Encounter (Signed)
Patient was in 10/23/21 and Wende Crease, NP placed pessary. She said in the first days she had some pressure/discomfort with standing but each day it got better and some days she does not feel it at all.  She c/o's vaginal odor and a cream colored vaginal discharge.  She is scheduled with Urogyn for 03/26/22.  I explained Jami, NP is out of the office for a few days and I will check with her associate Marisa Sprinkles., NP and call her back.

## 2021-11-12 NOTE — Telephone Encounter (Signed)
Glad the device has become more comfortable. It is not uncommon to have increased discharge after placement as the body responds to device. I would recommend monitoring and if still present after this weekend she should return to office to rule out infection.

## 2021-11-15 HISTORY — PX: MM BREAST STEREO BIOPSY LEFT (ARMC HX): HXRAD1824

## 2021-11-18 ENCOUNTER — Ambulatory Visit (INDEPENDENT_AMBULATORY_CARE_PROVIDER_SITE_OTHER): Payer: Medicare Other | Admitting: Radiology

## 2021-11-18 VITALS — BP 132/70

## 2021-11-18 DIAGNOSIS — N952 Postmenopausal atrophic vaginitis: Secondary | ICD-10-CM

## 2021-11-18 DIAGNOSIS — N760 Acute vaginitis: Secondary | ICD-10-CM | POA: Diagnosis not present

## 2021-11-18 LAB — WET PREP FOR TRICH, YEAST, CLUE

## 2021-11-18 MED ORDER — ESTRADIOL 0.1 MG/GM VA CREA: 1.0000 g | TOPICAL_CREAM | VAGINAL | 12 refills | Status: DC

## 2021-11-18 NOTE — Progress Notes (Signed)
      Subjective: Patricia Mccullough is a 81 y.o. female who complains of an increase in vaginal discharge without odor or itching since pessary placement 4 weeks ago. Denies any urinary complaints. Pessary has remained in place since placement.   Review of Systems  All other systems reviewed and are negative.   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   -General: no acute distress -Vulva: without lesions or discharge -Vagina: scant bleeding noted on vaginal walls, no excoriation apparent. very minimal white d/c. Pessary remained in place at patient request   Microscopic wet-mount exam shows negative for pathogens, normal epithelial cells.   Leatrice Jewels, CMA present for exam  Assessment:/Plan:   1. Acute vaginitis Reassured neg wet mount - WET PREP FOR TRICH, YEAST, CLUE  2. Atrophic vaginitis Begin using 3times weekly at night to heal any slight excoriation Discussed the adjustment period when using a pessary Otherwise patient is very happy with pessary use at this time Has UROGYN appt 2/24 - estradiol (ESTRACE VAGINAL) 0.1 MG/GM vaginal cream; Place 1 g vaginally 3 (three) times a week. At bedtime  Dispense: 42.5 g; Refill: 12

## 2021-11-23 ENCOUNTER — Other Ambulatory Visit: Payer: Self-pay | Admitting: Internal Medicine

## 2021-11-26 ENCOUNTER — Observation Stay (HOSPITAL_COMMUNITY)
Admission: EM | Admit: 2021-11-26 | Discharge: 2021-11-27 | Disposition: A | Discharge status: Home / Self Care | Payer: Medicare Other | Attending: Internal Medicine | Admitting: Internal Medicine

## 2021-11-26 ENCOUNTER — Other Ambulatory Visit: Payer: Self-pay

## 2021-11-26 ENCOUNTER — Encounter (HOSPITAL_COMMUNITY): Payer: Self-pay

## 2021-11-26 ENCOUNTER — Emergency Department (HOSPITAL_COMMUNITY): Payer: Medicare Other

## 2021-11-26 ENCOUNTER — Ambulatory Visit
Admission: EM | Admit: 2021-11-26 | Discharge: 2021-11-26 | Disposition: A | Discharge status: Other Acute Inpatient Hospital | Payer: Medicare Other

## 2021-11-26 DIAGNOSIS — R29700 NIHSS score 0: Secondary | ICD-10-CM | POA: Diagnosis not present

## 2021-11-26 DIAGNOSIS — Z79899 Other long term (current) drug therapy: Secondary | ICD-10-CM | POA: Insufficient documentation

## 2021-11-26 DIAGNOSIS — G4489 Other headache syndrome: Secondary | ICD-10-CM | POA: Diagnosis not present

## 2021-11-26 DIAGNOSIS — R6889 Other general symptoms and signs: Secondary | ICD-10-CM | POA: Diagnosis not present

## 2021-11-26 DIAGNOSIS — R220 Localized swelling, mass and lump, head: Secondary | ICD-10-CM | POA: Diagnosis not present

## 2021-11-26 DIAGNOSIS — Z7982 Long term (current) use of aspirin: Secondary | ICD-10-CM | POA: Insufficient documentation

## 2021-11-26 DIAGNOSIS — Z7984 Long term (current) use of oral hypoglycemic drugs: Secondary | ICD-10-CM | POA: Diagnosis not present

## 2021-11-26 DIAGNOSIS — E113599 Type 2 diabetes mellitus with proliferative diabetic retinopathy without macular edema, unspecified eye: Secondary | ICD-10-CM

## 2021-11-26 DIAGNOSIS — R0989 Other specified symptoms and signs involving the circulatory and respiratory systems: Secondary | ICD-10-CM | POA: Diagnosis not present

## 2021-11-26 DIAGNOSIS — E1169 Type 2 diabetes mellitus with other specified complication: Secondary | ICD-10-CM

## 2021-11-26 DIAGNOSIS — R4781 Slurred speech: Secondary | ICD-10-CM | POA: Diagnosis not present

## 2021-11-26 DIAGNOSIS — G459 Transient cerebral ischemic attack, unspecified: Secondary | ICD-10-CM | POA: Diagnosis not present

## 2021-11-26 DIAGNOSIS — R29818 Other symptoms and signs involving the nervous system: Secondary | ICD-10-CM

## 2021-11-26 DIAGNOSIS — Z743 Need for continuous supervision: Secondary | ICD-10-CM | POA: Diagnosis not present

## 2021-11-26 DIAGNOSIS — R479 Unspecified speech disturbances: Secondary | ICD-10-CM | POA: Diagnosis not present

## 2021-11-26 DIAGNOSIS — I639 Cerebral infarction, unspecified: Secondary | ICD-10-CM | POA: Insufficient documentation

## 2021-11-26 DIAGNOSIS — Z7951 Long term (current) use of inhaled steroids: Secondary | ICD-10-CM | POA: Insufficient documentation

## 2021-11-26 DIAGNOSIS — E119 Type 2 diabetes mellitus without complications: Secondary | ICD-10-CM | POA: Diagnosis not present

## 2021-11-26 DIAGNOSIS — E1159 Type 2 diabetes mellitus with other circulatory complications: Secondary | ICD-10-CM | POA: Diagnosis present

## 2021-11-26 DIAGNOSIS — R471 Dysarthria and anarthria: Secondary | ICD-10-CM

## 2021-11-26 DIAGNOSIS — I10 Essential (primary) hypertension: Secondary | ICD-10-CM | POA: Diagnosis not present

## 2021-11-26 DIAGNOSIS — R519 Headache, unspecified: Secondary | ICD-10-CM | POA: Diagnosis present

## 2021-11-26 DIAGNOSIS — R011 Cardiac murmur, unspecified: Secondary | ICD-10-CM | POA: Diagnosis not present

## 2021-11-26 DIAGNOSIS — R262 Difficulty in walking, not elsewhere classified: Secondary | ICD-10-CM | POA: Diagnosis not present

## 2021-11-26 DIAGNOSIS — E785 Hyperlipidemia, unspecified: Secondary | ICD-10-CM

## 2021-11-26 LAB — CBC WITH DIFFERENTIAL/PLATELET
Abs Immature Granulocytes: 0.03 10*3/uL (ref 0.00–0.07)
Basophils Absolute: 0.1 10*3/uL (ref 0.0–0.1)
Basophils Relative: 1 %
Eosinophils Absolute: 0.4 10*3/uL (ref 0.0–0.5)
Eosinophils Relative: 4 %
HCT: 40.3 % (ref 36.0–46.0)
Hemoglobin: 13.3 g/dL (ref 12.0–15.0)
Immature Granulocytes: 0 %
Lymphocytes Relative: 25 %
Lymphs Abs: 2.5 10*3/uL (ref 0.7–4.0)
MCH: 30.9 pg (ref 26.0–34.0)
MCHC: 33 g/dL (ref 30.0–36.0)
MCV: 93.7 fL (ref 80.0–100.0)
Monocytes Absolute: 0.6 10*3/uL (ref 0.1–1.0)
Monocytes Relative: 6 %
Neutro Abs: 6.3 10*3/uL (ref 1.7–7.7)
Neutrophils Relative %: 64 %
Platelets: 248 10*3/uL (ref 150–400)
RBC: 4.3 MIL/uL (ref 3.87–5.11)
RDW: 14.9 % (ref 11.5–15.5)
WBC: 10 10*3/uL (ref 4.0–10.5)
nRBC: 0 % (ref 0.0–0.2)

## 2021-11-26 LAB — I-STAT CHEM 8, ED
BUN: 17 mg/dL (ref 8–23)
Calcium, Ion: 1.23 mmol/L (ref 1.15–1.40)
Chloride: 105 mmol/L (ref 98–111)
Creatinine, Ser: 0.8 mg/dL (ref 0.44–1.00)
Glucose, Bld: 100 mg/dL — ABNORMAL HIGH (ref 70–99)
HCT: 39 % (ref 36.0–46.0)
Hemoglobin: 13.3 g/dL (ref 12.0–15.0)
Potassium: 4.2 mmol/L (ref 3.5–5.1)
Sodium: 141 mmol/L (ref 135–145)
TCO2: 26 mmol/L (ref 22–32)

## 2021-11-26 LAB — CBG MONITORING, ED: Glucose-Capillary: 108 mg/dL — ABNORMAL HIGH (ref 70–99)

## 2021-11-26 MED ORDER — IOHEXOL 350 MG/ML SOLN
75.0000 mL | Freq: Once | INTRAVENOUS | Status: AC | PRN
  Administered 2021-11-26: 75 mL via INTRAVENOUS

## 2021-11-26 NOTE — ED Notes (Signed)
Pt here for HA and slurred/difficult speech starting yesterday; pt elects to been seen in ED due to limitations of testing at [facility] and EMS was called per request

## 2021-11-26 NOTE — ED Notes (Signed)
ED Provider at bedside. 

## 2021-11-26 NOTE — ED Provider Notes (Signed)
[facility] DEPT Provider Note   CSN: [id number] Arrival date & time: 11/26/21  1821     History  Chief Complaint  Patient presents with   Headache    Patricia Mccullough is a 81 y.o. female, hx of DMII and HTN, who presents to the ED 2/2 to difficulty finding words for the last day since 6 PM.  She states that words are not coming out the way she wants them to.  Also states she has had a headache since 9 AM this morning, but this has resolved.  States it is mostly frontal, no vision changes.  Denies any weakness on one side of her body, difficulty swallowing, facial droop, changes in vision, nausea, vomiting.  Denies any recent head trauma.  No on any oral anti-coagulants. Has not taken anything for headache.      Home Medications Prior to Admission medications   Medication Sig Start Date End Date Taking? Authorizing Provider  amLODipine (NORVASC) 5 MG tablet TAKE 1 TABLET BY MOUTH ONCE DAILY . APPOINTMENT REQUIRED FOR FUTURE REFILLS 08/21/21  Yes Binnie Rail, MD  aspirin EC 325 MG tablet Take 1 tablet (325 mg total) by mouth daily. 05/08/13  Yes Rowe Clack, MD  cetirizine (ZYRTEC) 5 MG tablet Take 1 tablet (5 mg total) by mouth at bedtime as needed for allergies. 10/06/21 01/04/22 Yes Burns, Claudina Lick, MD  estradiol (ESTRACE VAGINAL) 0.1 MG/GM vaginal cream Place 1 g vaginally 3 (three) times a week. At bedtime 11/18/21  Yes Chrzanowski, Jami B, NP  GARLIC-CALCIUM PO Take 1 tablet by mouth daily.   Yes [provider]  JARDIANCE 10 MG TABS tablet Take 1 tablet (10 mg total) by mouth every morning. 10/06/21  Yes Burns, Claudina Lick, MD  metFORMIN (GLUCOPHAGE) 1000 MG tablet TAKE 1 TABLET BY MOUTH TWICE DAILY WITH MEALS 11/23/21  Yes Burns, Claudina Lick, MD  SYSTANE BALANCE 0.6 % SOLN Place 1 drop into both eyes in the morning, at noon, in the evening, and at bedtime. 07/15/21  Yes [provider]  Continuous Blood Gluc Sensor (FREESTYLE LIBRE  2 SENSOR) MISC Use daily to check blood sugars levels 4x a day (14 day sensor) 09/21/21   Burns, Claudina Lick, MD  Continuous Blood Gluc Sensor ([place]) MISC Use as directed to check sugars.  Dx  E11.9 with hyperglycemia 07/20/18   Burns, Claudina Lick, MD  fluticasone (FLONASE) 50 MCG/ACT nasal spray Place 1 spray into both nostrils daily. Begin by using 2 sprays in each nare daily for 3 to 5 days, then decrease to 1 spray in each nare daily. Patient not taking: Reported on 10/23/2021 07/12/21   Lynden Oxford Scales, PA-C      Allergies    Patient has no known allergies.    Review of Systems   Review of Systems  Neurological:  Positive for speech difficulty and headaches. Negative for dizziness, syncope, light-headedness and numbness.    Physical Exam Updated Vital Signs BP (!) 140/75   Pulse 80   Temp 98.3 F (36.8 C) (Oral)   Resp 16   SpO2 100%  Physical Exam Vitals and nursing note reviewed.  Constitutional:      General: She is not in acute distress.    Appearance: She is well-developed.  HENT:     Head: Normocephalic and atraumatic.  Eyes:     General: No visual field deficit.    Conjunctiva/sclera: Conjunctivae normal.  Cardiovascular:  Rate and Rhythm: Normal rate and regular rhythm.     Heart sounds: No murmur heard. Pulmonary:     Effort: Pulmonary effort is normal. No respiratory distress.     Breath sounds: Normal breath sounds.  Abdominal:     Palpations: Abdomen is soft.     Tenderness: There is no abdominal tenderness.  Musculoskeletal:        General: No swelling.     Cervical back: Neck supple.  Skin:    General: Skin is warm and dry.     Capillary Refill: Capillary refill takes less than 2 seconds.  Neurological:     Mental Status: She is alert.     Cranial Nerves: Dysarthria present. No cranial nerve deficit or facial asymmetry.     Sensory: No sensory deficit.     Motor: No weakness.     Coordination: Coordination normal.   Psychiatric:        Mood and Affect: Mood normal.     ED Results / Procedures / Treatments   Labs (all labs ordered are listed, but only abnormal results are displayed) Labs Reviewed  CBG MONITORING, ED - Abnormal; Notable for the following components:      Result Value   Glucose-Capillary 108 (*)    All other components within normal limits  I-STAT CHEM 8, ED - Abnormal; Notable for the following components:   Glucose, Bld 100 (*)    All other components within normal limits  CBC WITH DIFFERENTIAL/PLATELET  BASIC METABOLIC PANEL    EKG None  Radiology CT ANGIO HEAD NECK W WO CM  Result Date: 11/26/2021 CLINICAL DATA:  Word-finding difficulty EXAM: CT ANGIOGRAPHY HEAD AND NECK TECHNIQUE: Multidetector CT imaging of the head and neck was performed using the standard protocol during bolus administration of intravenous contrast. Multiplanar CT image reconstructions and MIPs were obtained to evaluate the vascular anatomy. Carotid stenosis measurements (when applicable) are obtained utilizing NASCET criteria, using the distal internal carotid diameter as the denominator. RADIATION DOSE REDUCTION: This exam was performed according to the departmental dose-optimization program which includes automated exposure control, adjustment of the mA and/or kV according to patient size and/or use of iterative reconstruction technique. CONTRAST:  33m OMNIPAQUE IOHEXOL 350 MG/ML SOLN COMPARISON:  08/24/2020 FINDINGS: CT HEAD FINDINGS Brain: There is no mass, hemorrhage or extra-axial collection. Unchanged enlarged extra-axial spaces. There is hypoattenuation of the periventricular white matter, most commonly indicating chronic ischemic microangiopathy. Skull: The visualized skull base, calvarium and extracranial soft tissues are normal. Sinuses/Orbits: No fluid levels or advanced mucosal thickening of the visualized paranasal sinuses. No mastoid or middle ear effusion. The orbits are normal. CTA NECK  FINDINGS SKELETON: There is no bony spinal canal stenosis. No lytic or blastic lesion. 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 UPPER CHEST: No pneumothorax or pleural effusion. No nodules or masses. AORTIC ARCH: There is calcific atherosclerosis of the aortic arch. There is no aneurysm, dissection or hemodynamically significant stenosis of the visualized portion of the aorta. Conventional 3 vessel aortic branching pattern. The visualized proximal subclavian arteries are widely patent. RIGHT CAROTID SYSTEM: Normal without aneurysm, dissection or stenosis. LEFT CAROTID SYSTEM: Normal without aneurysm, dissection or stenosis. VERTEBRAL ARTERIES: Left dominant configuration. Both origins are clearly patent. There is no dissection, occlusion or flow-limiting stenosis to the skull base (V1-V3 segments). CTA HEAD FINDINGS POSTERIOR CIRCULATION: --Vertebral arteries: Normal V4 segments. --Inferior cerebellar arteries: Normal. --Basilar artery: Normal. --Superior cerebellar arteries: Normal. --Posterior  cerebral arteries (PCA): Normal. ANTERIOR CIRCULATION: --Intracranial internal carotid arteries: Normal. --Anterior cerebral arteries (ACA): Normal. Both A1 segments are present. Patent anterior communicating artery (a-comm). --Middle cerebral arteries (MCA): Normal. VENOUS SINUSES: As permitted by contrast timing, patent. ANATOMIC VARIANTS: None Review of the MIP images confirms the above findings. IMPRESSION: 1. No emergent large vessel occlusion or hemodynamically significant stenosis of the head or neck. 2. 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 Electronically Signed   By: Ulyses Jarred M.D.   On: 11/26/2021 23:41    Procedures Procedures   Medications Ordered in ED Medications  iohexol (OMNIPAQUE) 350 MG/ML injection 75 mL (75 mLs Intravenous Contrast Given  11/26/21 2254)    ED Course/ Medical Decision Making/ A&P                           Medical Decision Making Amount and/or Complexity of Data Reviewed Labs: ordered. Radiology: ordered.  Risk Prescription drug management.   Patient is an 81 year old female, history hypertension, diabetes, who presents to the ED secondary to headache, difficulty falling and finding words for the last day and a half.  On exam there are no neurodeficits, she does have difficulty finding words, however during her time in the ER this resolved.  This was staffed with Dr. Osborne Casco.  Neurology was consulted, and they plan on doing a bedside bedside eval via telemetry health.  Admission versus discharge pending at this time per neurology recommendations.  CT head neck unremarkable.  NIH score low.   Transferred care to [facility].  Final Clinical Impression(s) / ED Diagnoses Final diagnoses:  None    Rx / DC Orders ED Discharge Orders     None         Roselin Wiemann, Si Gaul, PA 11/27/21 0025    Audley Hose, MD 11/27/21 2017

## 2021-11-26 NOTE — ED Triage Notes (Signed)
Pt BIB EMS from [facility]. Pt went to UC for a headache x1 day, pt states sometimes her words do not come out the way they are supposed to for the past few weeks. UC told her to come to ED for a CT. Pt A&Ox4, clear speech, ambulatory.  150/70 82 HR 97% RA

## 2021-11-27 ENCOUNTER — Observation Stay (HOSPITAL_BASED_OUTPATIENT_CLINIC_OR_DEPARTMENT_OTHER): Payer: Medicare Other

## 2021-11-27 ENCOUNTER — Observation Stay (HOSPITAL_COMMUNITY): Payer: Medicare Other

## 2021-11-27 DIAGNOSIS — R29700 NIHSS score 0: Secondary | ICD-10-CM | POA: Diagnosis not present

## 2021-11-27 DIAGNOSIS — Z79899 Other long term (current) drug therapy: Secondary | ICD-10-CM | POA: Diagnosis not present

## 2021-11-27 DIAGNOSIS — R519 Headache, unspecified: Secondary | ICD-10-CM | POA: Diagnosis not present

## 2021-11-27 DIAGNOSIS — G459 Transient cerebral ischemic attack, unspecified: Secondary | ICD-10-CM

## 2021-11-27 DIAGNOSIS — I639 Cerebral infarction, unspecified: Secondary | ICD-10-CM | POA: Diagnosis not present

## 2021-11-27 DIAGNOSIS — R011 Cardiac murmur, unspecified: Secondary | ICD-10-CM | POA: Diagnosis not present

## 2021-11-27 DIAGNOSIS — R29818 Other symptoms and signs involving the nervous system: Secondary | ICD-10-CM | POA: Diagnosis not present

## 2021-11-27 DIAGNOSIS — Z7982 Long term (current) use of aspirin: Secondary | ICD-10-CM | POA: Diagnosis not present

## 2021-11-27 DIAGNOSIS — R0989 Other specified symptoms and signs involving the circulatory and respiratory systems: Secondary | ICD-10-CM

## 2021-11-27 DIAGNOSIS — Z7951 Long term (current) use of inhaled steroids: Secondary | ICD-10-CM | POA: Diagnosis not present

## 2021-11-27 DIAGNOSIS — I10 Essential (primary) hypertension: Secondary | ICD-10-CM | POA: Diagnosis not present

## 2021-11-27 DIAGNOSIS — E119 Type 2 diabetes mellitus without complications: Secondary | ICD-10-CM | POA: Diagnosis not present

## 2021-11-27 DIAGNOSIS — Z7984 Long term (current) use of oral hypoglycemic drugs: Secondary | ICD-10-CM | POA: Diagnosis not present

## 2021-11-27 DIAGNOSIS — R4781 Slurred speech: Secondary | ICD-10-CM | POA: Diagnosis not present

## 2021-11-27 LAB — LIPID PANEL
Cholesterol: 172 mg/dL (ref 0–200)
HDL: 59 mg/dL (ref >40)
LDL Cholesterol: 91 mg/dL (ref 0–99)
Total CHOL/HDL Ratio: 2.9 RATIO
Triglycerides: 112 mg/dL (ref <150)
VLDL: 22 mg/dL (ref 0–40)

## 2021-11-27 LAB — ECHOCARDIOGRAM COMPLETE
AR max vel: 1.93 cm2
AV Area VTI: 1.88 cm2
AV Area mean vel: 1.9 cm2
AV Mean grad: 5 mmHg
AV Peak grad: 9.9 mmHg
Ao pk vel: 1.57 m/s
Area-P 1/2: 3.06 cm2
Calc EF: 74.7 %
Height: 63 in
S' Lateral: 1.8 cm
Single Plane A2C EF: 77 %
Single Plane A4C EF: 71.6 %
Weight: 2225.76 oz

## 2021-11-27 LAB — BASIC METABOLIC PANEL
Anion gap: 9 (ref 5–15)
BUN: 17 mg/dL (ref 8–23)
CO2: 25 mmol/L (ref 22–32)
Calcium: 9.4 mg/dL (ref 8.9–10.3)
Chloride: 105 mmol/L (ref 98–111)
Creatinine, Ser: 0.92 mg/dL (ref 0.44–1.00)
GFR, Estimated: >60 mL/min (ref >60)
Glucose, Bld: 163 mg/dL — ABNORMAL HIGH (ref 70–99)
Potassium: 3.7 mmol/L (ref 3.5–5.1)
Sodium: 139 mmol/L (ref 135–145)

## 2021-11-27 LAB — GLUCOSE, CAPILLARY
Glucose-Capillary: 185 mg/dL — ABNORMAL HIGH (ref 70–99)
Glucose-Capillary: 249 mg/dL — ABNORMAL HIGH (ref 70–99)

## 2021-11-27 LAB — HEMOGLOBIN A1C
Hgb A1c MFr Bld: 7.8 % — ABNORMAL HIGH (ref 4.8–5.6)
Mean Plasma Glucose: 177.16 mg/dL

## 2021-11-27 LAB — BRAIN NATRIURETIC PEPTIDE: B Natriuretic Peptide: 24.7 pg/mL (ref 0.0–100.0)

## 2021-11-27 MED ORDER — INSULIN ASPART 100 UNIT/ML IJ SOLN
0.0000 [IU] | Freq: Every day | INTRAMUSCULAR | Status: DC
  Filled 2021-11-27: qty 0.05

## 2021-11-27 MED ORDER — ORAL CARE MOUTH RINSE: 15.0000 mL | OROMUCOSAL | Status: DC | PRN

## 2021-11-27 MED ORDER — ASPIRIN 325 MG PO TBEC
325.0000 mg | DELAYED_RELEASE_TABLET | Freq: Every day | ORAL | Status: DC
  Administered 2021-11-27: 325 mg via ORAL
  Filled 2021-11-27 (×2): qty 1

## 2021-11-27 MED ORDER — ACETAMINOPHEN 325 MG PO TABS: 650.0000 mg | ORAL_TABLET | ORAL | Status: DC | PRN

## 2021-11-27 MED ORDER — CLOPIDOGREL BISULFATE 300 MG PO TABS
300.0000 mg | ORAL_TABLET | Freq: Once | ORAL | Status: AC
  Administered 2021-11-27: 300 mg via ORAL
  Filled 2021-11-27: qty 1

## 2021-11-27 MED ORDER — ATORVASTATIN CALCIUM 20 MG PO TABS: 20.0000 mg | ORAL_TABLET | Freq: Every day | ORAL | 11 refills | Status: DC

## 2021-11-27 MED ORDER — ENOXAPARIN SODIUM 40 MG/0.4ML IJ SOSY
40.0000 mg | PREFILLED_SYRINGE | INTRAMUSCULAR | Status: DC
  Filled 2021-11-27: qty 0.4

## 2021-11-27 MED ORDER — INFLUENZA VAC A&B SA ADJ QUAD 0.5 ML IM PRSY: 0.5000 mL | PREFILLED_SYRINGE | INTRAMUSCULAR | Status: DC

## 2021-11-27 MED ORDER — SODIUM CHLORIDE 0.9 % IV SOLN: INTRAVENOUS | Status: DC

## 2021-11-27 MED ORDER — ACETAMINOPHEN 650 MG RE SUPP: 650.0000 mg | RECTAL | Status: DC | PRN

## 2021-11-27 MED ORDER — CLOPIDOGREL BISULFATE 75 MG PO TABS: 75.0000 mg | ORAL_TABLET | Freq: Every day | ORAL | 0 refills | Status: AC

## 2021-11-27 MED ORDER — INSULIN ASPART 100 UNIT/ML IJ SOLN
0.0000 [IU] | Freq: Three times a day (TID) | INTRAMUSCULAR | Status: DC
  Filled 2021-11-27: qty 0.09

## 2021-11-27 MED ORDER — STROKE: EARLY STAGES OF RECOVERY BOOK: Freq: Once | Status: DC

## 2021-11-27 MED ORDER — ACETAMINOPHEN 160 MG/5ML PO SOLN: 650.0000 mg | ORAL | Status: DC | PRN

## 2021-11-27 MED ORDER — CLOPIDOGREL BISULFATE 75 MG PO TABS
75.0000 mg | ORAL_TABLET | Freq: Every day | ORAL | Status: DC
  Administered 2021-11-27: 75 mg via ORAL
  Filled 2021-11-27 (×2): qty 1

## 2021-11-27 NOTE — Progress Notes (Signed)
Chaplain received a consult that Eulanda was requesting prayer.  Chaplain engaged [place] in a conversation to assess for spiritual needs.  Provided spiritual support through reflective listening and prayer.  [address], [place] Pager, [phone]

## 2021-11-27 NOTE — Plan of Care (Signed)
  Problem: Education: Goal: Ability to describe self-care measures that may prevent or decrease complications (Diabetes Survival Skills Education) will improve Outcome: Progressing   Problem: Coping: Goal: Ability to adjust to condition or change in health will improve Outcome: Progressing   Problem: Clinical Measurements: Goal: Ability to maintain clinical measurements within normal limits will improve Outcome: Progressing

## 2021-11-27 NOTE — Progress Notes (Signed)
OT Cancellation Note  Patient Details Name: Julane Crock MRN: [id number] DOB: 25-Dec-1940   Cancelled Treatment:    Reason Eval/Treat Not Completed: OT screened, no needs identified, will sign off  Jessicamarie Amiri L Teva Bronkema 11/27/2021, 11:41 AM

## 2021-11-27 NOTE — Progress Notes (Signed)
  Echocardiogram 2D Echocardiogram has been performed.  Patricia Mccullough 11/27/2021, 2:27 PM

## 2021-11-27 NOTE — H&P (Signed)
History and Physical    Patricia Mccullough TFT:[id number] DOB: 03-05-1940 DOA: 11/26/2021  PCP: Binnie Rail, MD  Patient coming from: Home  Chief Complaint: Slurred speech  HPI: Patricia Mccullough is a 81 y.o. female with medical history significant of type 2 diabetes, hypertension, hyperlipidemia presented to the ED with complaints of slurred speech and headache, symptom onset around 9 AM yesterday morning.  Symptoms resolved in the ED.  Vital signs stable.  No significant lab abnormalities. Teleneurology recommended admission for TIA work-up, continuing home aspirin and adding Plavix 300 mg load tonight followed by Plavix 75 mg daily.  Recommended CTA head and neck with contrast and brain MRI without contrast.  Patient was given Plavix 300 mg in the ED.  CTA done and showing no LVO.  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  Brain MRI pending.  TRH called to admit for TIA work-up.  Patient states yesterday morning around 9 AM she had sudden onset difficulty with her speech/word finding difficulty and a severe headache.  Symptoms have now resolved.  Patient denies history of stroke or TIA.  She denies weakness or numbness of her leg or arm or any vision changes.  No other complaints.  Denies fevers, cough, shortness of breath, chest pain, nausea, vomiting, abdominal pain, diarrhea, or any urinary symptoms.  She takes aspirin 325 mg daily.  Review of Systems:  Review of Systems  All other systems reviewed and are negative.   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 She has never used smokeless tobacco. She reports that she does not drink alcohol and does not use drugs.  No Known Allergies  Family History  Problem Relation Age of Onset   Breast cancer Mother    Stroke Sister    Heart Problems Sister     Prior to Admission medications   Medication Sig Start Date End Date Taking? Authorizing Provider  amLODipine (NORVASC) 5 MG tablet TAKE 1 TABLET BY MOUTH ONCE DAILY . APPOINTMENT REQUIRED FOR FUTURE REFILLS 08/21/21  Yes Binnie Rail, MD  aspirin EC 325 MG tablet Take 1 tablet (325 mg total) by mouth daily. 05/08/13  Yes Rowe Clack, MD  cetirizine (ZYRTEC) 5 MG tablet Take 1 tablet (5 mg total) by mouth at bedtime as needed for allergies. 10/06/21 01/04/22 Yes Burns, Claudina Lick, MD  estradiol (ESTRACE VAGINAL) 0.1 MG/GM vaginal cream Place 1 g vaginally 3 (three) times a week. At bedtime 11/18/21  Yes Chrzanowski, Jami B, NP  GARLIC-CALCIUM PO Take 1 tablet by mouth daily.   Yes [provider]  JARDIANCE 10 MG TABS tablet Take 1 tablet (10 mg total) by mouth every morning. 10/06/21  Yes Burns, Claudina Lick, MD  metFORMIN (GLUCOPHAGE) 1000 MG tablet TAKE 1 TABLET BY MOUTH TWICE DAILY WITH MEALS 11/23/21  Yes Burns, Claudina Lick, MD  SYSTANE BALANCE 0.6 % SOLN Place 1 drop into both eyes in the morning, at noon, in  the evening, and at bedtime. 07/15/21  Yes [provider]  Continuous Blood Gluc Sensor (FREESTYLE LIBRE 2 SENSOR) MISC Use daily to check blood sugars levels 4x a day (14 day sensor) 09/21/21   Burns, Claudina Lick, MD  Continuous Blood Gluc Sensor ([place]) MISC Use as directed to check sugars.  Dx  E11.9 with hyperglycemia 07/20/18   Burns, Claudina Lick, MD  fluticasone (FLONASE) 50 MCG/ACT nasal spray Place 1 spray into both nostrils daily. Begin by using 2 sprays in each nare daily for 3 to 5 days, then decrease to 1 spray in each  nare daily. Patient not taking: Reported on 10/23/2021 07/12/21   Lynden Oxford Scales, PA-C    Physical Exam: Vitals:   11/26/21 2200 11/26/21 2230 11/26/21 2354 11/27/21 0045  BP: (!) 148/67 (!) 142/68 (!) 140/75 (!) 110/90  Pulse: 79 76 80   Resp:  17 16   Temp:   98.3 F (36.8 C)   TempSrc:   Oral   SpO2: 99% 98% 100%     Physical Exam Vitals reviewed.  Constitutional:      General: She is not in acute distress. HENT:     Head: Normocephalic and atraumatic.  Eyes:     Extraocular Movements: Extraocular movements intact.  Cardiovascular:     Rate and Rhythm: Normal rate and regular rhythm.     Pulses: Normal pulses.     Heart sounds: Murmur heard.     Comments: Murmur best appreciated at the right upper sternal border second intercostal space Pulmonary:     Effort: Pulmonary effort is normal. No respiratory distress.     Breath sounds: Rales present. No wheezing.     Comments: Bibasilar rales Abdominal:     General: Bowel sounds are normal. There is no distension.     Palpations: Abdomen is soft.     Tenderness: There is no abdominal tenderness.  Musculoskeletal:        General: No swelling or tenderness.     Cervical back: Normal range of motion.  Skin:    General: Skin is warm and dry.  Neurological:     General: No focal deficit present.     Mental Status: She is alert and oriented to person, place, and time.     Cranial Nerves: No cranial nerve deficit.     Sensory: No sensory deficit.     Motor: No weakness.     Labs on Admission: I have personally reviewed following labs and imaging studies  CBC: Recent Labs  Lab 11/26/21 1940 11/26/21 2221  WBC 10.0  --   NEUTROABS 6.3  --   HGB 13.3 13.3  HCT 40.3 39.0  MCV 93.7  --   PLT 248  --    Basic Metabolic Panel: Recent Labs  Lab 11/26/21 2221  NA 141  K 4.2  CL 105  GLUCOSE 100*  BUN 17  CREATININE 0.80   GFR: CrCl cannot be calculated (Unknown ideal weight.). Liver Function Tests: No  results for input(s): "AST", "ALT", "ALKPHOS", "BILITOT", "PROT", "ALBUMIN" in the last 168 hours. No results for input(s): "LIPASE", "AMYLASE" in the last 168 hours. No results for input(s): "AMMONIA" in the last 168 hours. Coagulation Profile: No results for input(s): "INR", "PROTIME" in the last 168 hours. Cardiac Enzymes: No results for input(s): "CKTOTAL", "CKMB", "CKMBINDEX", "TROPONINI" in the last 168 hours. BNP (last 3 results) No results for input(s): "PROBNP" in the last 8760 hours. HbA1C: No results for input(s): "  HGBA1C" in the last 72 hours. CBG: Recent Labs  Lab 11/26/21 2002  GLUCAP 108*   Lipid Profile: No results for input(s): "CHOL", "HDL", "LDLCALC", "TRIG", "CHOLHDL", "LDLDIRECT" in the last 72 hours. Thyroid Function Tests: No results for input(s): "TSH", "T4TOTAL", "FREET4", "T3FREE", "THYROIDAB" in the last 72 hours. Anemia Panel: No results for input(s): "VITAMINB12", "FOLATE", "FERRITIN", "TIBC", "IRON", "RETICCTPCT" in the last 72 hours. Urine analysis:    Component Value Date/Time   COLORURINE YELLOW 01/19/2018 1145   APPEARANCEUR CLEAR 01/19/2018 1145   LABSPEC <=1.005 (A) 01/19/2018 1145   PHURINE 7.0 01/19/2018 1145   GLUCOSEU NEGATIVE 01/19/2018 1145   HGBUR NEGATIVE 01/19/2018 1145   BILIRUBINUR NEGATIVE 01/19/2018 1145   KETONESUR NEGATIVE 01/19/2018 1145   UROBILINOGEN 0.2 01/19/2018 1145   NITRITE NEGATIVE 01/19/2018 1145   LEUKOCYTESUR TRACE (A) 01/19/2018 1145    Radiological Exams on Admission: CT ANGIO HEAD NECK W WO CM  Result Date: 11/26/2021 CLINICAL DATA:  Word-finding difficulty EXAM: CT ANGIOGRAPHY HEAD AND NECK TECHNIQUE: Multidetector CT imaging of the head and neck was performed using the standard protocol during bolus administration of intravenous contrast. Multiplanar CT image reconstructions and MIPs were obtained to evaluate the vascular anatomy. Carotid stenosis measurements (when applicable) are obtained utilizing  NASCET criteria, using the distal internal carotid diameter as the denominator. RADIATION DOSE REDUCTION: This exam was performed according to the departmental dose-optimization program which includes automated exposure control, adjustment of the mA and/or kV according to patient size and/or use of iterative reconstruction technique. CONTRAST:  48m OMNIPAQUE IOHEXOL 350 MG/ML SOLN COMPARISON:  08/24/2020 FINDINGS: CT HEAD FINDINGS Brain: There is no mass, hemorrhage or extra-axial collection. Unchanged enlarged extra-axial spaces. There is hypoattenuation of the periventricular white matter, most commonly indicating chronic ischemic microangiopathy. Skull: The visualized skull base, calvarium and extracranial soft tissues are normal. Sinuses/Orbits: No fluid levels or advanced mucosal thickening of the visualized paranasal sinuses. No mastoid or middle ear effusion. The orbits are normal. CTA NECK FINDINGS SKELETON: There is no bony spinal canal stenosis. No lytic or blastic lesion. 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 UPPER CHEST: No pneumothorax or pleural effusion. No nodules or masses. AORTIC ARCH: There is calcific atherosclerosis of the aortic arch. There is no aneurysm, dissection or hemodynamically significant stenosis of the visualized portion of the aorta. Conventional 3 vessel aortic branching pattern. The visualized proximal subclavian arteries are widely patent. RIGHT CAROTID SYSTEM: Normal without aneurysm, dissection or stenosis. LEFT CAROTID SYSTEM: Normal without aneurysm, dissection or stenosis. VERTEBRAL ARTERIES: Left dominant configuration. Both origins are clearly patent. There is no dissection, occlusion or flow-limiting stenosis to the skull base (V1-V3 segments). CTA HEAD FINDINGS POSTERIOR CIRCULATION: --Vertebral arteries: Normal V4 segments. --Inferior cerebellar arteries: Normal. --Basilar artery: Normal.  --Superior cerebellar arteries: Normal. --Posterior cerebral arteries (PCA): Normal. ANTERIOR CIRCULATION: --Intracranial internal carotid arteries: Normal. --Anterior cerebral arteries (ACA): Normal. Both A1 segments are present. Patent anterior communicating artery (a-comm). --Middle cerebral arteries (MCA): Normal. VENOUS SINUSES: As permitted by contrast timing, patent. ANATOMIC VARIANTS: None Review of the MIP images confirms the above findings. IMPRESSION: 1. No emergent large vessel occlusion or hemodynamically significant stenosis of the head or neck. 2. 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 Electronically Signed   By: KUlyses JarredM.D.   On: 11/26/2021 23:41    EKG: No EKG done in the ED.  Ordered now and currently pending.  Assessment and Plan  Transient neurological symptoms Patient presenting with complaints of sudden onset word finding difficulty and headache at 9 AM yesterday. Symptoms have resolved.  No focal neurodeficit at this time.  CTA head and neck negative for LVO. Teleneurology saw the patient and felt that her symptoms were concerning for TIA.  Recommended admission for further work-up. -Telemetry monitoring -MRI of the brain without contrast pending -Echocardiogram -Hemoglobin A1c, fasting lipid panel -Continue aspirin 325 mg daily and Plavix 75 mg daily -Frequent neurochecks -PT, OT, speech therapy. -N.p.o. until cleared by bedside swallow evaluation or formal speech evaluation   Abnormal physical exam findings -Rales: No tachypnea or hypoxia.  Previous echo done in 2015 showing EF 55 to 15%, grade 1 diastolic dysfunction.  Chest x-ray and BNP ordered. -Murmur best appreciated in the aortic region: No aortic stenosis on echo done in 2015.  Repeat echocardiogram ordered.  Type 2 diabetes A1c 8.8 on 10/06/2021. -Repeat A1c -Sensitive sliding scale insulin -Hold oral hypoglycemic  agents  Hypertension -Allow permissive hypertension at this time.  Hyperlipidemia Not on a statin. -Lipid panel  DVT prophylaxis: Lovenox Code Status: Full Code (discussed with the patient) Family Communication: Husband at bedside. Consults called: Teleneurology  Level of care: Telemetry bed Admission status: It is my clinical opinion that referral for OBSERVATION is reasonable and necessary in this patient based on the above information provided. The aforementioned taken together are felt to place the patient at high risk for further clinical deterioration. However, it is anticipated that the patient may be medically stable for discharge from the hospital within 24 to 48 hours.   Shela Leff MD [facility]  If 7PM-7AM, please contact night-coverage [email or web address]  11/27/2021, 12:58 AM

## 2021-11-27 NOTE — ED Provider Notes (Signed)
Assumed care of patient at shift change pending teleneurology assessment and disposition.  Please see prior provider note for full H&P.  Briefly patient is an 81 year old female who presented to the emergency department after an episode of dysarthria and headache, currently asymptomatic without focal neurologic deficits.  Labs overall unremarkable.  CTAs as below.  Plan is for teleneurology consult and disposition.  IMPRESSION:  1. No emergent large vessel occlusion or hemodynamically significant  stenosis of the head or neck.  2. Unchanged 2.3 cm peripherally calcified mass at the inferior  right submandibular gland, unchanged since 01/07/2009.      Electronically Signed    By: Ulyses Jarred M.D.    On: 11/26/2021 23:41     I discussed with teleneurologist Dr. Wolfgang Phoenix- -recommendation is to admit for TIA work-up, recommends giving 300 mg loading dose of Plavix in the ED then can take 75 mg starting tomorrow.  Discussed with hospitalist Dr. Marlowe Sax who accepts admission.       Amaryllis Dyke, PA-C 68/03/21 2248    Delora Fuel, MD 25/00/37 [phone]

## 2021-11-27 NOTE — Consult Note (Signed)
[facility] TeleNeurology Consult Services  Stat Consult  Patient Name:   Patricia Mccullough, [place] Date of Birth:   December 09, 1940 Identification Number:   MRN - [id number] Date of Service:   11/26/2021 23:39:44  Diagnosis:       I63.9 - Cerebrovascular accident (CVA), unspecified mechanism ([place])  Impression 81 yo woman presenting for evaluation of slurred speech and headache. Symptoms have resolved. With her risk factors and age, certainly this could be a TIA. Recommend admission for further workup. Place on home aspirin and add plavix '300mg'$  load tonight and start '75mg'$  daily tomorrow. Obtain CTA H/N as an inpatient.   Recommendations: Our recommendations are outlined below.  Diagnostic Studies : MRI head without contrastCTA head and neck with contrast  Laboratory Studies : Lipid panel I ordered Hemoglobin A1c  Nursing Recommendations : IV Fluids, avoid dextrose containing fluids, Maintain euglycemiaNeuro checks q4 hrs x 24 hrs and then per shiftHead of bed 30 degreesContinue with Telemetry  Consultations : Recommend Speech therapy if failed dysphagia screenPhysical therapy/Occupational therapy  DVT Prophylaxis : Choice of Primary Team  Disposition : Neurology will follow   ----------------------------------------------------------------------------------------------------    Metrics: [facility] Notification Time: 11/26/2021 23:35:34 Stamp Time: 11/26/2021 23:39:44 Callback Response Time: 11/26/2021 23:40:44  Primary Provider Notified of Diagnostic Impression and Management Plan on: 11/27/2021 00:44:45     ----------------------------------------------------------------------------------------------------  Chief Complaint: slurred speech, headache  History of Present Illness: Patient is a 81 year old Female. This is an 81 yo woman who presents for evaluation of transient slurred speech and headache. Symptoms began around 9am yesterday  morning. She is not sure how long they lasted.  She has a history of HTN and DM. She takes aspirin daily. Denies history of stroke or TIA.  On exam, no focal deficits noted. She is awake, alert.    Past Medical History:      Hypertension      Diabetes Mellitus  Medications:  No Anticoagulant use  Antiplatelet use: Yes aspirin '325mg'$  dailyr Reviewed EMR for current medications  Allergies:  Reviewed  Social History: Smoking: No  Family History:  There is no family history of premature cerebrovascular disease pertinent to this consultation  ROS : 14 Points Review of Systems was performed and was negative except mentioned in HPI.  Past Surgical History: There Is No Surgical History Contributory To Today's Visit    Examination: BP(140/75), Pulse(80), 1A: Level of Consciousness - Alert; keenly responsive + 0 1B: Ask Month and Age - Both Questions Right + 0 1C: Blink Eyes & Squeeze Hands - Performs Both Tasks + 0 2: Test Horizontal Extraocular Movements - Normal + 0 3: Test Visual Fields - No Visual Loss + 0 4: Test Facial Palsy (Use Grimace if Obtunded) - Normal symmetry + 0 5A: Test Left Arm Motor Drift - No Drift for 10 Seconds + 0 5B: Test Right Arm Motor Drift - No Drift for 10 Seconds + 0 6A: Test Left Leg Motor Drift - No Drift for 5 Seconds + 0 6B: Test Right Leg Motor Drift - No Drift for 5 Seconds + 0 7: Test Limb Ataxia (FNF/Heel-Shin) - No Ataxia + 0 8: Test Sensation - Normal; No sensory loss + 0 9: Test Language/Aphasia - Normal; No aphasia + 0 10: Test Dysarthria - Normal + 0 11: Test Extinction/Inattention - No abnormality + 0  NIHSS Score: 0  Spoke with : Grooke Small    Patient / Family was informed the Neurology Consult would occur via TeleHealth consult by  way of interactive audio and video telecommunications and consented to receiving care in this manner.  Patient is being evaluated for possible acute neurologic impairment and high probability  of imminent or life - threatening deterioration.I spent total of 35 minutes providing care to this patient, including time for face to face visit via telemedicine, review of medical records, imaging studies and discussion of findings with providers, the patient and / or family.   Dr Precious Gilding   [facility] For Inpatient follow-up with [facility] physician please call [facility] [phone]. This is not an outpatient service. Post hospital discharge, please contact hospital directly.

## 2021-11-27 NOTE — TOC Progression Note (Signed)
Transition of Care [facility]) - Progression Note    Patient Details  Name: Patricia Mccullough MRN: [id number] Date of Birth: 1940-07-11  Transition of Care [facility]) CM/SW Contact  Henrietta Dine, RN Phone Number: 11/27/2021, 9:54 AM  Clinical Narrative:     Transition of Care [facility]) Screening Note   Patient Details  Name: Patricia Mccullough Date of Birth: 21-Jan-1941   Transition of Care [facility]) CM/SW Contact:    Henrietta Dine, RN Phone Number: 11/27/2021, 9:54 AM    Transition of Care Department [facility]) has reviewed patient and no TOC needs have been identified at this time. We will continue to monitor patient advancement through interdisciplinary progression rounds. If new patient transition needs arise, please place a TOC consult.          Expected Discharge Plan and Services                                                 Social Determinants of Health (SDOH) Interventions    Readmission Risk Interventions     No data to display

## 2021-11-27 NOTE — Evaluation (Signed)
Physical Therapy One Time Evaluation Patient Details Name: Patricia Mccullough MRN: [id number] DOB: [birth year] Today's Date: 11/27/2021  History of Present Illness  81 y.o. female with medical history significant of type 2 diabetes, hypertension, hyperlipidemia presented to the ED with complaints of slurred speech and headache.  Symptoms resolved in the ED and pt admitted for TIA work up.  Clinical Impression  Patient evaluated by Physical Therapy with no further acute PT needs identified. All education has been completed and the patient has no further questions. See below for any follow-up Physical Therapy or equipment needs. PT is signing off. Thank you for this referral.         Recommendations for follow up therapy are one component of a multi-disciplinary discharge planning process, led by the attending physician.  Recommendations may be updated based on patient status, additional functional criteria and insurance authorization.  Follow Up Recommendations No PT follow up      Assistance Recommended at Discharge None  Patient can return home with the following       Equipment Recommendations None recommended by PT  Recommendations for Other Services       Functional Status Assessment Patient has not had a recent decline in their functional status     Precautions / Restrictions Precautions Precautions: None      Mobility  Bed Mobility Overal bed mobility: Modified Independent                  Transfers Overall transfer level: Modified independent                      Ambulation/Gait Ambulation/Gait assistance: Modified independent (Device/Increase time) Gait Distance (Feet): 400 Feet Assistive device: None Gait Pattern/deviations: WFL(Within Functional Limits)       General Gait Details: no symptoms reported  Stairs            Wheelchair Mobility    Modified Rankin (Stroke Patients Only)       Balance Overall balance  assessment: No apparent balance deficits (not formally assessed)                                           Pertinent Vitals/Pain Pain Assessment Pain Assessment: No/denies pain    Home Living Family/patient expects to be discharged to:: Private residence Living Arrangements: Spouse/significant other;Children   Type of Home: House Home Access: Stairs to enter   Technical brewer of Steps: 3   Home Layout: One level Home Equipment: Cane - single point      Prior Function Prior Level of Function : Independent/Modified Independent                     Hand Dominance        Extremity/Trunk Assessment   Upper Extremity Assessment Upper Extremity Assessment: Overall WFL for tasks assessed    Lower Extremity Assessment Lower Extremity Assessment: Overall WFL for tasks assessed    Cervical / Trunk Assessment Cervical / Trunk Assessment: Normal  Communication   Communication: No difficulties  Cognition Arousal/Alertness: Awake/alert Behavior During Therapy: WFL for tasks assessed/performed Overall Cognitive Status: Within Functional Limits for tasks assessed  General Comments      Exercises     Assessment/Plan    PT Assessment Patient does not need any further PT services  PT Problem List         PT Treatment Interventions      PT Goals (Current goals can be found in the Care Plan section)  Acute Rehab PT Goals PT Goal Formulation: All assessment and education complete, DC therapy    Frequency       Co-evaluation               AM-PAC PT "6 Clicks" Mobility  Outcome Measure Help needed turning from your back to your side while in a flat bed without using bedrails?: None Help needed moving from lying on your back to sitting on the side of a flat bed without using bedrails?: None Help needed moving to and from a bed to a chair (including a wheelchair)?:  None Help needed standing up from a chair using your arms (e.g., wheelchair or bedside chair)?: None Help needed to walk in hospital room?: None Help needed climbing 3-5 steps with a railing? : None 6 Click Score: 24    End of Session Equipment Utilized During Treatment: Gait belt Activity Tolerance: Patient tolerated treatment well Patient left: in chair;with call bell/phone within reach;with chair alarm set Nurse Communication: Mobility status PT Visit Diagnosis: Difficulty in walking, not elsewhere classified (R26.2)    Time: 0950-1005 PT Time Calculation (min) (ACUTE ONLY): 15 min   Charges:   PT Evaluation $PT Eval Low Complexity: 1 Low         Kati PT, DPT Physical Therapist Acute Rehabilitation Services Preferred contact method: Secure Chat Weekend Pager Only: [phone] Office: [phone]   Myrtis Hopping [place] 11/27/2021, 11:47 AM

## 2021-11-27 NOTE — Discharge Summary (Signed)
Physician Discharge Summary  Patricia Mccullough LKG:[id number] DOB: 1940/06/25 DOA: 11/26/2021  PCP: Binnie Rail, MD  Admit date: 11/26/2021 Discharge date: 11/27/2021  Admitted From: Home Disposition: Home  Recommendations for Outpatient Follow-up:  Follow up with PCP in 1-2 weeks We will send referral to neurology for follow-up.  Home Health: N/A Equipment/Devices: N/A  Discharge Condition: Stable CODE STATUS: Full code Diet recommendation: Low-salt and low-carb diet  Discharge summary:  81 year old female with history of type 2 diabetes on Januvia and metformin, hypertension, hyperlipidemia not on treatment presented to the emergency room with acute onset of slurred speech and headache that lasted about 1 to 2 hours and resolved by the time she arrived to the ER.  Vital signs stable in the ER.  CT head was normal.  She was seen as a code stroke, was mostly asymptomatic in the ER.  Underwent TIA work-up as below.  TIA:  Clinical findings, sudden onset slurred speech and headache spontaneously improved. CT head findings, no acute findings. MRI of the brain, no acute findings. CTA of the head and neck, no large vessel occlusion.  No stenosis. 2D echocardiogram,normal EF, asymmetrical left ventricular hypertrophy, no thrombus Antiplatelet therapy, aspirin 325 mg daily at home. LDL 90.  Not on a statin at home.  Hemoglobin A1c, 7.8.  Goal less than 7. Therapy recommendations, speech, PT OT has no new recommendations. Was seen by telemetry neurology on admission. Plan: As patient is medically stable and asymptomatic today no neurological deficits, patient to be discharged home on loading dose of Plavix 300 mg daily that was given in the ER followed by Plavix 75 mg daily for 21 days, aspirin 325 mg daily to continue. Patient is not on a statin at home, LDL is 90, she will benefit with LDL less than 70.  We will start patient on Lipitor 20 mg daily. Blood pressures are stable  and she will resume her medications on discharge. Hemoglobin A1c is 7.8, recently started on second agent.  She will follow-up outpatient. Stable for discharge.     Discharge Diagnoses:  Principal Problem:   Transient neurological symptoms Active Problems:   Type 2 diabetes mellitus ([place])   Essential hypertension   Hyperlipidemia   [place]ales   Murmur    Discharge Instructions  Discharge Instructions     Ambulatory referral to Neurology   Complete by: As directed    An appointment is requested in approximately: 4 weeks   Diet - low sodium heart healthy   Complete by: As directed    Diet Carb Modified   Complete by: As directed    Discharge instructions   Complete by: As directed    Start taking metformin 10/14 evening only   Increase activity slowly   Complete by: As directed       Allergies as of 11/27/2021   No Known Allergies      Medication List     STOP taking these medications    fluticasone 50 MCG/ACT nasal spray Commonly known as: FLONASE       TAKE these medications    amLODipine 5 MG tablet Commonly known as: NORVASC TAKE 1 TABLET BY MOUTH ONCE DAILY . APPOINTMENT REQUIRED FOR FUTURE REFILLS   aspirin EC 325 MG tablet Take 1 tablet (325 mg total) by mouth daily.   atorvastatin 20 MG tablet Commonly known as: Lipitor Take 1 tablet (20 mg total) by mouth daily.   cetirizine 5 MG tablet Commonly known as: ZYRTEC Take 1 tablet (5  mg total) by mouth at bedtime as needed for allergies.   clopidogrel 75 MG tablet Commonly known as: PLAVIX Take 1 tablet (75 mg total) by mouth daily for 21 days. Start taking on: November 28, 2021   estradiol 0.1 MG/GM vaginal cream Commonly known as: ESTRACE VAGINAL Place 1 g vaginally 3 (three) times a week. At bedtime   FreeStyle [facility] Use as directed to check sugars.  Dx  E11.9 with hyperglycemia   FreeStyle Libre 2 Sensor Misc Use daily to check blood sugars levels 4x a day (14 day  sensor)   GARLIC-CALCIUM PO Take 1 tablet by mouth daily.   Jardiance 10 MG Tabs tablet Generic drug: empagliflozin Take 1 tablet (10 mg total) by mouth every morning.   metFORMIN 1000 MG tablet Commonly known as: GLUCOPHAGE TAKE 1 TABLET BY MOUTH TWICE DAILY WITH MEALS   Systane Balance 0.6 % Soln Generic drug: Propylene Glycol Place 1 drop into both eyes in the morning, at noon, in the evening, and at bedtime.        No Known Allergies  Consultations: Tele neuro on arrival to ER   Procedures/Studies: MR BRAIN WO CONTRAST  Result Date: 11/27/2021 CLINICAL DATA:  81 year old female with word finding difficulty, slurred speech and headache yesterday 0900 hours. TIA. EXAM: MRI HEAD WITHOUT CONTRAST TECHNIQUE: Multiplanar, multiecho pulse sequences of the brain and surrounding structures were obtained without intravenous contrast. COMPARISON:  CT head, CTA head and neck yesterday. Brain MRI 05/17/2013. FINDINGS: Brain: No restricted diffusion to suggest acute infarction. No midline shift, mass effect, evidence of mass lesion, extra-axial collection or acute intracranial hemorrhage. Cervicomedullary junction and pituitary are within normal limits. Chronic nonspecific ventriculomegaly, with some generalized cerebral volume loss since 2015. No evidence of transependymal edema. Temporal horns are relatively diminutive. Chronic scattered and patchy bilateral cerebral white matter T2 and FLAIR hyperintensity has not significantly changed. Mild to moderate T2 heterogeneity in the pons has mildly progressed. But no cortical encephalomalacia or chronic cerebral blood products identified. 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 Vascular: Major intracranial vascular flow voids are stable since 2015. Some chronic generalized intracranial artery tortuosity. Skull and upper cervical spine: Negative for age visible cervical spine.  Visualized bone marrow signal is within normal limits. Sinuses/Orbits: Stable orbits. Chronic paranasal sinus inflammation, fluid level now in the left maxillary sinus. Other: Mastoids remain clear. Grossly normal visible internal auditory structures appear normal. Negative visible scalp and face. IMPRESSION: 1. No acute intracranial abnormality. 2. Evidence of chronic small vessel disease, moderate for age, but relatively mild progression since a 2015 MRI. 3. Chronic paranasal sinus inflammation, with a fluid level now in the left maxillary sinus raising the possibility of acute sinusitis. Electronically Signed   By: Genevie Ann M.D.   On: 11/27/2021 09:21   DG Chest 2 View  Result Date: 11/27/2021 CLINICAL DATA:  Rales. EXAM: CHEST - 2 VIEW COMPARISON:  08/21/2020. FINDINGS: The heart size and mediastinal contours are within normal limits. There is atherosclerotic calcification of the aorta. No consolidation, effusion, or pneumothorax. Degenerative changes are present in the thoracic spine. IMPRESSION: No active cardiopulmonary disease. Electronically Signed   By: Brett Fairy M.D.   On: 11/27/2021 02:27   CT ANGIO HEAD NECK W WO CM  Result Date: 11/26/2021 CLINICAL DATA:  Word-finding difficulty EXAM: CT ANGIOGRAPHY HEAD AND NECK TECHNIQUE: Multidetector CT imaging of the head and neck was performed using the standard  protocol during bolus administration of intravenous contrast. Multiplanar CT image reconstructions and MIPs were obtained to evaluate the vascular anatomy. Carotid stenosis measurements (when applicable) are obtained utilizing NASCET criteria, using the distal internal carotid diameter as the denominator. RADIATION DOSE REDUCTION: This exam was performed according to the departmental dose-optimization program which includes automated exposure control, adjustment of the mA and/or kV according to patient size and/or use of iterative reconstruction technique. CONTRAST:  16m OMNIPAQUE IOHEXOL  350 MG/ML SOLN COMPARISON:  08/24/2020 FINDINGS: CT HEAD FINDINGS Brain: There is no mass, hemorrhage or extra-axial collection. Unchanged enlarged extra-axial spaces. There is hypoattenuation of the periventricular white matter, most commonly indicating chronic ischemic microangiopathy. Skull: The visualized skull base, calvarium and extracranial soft tissues are normal. Sinuses/Orbits: No fluid levels or advanced mucosal thickening of the visualized paranasal sinuses. No mastoid or middle ear effusion. The orbits are normal. CTA NECK FINDINGS SKELETON: There is no bony spinal canal stenosis. No lytic or blastic lesion. 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 UPPER CHEST: No pneumothorax or pleural effusion. No nodules or masses. AORTIC ARCH: There is calcific atherosclerosis of the aortic arch. There is no aneurysm, dissection or hemodynamically significant stenosis of the visualized portion of the aorta. Conventional 3 vessel aortic branching pattern. The visualized proximal subclavian arteries are widely patent. RIGHT CAROTID SYSTEM: Normal without aneurysm, dissection or stenosis. LEFT CAROTID SYSTEM: Normal without aneurysm, dissection or stenosis. VERTEBRAL ARTERIES: Left dominant configuration. Both origins are clearly patent. There is no dissection, occlusion or flow-limiting stenosis to the skull base (V1-V3 segments). CTA HEAD FINDINGS POSTERIOR CIRCULATION: --Vertebral arteries: Normal V4 segments. --Inferior cerebellar arteries: Normal. --Basilar artery: Normal. --Superior cerebellar arteries: Normal. --Posterior cerebral arteries (PCA): Normal. ANTERIOR CIRCULATION: --Intracranial internal carotid arteries: Normal. --Anterior cerebral arteries (ACA): Normal. Both A1 segments are present. Patent anterior communicating artery (a-comm). --Middle cerebral arteries (MCA): Normal. VENOUS SINUSES: As permitted by contrast timing,  patent. ANATOMIC VARIANTS: None Review of the MIP images confirms the above findings. IMPRESSION: 1. No emergent large vessel occlusion or hemodynamically significant stenosis of the head or neck. 2. 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 Electronically Signed   By: KUlyses JarredM.D.   On: 11/26/2021 23:41   (Echo, Carotid, EGD, Colonoscopy, ERCP)    Subjective: Patient was seen and examined.  In the morning rounds, she denied any complaints but being hungry.  She is moving around by herself.  Walking in the hallway.  Eager to go home.   Discharge Exam: Vitals:   11/27/21 1049 11/27/21 1340  BP: 129/64 (!) 118/58  Pulse: 73 78  Resp: 20 20  Temp: 97.6 F (36.4 C) 98 F (36.7 C)  SpO2: 99% 99%   Vitals:   11/27/21 0213 11/27/21 0628 11/27/21 1049 11/27/21 1340  BP: 130/71 (!) 151/74 129/64 (!) 118/58  Pulse: 82 77 73 78  Resp: '16 18 20 20  '$ Temp: 98.6 F (37 C) 98.3 F (36.8 C) 97.6 F (36.4 C) 98 F (36.7 C)  TempSrc: Oral Oral Oral Oral  SpO2: 100% 100% 99% 99%  Weight: 63.1 kg     Height: '5\' 3"'$  (1.6 m)       General: Pt is alert, awake, not in acute distress Cardiovascular: RRR, S1/S2 +, no rubs, no gallops Respiratory: CTA bilaterally, no wheezing, no rhonchi Abdominal: Soft, NT, ND, bowel sounds + Extremities: no edema, no cyanosis Speech is fluent.  No cranial nerve deficits.  No motor or sensory deficits.    The results of significant diagnostics from this hospitalization (including imaging, microbiology, ancillary and laboratory) are listed below for reference.     Microbiology: Recent Results (from the past 240 hour(s))  WET PREP FOR [place], YEAST, CLUE     Status: None   Collection Time: 11/18/21 12:04 PM   Specimen: Genital  Result Value Ref Range Status   Source: VAGINA  Final   RESULT   Final    Comment: EPITHELIAL CELLS-PRESENT CLUE CELLS-NONE SEEN YEAST-NONE  SEEN TRICHOMONAS-NONE SEEN WBC-MANY BACTERIA-MANY [place]. CELLS <6 HPF      Labs: BNP (last 3 results) Recent Labs    11/27/21 0340  BNP 92.1   Basic Metabolic Panel: Recent Labs  Lab 11/26/21 2221 11/27/21 0340  NA 141 139  K 4.2 3.7  CL 105 105  CO2  --  25  GLUCOSE 100* 163*  BUN 17 17  CREATININE 0.80 0.92  CALCIUM  --  9.4   Liver Function Tests: No results for input(s): "AST", "ALT", "ALKPHOS", "BILITOT", "PROT", "ALBUMIN" in the last 168 hours. No results for input(s): "LIPASE", "AMYLASE" in the last 168 hours. No results for input(s): "AMMONIA" in the last 168 hours. CBC: Recent Labs  Lab 11/26/21 1940 11/26/21 2221  WBC 10.0  --   NEUTROABS 6.3  --   HGB 13.3 13.3  HCT 40.3 39.0  MCV 93.7  --   PLT 248  --    Cardiac Enzymes: No results for input(s): "CKTOTAL", "CKMB", "CKMBINDEX", "TROPONINI" in the last 168 hours. BNP: Invalid input(s): "POCBNP" CBG: Recent Labs  Lab 11/26/21 2002 11/27/21 0247 11/27/21 1338  GLUCAP 108* 185* 249*   D-Dimer No results for input(s): "DDIMER" in the last 72 hours. Hgb A1c Recent Labs    11/27/21 0340  HGBA1C 7.8*   Lipid Profile Recent Labs    11/27/21 0340  CHOL 172  HDL 59  LDLCALC 91  TRIG 112  CHOLHDL 2.9   Thyroid function studies No results for input(s): "TSH", "T4TOTAL", "T3FREE", "THYROIDAB" in the last 72 hours.  Invalid input(s): "FREET3" Anemia work up No results for input(s): "VITAMINB12", "FOLATE", "FERRITIN", "TIBC", "IRON", "RETICCTPCT" in the last 72 hours. Urinalysis    Component Value Date/Time   COLORURINE YELLOW 01/19/2018 1145   APPEARANCEUR CLEAR 01/19/2018 1145   LABSPEC <=1.005 (A) 01/19/2018 1145   PHURINE 7.0 01/19/2018 1145   GLUCOSEU NEGATIVE 01/19/2018 1145   HGBUR NEGATIVE 01/19/2018 1145   BILIRUBINUR NEGATIVE 01/19/2018 1145   KETONESUR NEGATIVE 01/19/2018 1145   UROBILINOGEN 0.2 01/19/2018 1145   NITRITE NEGATIVE 01/19/2018 1145   LEUKOCYTESUR TRACE  (A) 01/19/2018 1145   Sepsis Labs Recent Labs  Lab 11/26/21 1940  WBC 10.0   Microbiology Recent Results (from the past 240 hour(s))  WET PREP FOR [place], YEAST, CLUE     Status: None   Collection Time: 11/18/21 12:04 PM   Specimen: Genital  Result Value Ref Range Status   Source: VAGINA  Final   RESULT   Final    Comment: EPITHELIAL CELLS-PRESENT CLUE CELLS-NONE SEEN YEAST-NONE SEEN [place]. CELLS <6 HPF      Time coordinating discharge: 35 minutes  SIGNED:   Barb Merino, MD  [facility] 11/27/2021, 1:49 PM

## 2021-11-30 ENCOUNTER — Other Ambulatory Visit: Payer: Self-pay | Admitting: Internal Medicine

## 2021-12-03 DIAGNOSIS — H3509 Other intraretinal microvascular abnormalities: Secondary | ICD-10-CM | POA: Diagnosis not present

## 2021-12-03 DIAGNOSIS — E113293 Type 2 diabetes mellitus with mild nonproliferative diabetic retinopathy without macular edema, bilateral: Secondary | ICD-10-CM | POA: Diagnosis not present

## 2021-12-03 LAB — HM DIABETES EYE EXAM

## 2021-12-05 ENCOUNTER — Other Ambulatory Visit: Payer: Self-pay | Admitting: Internal Medicine

## 2021-12-15 ENCOUNTER — Ambulatory Visit
Admission: RE | Admit: 2021-12-15 | Discharge: 2021-12-15 | Disposition: A | Discharge status: Home / Self Care | Payer: Medicare Other | Source: Ambulatory Visit | Attending: Internal Medicine | Admitting: Internal Medicine

## 2021-12-15 ENCOUNTER — Other Ambulatory Visit: Payer: Self-pay | Admitting: Internal Medicine

## 2021-12-15 DIAGNOSIS — N6489 Other specified disorders of breast: Secondary | ICD-10-CM

## 2021-12-15 DIAGNOSIS — R922 Inconclusive mammogram: Secondary | ICD-10-CM | POA: Diagnosis not present

## 2021-12-15 DIAGNOSIS — N6320 Unspecified lump in the left breast, unspecified quadrant: Secondary | ICD-10-CM

## 2021-12-15 DIAGNOSIS — N6323 Unspecified lump in the left breast, lower outer quadrant: Secondary | ICD-10-CM | POA: Diagnosis not present

## 2021-12-15 DIAGNOSIS — R92332 Mammographic heterogeneous density, left breast: Secondary | ICD-10-CM | POA: Diagnosis not present

## 2021-12-18 ENCOUNTER — Ambulatory Visit
Admission: RE | Admit: 2021-12-18 | Discharge: 2021-12-18 | Disposition: A | Discharge status: Home / Self Care | Payer: Medicare Other | Source: Ambulatory Visit | Attending: Internal Medicine | Admitting: Internal Medicine

## 2021-12-18 DIAGNOSIS — N6320 Unspecified lump in the left breast, unspecified quadrant: Secondary | ICD-10-CM

## 2021-12-18 DIAGNOSIS — N6323 Unspecified lump in the left breast, lower outer quadrant: Secondary | ICD-10-CM | POA: Diagnosis not present

## 2021-12-18 DIAGNOSIS — N6012 Diffuse cystic mastopathy of left breast: Secondary | ICD-10-CM | POA: Diagnosis not present

## 2021-12-18 HISTORY — PX: BREAST BIOPSY: SHX20

## 2021-12-28 ENCOUNTER — Other Ambulatory Visit: Payer: Self-pay | Admitting: Internal Medicine

## 2021-12-28 DIAGNOSIS — Z1231 Encounter for screening mammogram for malignant neoplasm of breast: Secondary | ICD-10-CM

## 2021-12-29 ENCOUNTER — Ambulatory Visit (INDEPENDENT_AMBULATORY_CARE_PROVIDER_SITE_OTHER): Payer: Medicare Other | Admitting: Neurology

## 2021-12-29 ENCOUNTER — Encounter: Payer: Self-pay | Admitting: Neurology

## 2021-12-29 VITALS — BP 161/71 | HR 82 | Ht 63.0 in | Wt 144.0 lb

## 2021-12-29 DIAGNOSIS — G459 Transient cerebral ischemic attack, unspecified: Secondary | ICD-10-CM

## 2021-12-29 NOTE — Progress Notes (Signed)
[facility]  PATIENT: Patricia Mccullough DOB: 08/22/1940  REQUESTING CLINICIAN: Barb Merino, MD HISTORY FROM: Patient and chart review  REASON FOR VISIT: TIA    HISTORICAL  CHIEF COMPLAINT:  Chief Complaint  Patient presents with   New Patient (Initial Visit)    Rm 12. Alone. NP/internal referral for TIA d/c home 10/13.    HISTORY OF PRESENT ILLNESS:  This is a 81 year old woman with past medical history of hypertension, hyperlipidemia, diabetes mellitus who is presenting after being admitted to the hospital for TIA.  Patient reports the morning of October 12 she had slurred speech and headaches, by the time she reached the hospital the headaches and slurred speech were gone.  She was admitted for TIA work-up.  MRI negative for any acute stroke, and discharged with DAPT for 21 days and patient to continue with aspirin daily.  She was initially started on aspirin 325 mg by PCP which was continued.  She reports since the discharge from the hospital she has not had any additional events or any additional episode.  Currently she is back to her baseline.  She does exercise, reported she walks a lot.  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  No other complaints.    Hospital Course  81 year old female with history of type 2 diabetes on Januvia and metformin, hypertension, hyperlipidemia not on treatment presented to the emergency room with acute onset of slurred speech and headache that lasted about 1 to 2 hours and resolved by the time she arrived to the ER.  Vital signs stable in the ER.  CT head was normal.  She was seen as a code stroke, was mostly asymptomatic in the ER.  Underwent TIA work-up as below.   TIA:  Clinical findings, sudden onset slurred speech and headache spontaneously improved. CT head findings, no acute findings. MRI of the brain, no acute findings. CTA of the head and neck, no large vessel  occlusion.  No stenosis. 2D echocardiogram,normal EF, asymmetrical left ventricular hypertrophy, no thrombus Antiplatelet therapy, aspirin 325 mg daily at home. LDL 90.  Not on a statin at home.  Hemoglobin A1c, 7.8.  Goal less than 7. Therapy recommendations, speech, PT OT has no new recommendations. Was seen by telemetry neurology on admission. Plan: As patient is medically stable and asymptomatic today no neurological deficits, patient to be discharged home on loading dose of Plavix 300 mg daily that was given in the ER followed by Plavix 75 mg daily for 21 days, aspirin 325 mg daily to continue. Patient is not on a statin at home, LDL is 90, she will benefit with LDL less than 70.  We will start patient on Lipitor 20 mg daily. Blood pressures are stable and she will resume her medications on discharge. Hemoglobin A1c is 7.8, recently started on second agent.  She will follow-up outpatient. Stable for discharge  OTHER MEDICAL CONDITIONS: Hypertension, hyperlipidemia, Diabetes   REVIEW OF SYSTEMS: Full 14 system review of systems performed and negative with exception of: As noted in the HPI   ALLERGIES: No Known Allergies  HOME MEDICATIONS: Outpatient Medications Prior to Visit  Medication Sig Dispense Refill   amLODipine (NORVASC) 5 MG tablet TAKE 1 TABLET BY MOUTH ONCE DAILY . APPOINTMENT REQUIRED FOR FUTURE REFILLS 90 tablet 0   aspirin EC 325 MG tablet Take 1 tablet (325 mg total) by mouth daily. 100 tablet 3   atorvastatin (LIPITOR) 20 MG tablet Take  1 tablet (20 mg total) by mouth daily. 30 tablet 11   Continuous Blood Gluc Sensor (FREESTYLE LIBRE 2 SENSOR) MISC Use daily to check blood sugars levels 4x a day (14 day sensor) 2 each 6   Continuous Blood Gluc Sensor (FREESTYLE LIBRE SENSOR SYSTEM) MISC Use as directed to check sugars.  Dx  E11.9 with hyperglycemia 1 each 0   estradiol (ESTRACE VAGINAL) 0.1 MG/GM vaginal cream Place 1 g vaginally 3 (three) times a week. At bedtime  60.6 g 12   GARLIC-CALCIUM PO Take 1 tablet by mouth daily.     JARDIANCE 10 MG TABS tablet Take 1 tablet (10 mg total) by mouth every morning. 30 tablet 5   metFORMIN (GLUCOPHAGE) 1000 MG tablet TAKE 1 TABLET BY MOUTH TWICE DAILY WITH MEALS 180 tablet 0   SYSTANE BALANCE 0.6 % SOLN Place 1 drop into both eyes in the morning, at noon, in the evening, and at bedtime.     cetirizine (ZYRTEC) 5 MG tablet Take 1 tablet (5 mg total) by mouth at bedtime as needed for allergies. 30 tablet 2   No facility-administered medications prior to visit.    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   Thyroid nodule 05/15/2013   Right side, IR bx 3/01: benign follicular nodule    PAST SURGICAL HISTORY: Past Surgical History:  Procedure Laterality Date   CATARACT EXTRACTION  12/16/2008   MM BREAST STEREO BIOPSY LEFT ([place] HX) Left 11/2021   Vetrectomy  02/16/2007    FAMILY HISTORY: Family History  Problem Relation Age of Onset   Breast cancer Mother    Stroke Sister    Heart Problems Sister     SOCIAL HISTORY: Social History   Socioeconomic History   Marital status: Married    Spouse name: Not on file   Number of children: 3   Years of education: Not on file   Highest education level: Not on file  Occupational History   Not on file  Tobacco Use   Smoking status: Never   Smokeless tobacco: Never  Vaping Use   Vaping Use: Never used  Substance and Sexual Activity   Alcohol use: No   Drug use: No   Sexual activity: Not Currently    Partners: Male    Comment: 1st intercourse 81 yo-Fewer than 5 partners  Other Topics Concern   Not on file  Social History Narrative   Married, lives with spouse and their grown son. 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   Social Determinants of Health   Financial Resource Strain: Low Risk  (05/19/2021)   Overall Financial Resource Strain (CARDIA)    Difficulty of Paying Living Expenses: Not hard at all  Food Insecurity: No Food Insecurity (11/27/2021)   Hunger Vital Sign    Worried About Running Out of Food in the Last Year: Never true    Ran Out of Food in the Last Year: Never true  Transportation Needs: No Transportation Needs (11/27/2021)   PRAPARE - Hydrologist (Medical): No    Lack of Transportation (Non-Medical): No  Physical Activity: Sufficiently Active (05/19/2021)   Exercise Vital Sign  Days of Exercise per Week: 5 days    Minutes of Exercise per Session: 30 min  Stress: No Stress Concern Present (05/19/2021)   [place]    Feeling of Stress : Not at all  Social Connections: [place] (05/19/2021)   Social Connection and Isolation Panel [NHANES]    Frequency of Communication with Friends and Family: More than three times a week    Frequency of Social Gatherings with Friends and Family: More than three times a week    Attends Religious Services: More than 4 times per year    Active Member of [employer] or Organizations: Yes    Attends Music therapist: More than 4 times per year    Marital Status: Married  Human resources officer Violence: Not At Risk (11/27/2021)   Humiliation, Afraid, Rape, and Kick questionnaire    Fear of Current or Ex-Partner: No    Emotionally Abused: No    Physically Abused: No    Sexually Abused: No    PHYSICAL EXAM  GENERAL EXAM/CONSTITUTIONAL: Vitals:  Vitals:   12/29/21 1416  BP: (!) 161/71  Pulse: 82  Weight: 144 lb (65.3 kg)  Height: _0  (1.6 m)   Body mass index is 25.51 kg/m. Wt Readings from Last 3 Encounters:  12/29/21 144 lb (65.3 kg)  11/27/21 139  lb 1.8 oz (63.1 kg)  10/14/21 146 lb (66.2 kg)   Patient is in no distress; well developed, nourished and groomed; neck is supple  EYES: Pupils round and reactive to light, Visual fields full to confrontation, Extraocular movements intacts,   MUSCULOSKELETAL: Gait, strength, tone, movements noted in Neurologic exam below  NEUROLOGIC: MENTAL STATUS:     01/18/2017   10:08 AM  MMSE - Mini Mental State Exam  Orientation to time 5  Orientation to Place 5  Registration 3  Attention/ Calculation 5  Recall 2  Language- name 2 objects 2  Language- repeat 1  Language- follow 3 step command 3  Language- read & follow direction 1  Write a sentence 1  Copy design 1  Total score 29   awake, alert, oriented to person, place and time recent and remote memory intact normal attention and concentration language fluent, comprehension intact, naming intact fund of knowledge appropriate  CRANIAL NERVE:  2nd, 3rd, 4th, 6th - pupils equal and reactive to light, visual fields full to confrontation, extraocular muscles intact, no nystagmus 5th - facial sensation symmetric 7th - facial strength symmetric 8th - hearing intact 9th - palate elevates symmetrically, uvula midline 11th - shoulder shrug symmetric 12th - tongue protrusion midline  MOTOR:  normal bulk and tone, full strength in the BUE, BLE  SENSORY:  normal and symmetric to light touch  COORDINATION:  finger-nose-finger, fine finger movements normal  REFLEXES:  deep tendon reflexes present and symmetric  GAIT/STATION:  normal   DIAGNOSTIC DATA (LABS, IMAGING, TESTING) - I reviewed patient records, labs, notes, testing and imaging myself where available.  Lab Results  Component Value Date   WBC 10.0 11/26/2021   HGB 13.3 11/26/2021   HCT 39.0 11/26/2021   MCV 93.7 11/26/2021   PLT 248 11/26/2021      Component Value Date/Time   NA 139 11/27/2021 0340   K 3.7 11/27/2021 0340   CL 105 11/27/2021 0340   CO2 25  11/27/2021 0340   GLUCOSE 163 (H) 11/27/2021 0340   BUN 17 11/27/2021 0340   CREATININE 0.92 11/27/2021 0340  CREATININE 1.10 03/20/2013 1503   CALCIUM 9.4 11/27/2021 0340   PROT 7.3 10/06/2021 1122   ALBUMIN 4.1 10/06/2021 1122   AST 13 10/06/2021 1122   ALT 12 10/06/2021 1122   ALKPHOS 65 10/06/2021 1122   BILITOT 0.4 10/06/2021 1122   GFRNONAA >60 11/27/2021 0340   GFRAA  01/09/2009 0420    >60        The eGFR has been calculated using the MDRD equation. This calculation has not been validated in all clinical situations. eGFR's persistently <60 mL/min signify possible Chronic Kidney Disease.   Lab Results  Component Value Date   CHOL 172 11/27/2021   HDL 59 11/27/2021   LDLCALC 91 11/27/2021   LDLDIRECT 43.0 10/06/2021   TRIG 112 11/27/2021   CHOLHDL 2.9 11/27/2021   Lab Results  Component Value Date   HGBA1C 7.8 (H) 11/27/2021   No results found for: "VITAMINB12" Lab Results  Component Value Date   TSH 1.78 09/12/2020    MRI Brain 11/27/21 1. No acute intracranial abnormality. 2. Evidence of chronic small vessel disease, moderate for age, but relatively mild progression since a 2015 MRI. 3. Chronic paranasal sinus inflammation, with a fluid level now in the left maxillary sinus raising the possibility of acute sinusitis.  CTA Head and Neck 11/27/21 No emergent large vessel occlusion or hemodynamically significant stenosis of the head or neck.  Echo 11/27/21 1. No significant LVOT obstruction. Left ventricular ejection fraction, by estimation, is 60 to 65%. The left ventricle has normal function. The left ventricle has no regional wall motion abnormalities. There is severe asymmetric left ventricular hypertrophy. Left ventricular diastolic parameters are consistent with Grade I diastolic dysfunction (impaired relaxation). The average left ventricular global longitudinal strain is -25.0 %. The global longitudinal strain is normal.  2. Right ventricular systolic  function is normal. The right ventricular size is normal. There is normal pulmonary artery systolic pressure.  3. No evidence of mitral valve regurgitation. Moderate mitral annular calcification.  4. The aortic valve was not well visualized. There is mild calcification of the aortic valve. Aortic valve regurgitation is not visualized     ASSESSMENT AND PLAN  81 y.o. year old female with vascular risk factors including hypertension, hyperlipidemia, diabetes mellitus who is presenting after TIA.  MRI was negative.  Patient was started on DAPT for 21 days which she completed and currently on aspirin 325 mg.  ASA 325 was started prior to the TIA and she was continued on aspirin 325.  At this moment, we will recommend patient to continue current medications, continue to follow with PCP and return in 1 year or sooner if worse.  She voices understanding.   1. TIA (transient ischemic attack)      Patient Instructions  Continue current medications  Follow up with PCP  Return in a year or sooner if worse   No orders of the defined types were placed in this encounter.   No orders of the defined types were placed in this encounter.   Return in about 1 year (around 12/30/2022).  I have spent a total of 46 minutes dedicated to this patient today, preparing to see patient, performing a medically appropriate examination and evaluation, ordering tests and/or medications and procedures, and counseling and educating the patient/family/caregiver; independently interpreting result and communicating results to the family/patient/caregiver; and documenting clinical information in the electronic medical record.   Alric Ran, MD 12/29/2021, 2:45 PM  [facility] [address] [place], [place] [id number] [phone]

## 2021-12-29 NOTE — Patient Instructions (Signed)
Continue current medications  Follow up with PCP  Return in a year or sooner if worse

## 2021-12-30 ENCOUNTER — Ambulatory Visit
Admission: EM | Admit: 2021-12-30 | Discharge: 2021-12-30 | Disposition: A | Discharge status: Home / Self Care | Payer: Medicare Other | Attending: Physician Assistant | Admitting: Physician Assistant

## 2021-12-30 DIAGNOSIS — H00015 Hordeolum externum left lower eyelid: Secondary | ICD-10-CM | POA: Diagnosis not present

## 2021-12-30 MED ORDER — POLYMYXIN B-TRIMETHOPRIM 10000-0.1 UNIT/ML-% OP SOLN: 1.0000 [drp] | OPHTHALMIC | 0 refills | Status: AC

## 2021-12-30 NOTE — ED Provider Notes (Signed)
[facility]    CSN: [id number] Arrival date & time: 12/30/21  1418      History   Chief Complaint Chief Complaint  Patient presents with   Eye Problem    HPI Patricia Mccullough is a 81 y.o. female.   Patient here today for evaluation of irritation and swelling to her left lower lid that she first noticed yesterday. She reports some tenderness to touch. She has not had fever. She denies any eye pain otherwise. She has not tried any treatment for symptoms.   The history is provided by the patient.  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   Thyroid nodule 05/15/2013   Right side, IR bx 9/93: benign follicular nodule    Patient Active Problem List   Diagnosis Date Noted   Transient neurological symptoms 11/27/2021   [place] 11/27/2021   Murmur 11/27/2021   Uterine prolapse 08/28/2019   Urinary frequency 01/19/2018   Submandibular gland swelling 01/19/2018   Leg cramps 01/14/2016   Thyroid nodule 05/15/2013   History of TIA (transient ischemic attack) 05/08/2013   Hyperlipidemia 06/24/2010   Mild nonproliferative diabetic retinopathy ([place]) 02/04/2010   ALLERGIC URTICARIA 01/20/2009   Essential hypertension 01/03/2009   Type 2 diabetes mellitus ([place]) 01/02/2009    Past Surgical History:  Procedure Laterality Date   CATARACT EXTRACTION  12/16/2008   MM BREAST STEREO BIOPSY LEFT ([place] HX) Left 11/2021   Vetrectomy  02/16/2007    OB History     Gravida  2   Para  2   Term  2   Preterm      AB      Living  2      SAB      IAB      Ectopic      Multiple      Live Births               Home Medications    Prior to Admission medications   Medication Sig  Start Date End Date Taking? Authorizing Provider  trimethoprim-polymyxin b (POLYTRIM) ophthalmic solution Place 1 drop into the left eye every 4 (four) hours for 7 days. 12/30/21 01/06/22 Yes Francene Finders, PA-C  amLODipine (NORVASC) 5 MG tablet TAKE 1 TABLET BY MOUTH ONCE DAILY . APPOINTMENT REQUIRED FOR FUTURE REFILLS 12/07/21   Binnie Rail, MD  aspirin EC 325 MG tablet Take 1 tablet (325 mg total) by mouth daily. 05/08/13   Rowe Clack, MD  atorvastatin (LIPITOR) 20 MG tablet Take 1 tablet (20 mg total) by mouth daily. 11/27/21 11/27/22  Barb Merino, MD  Continuous Blood Gluc Sensor (FREESTYLE LIBRE 2 SENSOR) MISC Use daily to check blood sugars levels 4x a day (14 day sensor) 09/21/21   Burns, Claudina Lick, MD  Continuous Blood Gluc Sensor ([place]) MISC Use as directed to check sugars.  Dx  E11.9 with hyperglycemia 07/20/18   Binnie Rail, MD  estradiol (ESTRACE VAGINAL) 0.1 MG/GM vaginal cream Place 1 g vaginally 3 (three) times a week. At bedtime 11/18/21   Chrzanowski, Wende Crease B, NP  GARLIC-CALCIUM PO Take 1 tablet by mouth daily.    [provider]  JARDIANCE 10 MG TABS tablet Take 1 tablet (10 mg total) by mouth every morning. 10/06/21   Burns, Claudina Lick, MD  metFORMIN (GLUCOPHAGE) 1000 MG tablet TAKE 1 TABLET BY MOUTH TWICE DAILY WITH MEALS 11/23/21   Burns, Claudina Lick, MD  SYSTANE BALANCE 0.6 % SOLN Place 1 drop into both eyes in the morning, at noon, in the evening, and at bedtime. 07/15/21   [provider]    Family History Family History  Problem Relation Age of Onset   Breast cancer Mother    Stroke Sister    Heart Problems Sister     Social History Social History   Tobacco Use   Smoking status: Never   Smokeless tobacco: Never  Vaping Use   Vaping Use: Never used  Substance Use Topics   Alcohol use: No   Drug use: No     Allergies   Patient has no known allergies.   Review of Systems Review of Systems  Constitutional:   Negative for chills and fever.  HENT:  Negative for congestion and rhinorrhea.   Eyes:  Negative for discharge and redness.       Eye lid redness and swelling   Respiratory:  Negative for cough and shortness of breath.      Physical Exam Triage Vital Signs ED Triage Vitals [12/30/21 1541]  Enc Vitals Group     BP (!) 151/76     Pulse Rate 75     Resp 17     Temp 97.8 F (36.6 C)     Temp Source Oral     SpO2 97 %     Weight      Height      Head Circumference      Peak Flow      Pain Score 4     Pain Loc      Pain Edu?      Excl. in [place]?    No data found.  Updated Vital Signs BP (!) 151/76 (BP Location: Left Arm)   Pulse 75   Temp 97.8 F (36.6 C) (Oral)   Resp 17   SpO2 97%   Visual Acuity Right Eye Distance:   Left Eye Distance:   Bilateral Distance:    Right Eye Near:   Left Eye Near:    Bilateral Near:     Physical Exam Vitals and nursing note reviewed.  Constitutional:      General: She is not in acute distress.    Appearance: Normal appearance. She is not ill-appearing.  HENT:     Head: Normocephalic and atraumatic.     Nose: Nose normal. No congestion or rhinorrhea.     Mouth/Throat:     Pharynx: Oropharynx is clear.  Eyes:     Conjunctiva/sclera: Conjunctivae normal.     Comments: Mild swelling and erythema to left lower lid  Cardiovascular:     Rate and Rhythm: Normal rate.  Pulmonary:     Effort: Pulmonary effort is normal. No respiratory distress.  Neurological:     Mental Status: She is alert.  Psychiatric:        Mood and Affect: Mood normal.        Behavior: Behavior normal.      UC Treatments / Results  Labs (all labs ordered are listed, but only abnormal results are displayed) Labs Reviewed - No data to display  EKG   Radiology No results found.  Procedures Procedures (including critical care time)  Medications Ordered  in UC Medications - No data to display  Initial Impression / Assessment and Plan / UC Course   I have reviewed the triage vital signs and the nursing notes.  Pertinent labs & imaging results that were available during my care of the patient were reviewed by me and considered in my medical decision making (see chart for details).    Recommended alternating warm and cool compresses and antibiotic drop prescribed for treatment of stye. Encouraged follow up if no gradual improvement or with any further concerns.   Final Clinical Impressions(s) / UC Diagnoses   Final diagnoses:  Hordeolum externum of left lower eyelid   Discharge Instructions   None    ED Prescriptions     Medication Sig Dispense Auth. Provider   trimethoprim-polymyxin b (POLYTRIM) ophthalmic solution Place 1 drop into the left eye every 4 (four) hours for 7 days. 10 mL Francene Finders, PA-C      PDMP not reviewed this encounter.   Francene Finders, PA-C 12/30/21 1614

## 2021-12-30 NOTE — ED Triage Notes (Signed)
Pt presents with left eye irritation that is tender to touch since yesterday.

## 2022-01-21 ENCOUNTER — Encounter: Payer: Self-pay | Admitting: Internal Medicine

## 2022-01-21 NOTE — Progress Notes (Signed)
Outside notes received. Information abstracted. Notes sent to scan.  

## 2022-03-05 ENCOUNTER — Other Ambulatory Visit: Payer: Self-pay | Admitting: Internal Medicine

## 2022-03-26 ENCOUNTER — Encounter: Payer: Self-pay | Admitting: Obstetrics and Gynecology

## 2022-03-26 ENCOUNTER — Ambulatory Visit (INDEPENDENT_AMBULATORY_CARE_PROVIDER_SITE_OTHER): Payer: Medicare Other | Admitting: Obstetrics and Gynecology

## 2022-03-26 VITALS — BP 163/69 | HR 75 | Ht 61.5 in | Wt 139.0 lb

## 2022-03-26 DIAGNOSIS — N812 Incomplete uterovaginal prolapse: Secondary | ICD-10-CM | POA: Diagnosis not present

## 2022-03-26 DIAGNOSIS — N952 Postmenopausal atrophic vaginitis: Secondary | ICD-10-CM

## 2022-03-26 DIAGNOSIS — N8110 Cystocele, unspecified: Secondary | ICD-10-CM

## 2022-03-26 DIAGNOSIS — N816 Rectocele: Secondary | ICD-10-CM

## 2022-03-26 DIAGNOSIS — R350 Frequency of micturition: Secondary | ICD-10-CM | POA: Diagnosis not present

## 2022-03-26 DIAGNOSIS — R339 Retention of urine, unspecified: Secondary | ICD-10-CM

## 2022-03-26 DIAGNOSIS — R82998 Other abnormal findings in urine: Secondary | ICD-10-CM | POA: Diagnosis not present

## 2022-03-26 LAB — POCT URINALYSIS DIPSTICK
Bilirubin, UA: NEGATIVE
Glucose, UA: POSITIVE — AB
Ketones, UA: NEGATIVE
Nitrite, UA: NEGATIVE
Protein, UA: NEGATIVE
Spec Grav, UA: 1.02 (ref 1.010–1.025)
Urobilinogen, UA: 0.2 E.U./dL
pH, UA: 7 (ref 5.0–8.0)

## 2022-03-26 MED ORDER — ESTRADIOL 7.5 MCG/24HR VA RING: 1.0000 | VAGINAL_RING | VAGINAL | 3 refills | Status: AC

## 2022-03-26 NOTE — Progress Notes (Signed)
Patricia Mccullough and Consultation  Referring Provider: Kerry Dory, NP PCP: Binnie Rail, MD Date of Service: 03/26/2022  SUBJECTIVE Chief Complaint: New Patient (Initial Visit) Patricia Mccullough is a 82 y.o. female here for a prolapse consult. Pt uses a pessary.)  History of Present Illness: Patricia Mccullough is a 82 y.o. Black or African-American female seen in consultation at the request of NP Rubbie Battiest for Mccullough of prolapse.    Review of records from significant for: Has uterine prolapse. Has #8 ring with support in place.   Last Hgb A1c on 11/27/21 was 7.8 (previously higher)  Urinary Symptoms: Does not leak urine usually but occasionally has leakage on the way to the bathroom on the way to the bathroom.  Leaks 1 time(s) per week.  Pad use: liner- mostly for discharge She is not bothered by her UI symptoms.  Day time voids 2-3.  Nocturia: 2 times per night to void. Voiding dysfunction: she empties her bladder well.  does not use a catheter to empty bladder.  When urinating, she feels the need to urinate multiple times in a row  UTIs:  0  UTI's in the last year.   Denies history of blood in urine and kidney or bladder stones  Pelvic Organ Prolapse Symptoms:                  She Admits to a feeling of a bulge the vaginal area. It has been present for about 1 year.  She Admits to seeing a bulge.  This bulge was bothersome. She feels the pessary is working well for her.  Has a #8 ring pessary. Has some discharge.  Was prescribed vaginal estrogen cream but has not been using it regularly. She cannot get it inside easily.   Bowel Symptom: Bowel movements: 1 time(s) per day Stool consistency: hard Straining: yes.  Splinting: yes.  Incomplete evacuation: yes.  She Denies accidental bowel leakage / fecal incontinence Bowel regimen:  senna   Sexual Function Sexually active: no.   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   Thyroid nodule 05/15/2013   Right side, IR bx [id number]: benign follicular nodule   TIA (transient ischemic attack)      Past Surgical History:   Past Surgical History:  Procedure Laterality Date   CATARACT EXTRACTION  12/16/2008   MM BREAST STEREO BIOPSY LEFT ([place] [facility]) Left 11/2021   Vetrectomy  02/16/2007     Past OB/GYN History: OB History  Gravida Para Term Preterm AB Living  2 2 2     2  $ SAB IAB Ectopic Multiple Live Births          2    # Outcome Date GA Lbr Len/2nd Weight Sex Delivery Anes PTL Lv  2 Term           1 Term            Vaginal deliveries: 2 Menopausal: Denies vaginal bleeding since menopause Any history of abnormal pap smears: no.   Medications: She has a current medication list which includes the following prescription(s): amlodipine, aspirin ec, atorvastatin, freestyle libre 2 sensor, freestyle libre sensor system, estradiol, garlic-calcium,  jardiance, metformin, and systane balance.   Allergies: Patient has No Known Allergies.   Social History:  Social History   Tobacco Use   Smoking status: Never   Smokeless tobacco: Never  Vaping Use   Vaping Use: Never used  Substance Use Topics   Alcohol use: No   Drug use: No    Relationship status: married She lives with husband and son.   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 Regular exercise: Yes:   History of abuse: No  Family History:   Family History  Problem Relation Age of Onset   Breast cancer Mother    Stroke Sister    Heart Problems Sister      Review of Systems: Review of Systems  Constitutional:  Negative for fever, malaise/fatigue and weight loss.  Respiratory:  Negative for cough, shortness  of breath and wheezing.   Cardiovascular:  Negative for chest pain, palpitations and leg swelling.  Gastrointestinal:  Negative for abdominal pain and blood in stool.  Genitourinary:  Negative for dysuria.       +vaginal discharge  Musculoskeletal:  Negative for myalgias.  Skin:  Negative for rash.  Neurological:  Negative for dizziness and headaches.  Endo/Heme/Allergies:  Does not bruise/bleed easily.  Psychiatric/Behavioral:  Negative for depression. The patient is not nervous/anxious.      OBJECTIVE Physical Exam: Vitals:   03/26/22 0910 03/26/22 1014  BP: (!) 147/66 (!) 163/69  Pulse: 73 75  Weight: 139 lb (63 kg)   Height: 5' 1.5" (1.562 m)     Physical Exam Constitutional:      General: She is not in acute distress. Pulmonary:     Effort: Pulmonary effort is normal.  Abdominal:     General: There is no distension.     Palpations: Abdomen is soft.     Tenderness: There is no abdominal tenderness. There is no rebound.  Musculoskeletal:        General: No swelling. Normal range of motion.  Skin:    General: Skin is warm and dry.     Findings: No rash.  Neurological:     Mental Status: She is alert and oriented to person, place, and time.  Psychiatric:        Mood and Affect: Mood normal.        Behavior: Behavior normal.      GU / Detailed Urogynecologic Mccullough:  Pelvic Exam: Normal external female genitalia; Bartholin's and Skene's glands normal in appearance; urethral meatus normal in appearance, no urethral masses or discharge.   CST: negative  Pessary removed and cleaned and not replaced. Given to patient to keep.   Speculum exam reveals normal vaginal mucosa with atrophy. Excoriations noted at the vaginal apex and at the introitus, treated with silver nitrate. Cervix normal appearance. Uterus normal single, nontender. Adnexa no mass, fullness, tenderness.     Pelvic floor strength I/V  Pelvic floor musculature: Right levator non-tender, Right  obturator non-tender, Left levator non-tender, Left obturator non-tender  POP-Q:   POP-Q  0                                            Aa   0  Ba  -4.5                                              C   5                                            Gh  4                                            Pb  11                                            tvl   -1                                            Ap  -1                                            Bp  -7.5                                              D      Rectal Exam:  Normal external rectum  Post-Void Residual (PVR) by Bladder Scan: In order to evaluate bladder emptying, we discussed obtaining a postvoid residual and she agreed to this procedure.  Procedure: The ultrasound unit was placed on the patient's abdomen in the suprapubic region after the patient had voided. A PVR of 285 ml was obtained by bladder scan.  Laboratory Results: POC urine: small leukocytes   ASSESSMENT AND PLAN Ms. Halker is a 82 y.o. with:  1. Uterovaginal prolapse, incomplete   2. Prolapse of posterior vaginal wall   3. Prolapse of anterior vaginal wall   4. Vaginal atrophy   5. Urinary frequency   6. Leukocytes in urine   7. Incomplete bladder emptying    Stage II anterior, Stage II posterior, Stage I apical prolapse - Suspect that prolapse is more advanced but has been held back by the pessary. Will leave pessary out and have her return next week for a fitting of a different pessary. Feel the #8 ring is putting too much pressure on the vaginal walls.  - Discussed option of surgery if we cannot find a pessary that would work for her. Reviewed option of colpocleisis and handout provided.   2. Vaginal atrophy - recommended using vaginal estrogen nightly until appt next week.  - Ordered estring to be placed once every 3 months. She will bring with her to her next appt.   3. Leukocytes in urine -  will send for culture  4. Incomplete bladder emptying - possibly due to prolapse or large pessary.  -  Will evaluate further with urodynamic testing.   Return for pessary fitting and urodynamics   Jaquita Folds, MD

## 2022-03-26 NOTE — Patient Instructions (Addendum)
Use estrogen cream nightly until your next appointment.   I have ordered the estrogen ring to the pharmacy. Pick it up from the pharmacy and bring to your next appointment.   We will try a different pessary at your next appointment.   URODYNAMICS (UDS) TEST INFORMATION  IMPORTANT: Please try to arrive with a comfortably full bladder!    What is UDS? Urodynamics is a bladder test used to evaluate how your bladder and urethra (tube you urinate out of) work to help find out the cause of your bladder symptoms and evaluate your bladder function in order to make the best treatment plan for you.   What to expect? A nurse will perform the test and will be with you during the entire exam. First we will have to empty your bladder on a special toilet.  After you have emptied your bladder, very small catheters (plastic tubing) will be placed into your bladder and into your vagina (or rectum). These special small catheters measure pressure to help measure your bladder function.  Your bladder will be gently filled with water and you will be asked to cough and strain at several different points during the test.   You will then be asked to empty your bladder in the special toilet with the catheters in place. Most patients can urinate (pee) easily with the catheters in place since the catheters are so small. In total this procedure lasts about 45 minutes to 1 hour.  After your test is completed, you will return (or possibly be seen the same day) to review the results, talk about treatment options and make a plan moving forward.

## 2022-03-29 LAB — URINE CULTURE

## 2022-03-31 ENCOUNTER — Encounter: Payer: Self-pay | Admitting: Obstetrics and Gynecology

## 2022-03-31 ENCOUNTER — Ambulatory Visit (INDEPENDENT_AMBULATORY_CARE_PROVIDER_SITE_OTHER): Payer: Medicare Other | Admitting: Obstetrics and Gynecology

## 2022-03-31 VITALS — BP 142/89 | HR 89

## 2022-03-31 DIAGNOSIS — N816 Rectocele: Secondary | ICD-10-CM | POA: Diagnosis not present

## 2022-03-31 DIAGNOSIS — N8110 Cystocele, unspecified: Secondary | ICD-10-CM | POA: Diagnosis not present

## 2022-03-31 DIAGNOSIS — N812 Incomplete uterovaginal prolapse: Secondary | ICD-10-CM | POA: Diagnosis not present

## 2022-03-31 DIAGNOSIS — I10 Essential (primary) hypertension: Secondary | ICD-10-CM

## 2022-03-31 DIAGNOSIS — N952 Postmenopausal atrophic vaginitis: Secondary | ICD-10-CM

## 2022-03-31 NOTE — Progress Notes (Unsigned)
[facility]   Subjective:     Chief Complaint: Pessary fitting Patricia Mccullough is a 82 y.o. female is here for pessary fitting.)  History of Present Illness: Patricia Mccullough is a 82 y.o. female with stage II pelvic organ prolapse who presents today for a pessary fitting.    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   Past Surgical History: She  has a past surgical history that includes Cataract extraction (12/16/2008); Vetrectomy (02/16/2007); and MM BREAST STEREO BIOPSY LEFT ([facility]) (Left, 11/2021).   Medications: She has a current medication list which includes the following prescription(s): amlodipine, aspirin ec, atorvastatin, freestyle libre 2 sensor, freestyle libre sensor system, estradiol, fluticasone, jardiance, metformin, and systane balance.   Allergies: Patient has No Known Allergies.   Social History: Patient  reports that she has never smoked. She has never used smokeless tobacco. She reports that she does not drink alcohol and does not use drugs.      Objective:    BP (!) 142/89   Pulse 89  Gen: No apparent distress, A&O x 3. Pelvic Exam: Normal external female genitalia; Bartholin's and Skene's glands normal in appearance; urethral meatus normal in appearance, no urethral masses or discharge.   A size #3 cube pessary (Lot [id number]) was fitted. It was comfortable, stayed in place with valsalva and was an appropriate size on examination, with one finger fitting between the pessary and the vaginal walls. Upon re-examination of tissues, excoriation from previous pessary was noted to be re-irritated by pessary trial today. Will leave pessary out at this time.   Assessment/Plan:    Assessment: Ms.  Mccullough is a 82 y.o. with stage II pelvic organ prolapse who presents for a pessary fitting. Plan: She was fitted with a #3 cube pessary that was not inserted at today's appointment.   She has been using the vaginal estrogen cream but with the need for cube pessary, it is too difficult to get the vaginal estrogen cream behind the cube when inserted and her vaginal atrophy is significant enough that she would benefit more from the estrogen ring that can sit behind a pessary and decrease risk of vaginal erosion.   Patient to do estrogen application nightly until Monday.   Follow-up on Monday 04/05/2022 for e-string placement and for placement of pessary post-urodynamics.   Berton Mount, NP

## 2022-03-31 NOTE — Patient Instructions (Addendum)
Please pick up E-string and bring it to your appointment on Monday.   Continue to use the estrogen cream every night.   Come with a comfortably full bladder on Monday.   We will insert the estrogen cream and the pessary on Monday.

## 2022-04-05 ENCOUNTER — Encounter: Payer: Self-pay | Admitting: *Deleted

## 2022-04-05 ENCOUNTER — Ambulatory Visit (INDEPENDENT_AMBULATORY_CARE_PROVIDER_SITE_OTHER): Payer: Medicare Other | Admitting: Obstetrics and Gynecology

## 2022-04-05 ENCOUNTER — Encounter: Payer: Self-pay | Admitting: Obstetrics and Gynecology

## 2022-04-05 VITALS — BP 131/63 | HR 77

## 2022-04-05 DIAGNOSIS — R339 Retention of urine, unspecified: Secondary | ICD-10-CM | POA: Diagnosis not present

## 2022-04-05 DIAGNOSIS — R350 Frequency of micturition: Secondary | ICD-10-CM

## 2022-04-05 LAB — POCT URINALYSIS DIPSTICK
Bilirubin, UA: NEGATIVE
Blood, UA: NEGATIVE
Glucose, UA: POSITIVE — AB
Ketones, UA: NEGATIVE
Leukocytes, UA: NEGATIVE
Nitrite, UA: NEGATIVE
Protein, UA: POSITIVE — AB
Spec Grav, UA: 1.01 (ref 1.010–1.025)
Urobilinogen, UA: 0.2 E.U./dL
pH, UA: 6.5 (ref 5.0–8.0)

## 2022-04-05 NOTE — Progress Notes (Signed)
[facility] Urodynamics Procedure  Referring Physician: Binnie Rail, MD Date of Procedure: 04/05/2022  Patricia Mccullough is a 82 y.o. female who presents for urodynamic evaluation. Indication(s) for study: SUI and OAB  Vital Signs: BP 131/63   Pulse 77   Laboratory Results: A clean catch urine specimen revealed:  POC urine: Positive for glucose and protein, negative for all other components   Voiding Diary: Not Done  Procedure Timeout:  The correct patient was verified and the correct procedure was verified. The patient was in the correct position and safety precautions were reviewed based on at the patient's history.  Urodynamic Procedure A 1F dual lumen urodynamics catheter was placed under sterile conditions into the patient's bladder. A 1F catheter was placed into the rectum in order to measure abdominal pressure. EMG patches were placed in the appropriate position.  All connections were confirmed and calibrations/adjusted made. Saline was instilled into the bladder through the dual lumen catheters.  Cough/valsalva pressures were measured periodically during filling.  Patient was allowed to void.  The bladder was then emptied of its residual.  UROFLOW: Revealed a Qmax of 20 mL/sec.  She voided 266 mL and had a residual of 50 mL.  It was a normal pattern and represented normal habits.   CMG: This was performed with sterile water in the sitting position at a fill rate of 20-30 mL/min.    First sensation of fullness was 68 mLs,  First urge was 202 mLs,  Strong urge was 290 mLs and  Capacity was 365 mLs  Stress incontinence was demonstrated Lowest positive Barrier CLPP was 8 cmH20 at 353 ml but patient was attempting to stand and cough. Once patient started leaking she was unable to stop more urination from occurring.  Lowest positive Barrier VLPP was 52 cmH20 at 353 ml, this occurred following the leaking after standing attempt noted above.   Detrusor  function was overactive, with phasic contractions seen.  The first occurred at 9 mL to 5.6 cm of water and was associated with urge.  Compliance:  Normal. End fill detrusor pressure was 0cmH20.    UPP: MUCP with barrier reduction was 68 cm of water.    MICTURITION STUDY: Voiding was performed with reduction using scopettes in the sitting position.  Pdet at Qmax was 85 cm of water.  Qmax was 83.2 mL/sec.  It was a interrupted pattern.  She voided 191 mL and had a residual of 175 mL.  It was a volitional void, sustained detrusor contraction was present and abdominal straining was present initially and then the catheter was expelled, so abdominal straining was unable to be assessed during entirety of void.   EMG: This was performed with patches.  She had voluntary contractions, recruitment with fill was present and urethral sphincter was not relaxed with void.  The details of the procedure with the study tracings have been scanned into EPIC.   Urodynamic Impression:  1. Sensation was normal; capacity was normal 2. Stress Incontinence was demonstrated at normal pressures; 3. Detrusor Overactivity was demonstrated without leakage. 4. Emptying was dysfunctional with a minimally elevated PVR, a sustained detrusor contraction present,  abdominal straining present at beginning of void and unable to be assessed for the rest, dyssynergic urethral sphincter activity on EMG.  Plan: - The patient will follow up  to discuss the findings and treatment options.

## 2022-04-05 NOTE — Patient Instructions (Signed)
Taking Care of Yourself after Urodynamics Drink plenty of water for a day or two following your procedure. Try to have about 8 ounces (one cup) at a time, and do this 6 times or more per day unless you have fluid restrictitons AVOID irritative beverages such as coffee, tea, soda, alcoholic or citrus drinks for a day or two, as this may cause burning with urination. You may experience some discomfort or a burning sensation with urination after having this procedure. You can use over the counter Azo or pyridium to help with burning and follow the instructions on the packaging. If it does not improve within 1-2 days, or other symptoms appear (fever, chills, or difficulty urinating) call the office to speak to a nurse.  You may return to normal daily activities such as work, school, driving, exercising and housework on the day of the procedure.

## 2022-04-05 NOTE — Progress Notes (Unsigned)
Patient Estring ( estradiol vaginal ring) was denied on 04/02/2022. A peer to peer was done by Deetta Perla, DNP. Approval has been granted.

## 2022-04-07 ENCOUNTER — Encounter: Payer: Self-pay | Admitting: Internal Medicine

## 2022-04-07 NOTE — Patient Instructions (Addendum)
Blood work was ordered.   The lab is on the first floor.    Medications changes include :   none     Return in about 6 months (around 10/07/2022) for follow up, Schedule DEXA-[place].    Health Maintenance, Female Adopting a healthy lifestyle and getting preventive care are important in promoting health and wellness. Ask your health care provider about: The right schedule for you to have regular tests and exams. Things you can do on your own to prevent diseases and keep yourself healthy. What should I know about diet, weight, and exercise? Eat a healthy diet  Eat a diet that includes plenty of vegetables, fruits, low-fat dairy products, and lean protein. Do not eat a lot of foods that are high in solid fats, added sugars, or sodium. Maintain a healthy weight Body mass index (BMI) is used to identify weight problems. It estimates body fat based on height and weight. Your health care provider can help determine your BMI and help you achieve or maintain a healthy weight. Get regular exercise Get regular exercise. This is one of the most important things you can do for your health. Most adults should: Exercise for at least 150 minutes each week. The exercise should increase your heart rate and make you sweat (moderate-intensity exercise). Do strengthening exercises at least twice a week. This is in addition to the moderate-intensity exercise. Spend less time sitting. Even light physical activity can be beneficial. Watch cholesterol and blood lipids Have your blood tested for lipids and cholesterol at 81 years of age, then have this test every 5 years. Have your cholesterol levels checked more often if: Your lipid or cholesterol levels are high. You are older than 81 years of age. You are at high risk for heart disease. What should I know about cancer screening? Depending on your health history and family history, you may need to have cancer screening at various ages. This may  include screening for: Breast cancer. Cervical cancer. Colorectal cancer. Skin cancer. Lung cancer. What should I know about heart disease, diabetes, and high blood pressure? Blood pressure and heart disease High blood pressure causes heart disease and increases the risk of stroke. This is more likely to develop in people who have high blood pressure readings or are overweight. Have your blood pressure checked: Every 3-5 years if you are 67-38 years of age. Every year if you are 51 years old or older. Diabetes Have regular diabetes screenings. This checks your fasting blood sugar level. Have the screening done: Once every three years after age 37 if you are at a normal weight and have a low risk for diabetes. More often and at a younger age if you are overweight or have a high risk for diabetes. What should I know about preventing infection? Hepatitis B If you have a higher risk for hepatitis B, you should be screened for this virus. Talk with your health care provider to find out if you are at risk for hepatitis B infection. Hepatitis C Testing is recommended for: Everyone born from [birth year] through 1964. Anyone with known risk factors for hepatitis C. Sexually transmitted infections (STIs) Get screened for STIs, including gonorrhea and chlamydia, if: You are sexually active and are younger than 81 years of age. You are older than 81 years of age and your health care provider tells you that you are at risk for this type of infection. Your sexual activity has changed since you were last screened, and  you are at increased risk for chlamydia or gonorrhea. Ask your health care provider if you are at risk. Ask your health care provider about whether you are at high risk for HIV. Your health care provider may recommend a prescription medicine to help prevent HIV infection. If you choose to take medicine to prevent HIV, you should first get tested for HIV. You should then be tested every 3 months  for as long as you are taking the medicine. Pregnancy If you are about to stop having your period (premenopausal) and you may become pregnant, seek counseling before you get pregnant. Take 400 to 800 micrograms (mcg) of folic acid every day if you become pregnant. Ask for birth control (contraception) if you want to prevent pregnancy. Osteoporosis and menopause Osteoporosis is a disease in which the bones lose minerals and strength with aging. This can result in bone fractures. If you are 66 years old or older, or if you are at risk for osteoporosis and fractures, ask your health care provider if you should: Be screened for bone loss. Take a calcium or vitamin D supplement to lower your risk of fractures. Be given hormone replacement therapy (HRT) to treat symptoms of menopause. Follow these instructions at home: Alcohol use Do not drink alcohol if: Your health care provider tells you not to drink. You are pregnant, may be pregnant, or are planning to become pregnant. If you drink alcohol: Limit how much you have to: 0-1 drink a day. Know how much alcohol is in your drink. In the [place] one drink equals one 12 oz bottle of beer (355 mL), one 5 oz glass of wine (148 mL), or one 1 oz glass of hard liquor (44 mL). Lifestyle Do not use any products that contain nicotine or tobacco. These products include cigarettes, chewing tobacco, and vaping devices, such as e-cigarettes. If you need help quitting, ask your health care provider. Do not use street drugs. Do not share needles. Ask your health care provider for help if you need support or information about quitting drugs. General instructions Schedule regular health, dental, and eye exams. Stay current with your vaccines. Tell your health care provider if: You often feel depressed. You have ever been abused or do not feel safe at home. Summary Adopting a healthy lifestyle and getting preventive care are important in promoting health and  wellness. Follow your health care provider's instructions about healthy diet, exercising, and getting tested or screened for diseases. Follow your health care provider's instructions on monitoring your cholesterol and blood pressure. This information is not intended to replace advice given to you by your health care provider. Make sure you discuss any questions you have with your health care provider. Document Revised: 06/23/2020 Document Reviewed: 06/23/2020 Elsevier Patient Education  [place].

## 2022-04-07 NOTE — Progress Notes (Unsigned)
Subjective:    Patient ID: Patricia Mccullough, female    DOB: [birth year], 82 y.o.   MRN: [id number]      HPI Patricia Mccullough is here for a Physical exam.   Doing well overall.    Medications and allergies reviewed with patient and updated if appropriate.  Current Outpatient Medications on File Prior to Visit  Medication Sig Dispense Refill   amLODipine (NORVASC) 5 MG tablet TAKE 1 TABLET BY MOUTH ONCE DAILY . APPOINTMENT REQUIRED FOR FUTURE REFILLS 90 tablet 0   aspirin EC 325 MG tablet Take 1 tablet (325 mg total) by mouth daily. 100 tablet 3   atorvastatin (LIPITOR) 20 MG tablet Take 1 tablet (20 mg total) by mouth daily. 30 tablet 11   Continuous Blood Gluc Sensor (FREESTYLE LIBRE 2 SENSOR) MISC Use daily to check blood sugars levels 4x a day (14 day sensor) 2 each 6   Continuous Blood Gluc Sensor (FREESTYLE LIBRE SENSOR SYSTEM) MISC Use as directed to check sugars.  Dx  E11.9 with hyperglycemia 1 each 0   estradiol (ESTRING) 7.5 MCG/24HR vaginal ring Place 1 each vaginally every 3 (three) months. 1 each 3   fluticasone (FLONASE) 50 MCG/ACT nasal spray Place into both nostrils daily.     JARDIANCE 10 MG TABS tablet Take 1 tablet (10 mg total) by mouth every morning. 30 tablet 5   metFORMIN (GLUCOPHAGE) 1000 MG tablet TAKE 1 TABLET BY MOUTH TWICE DAILY WITH MEALS 180 tablet 0   SYSTANE BALANCE 0.6 % SOLN Place 1 drop into both eyes in the morning, at noon, in the evening, and at bedtime.     No current facility-administered medications on file prior to visit.    Review of Systems  Constitutional:  Negative for fever.  Eyes:  Negative for visual disturbance.  Respiratory:  Negative for cough, shortness of breath and wheezing.   Cardiovascular:  Negative for chest pain, palpitations and leg swelling.  Gastrointestinal:  Positive for constipation (controlled with senna). Negative for abdominal pain, blood in stool and diarrhea.       No gerd  Genitourinary:  Negative for dysuria.   Musculoskeletal:  Positive for arthralgias (sometimes). Negative for back pain.  Skin:  Negative for rash.  Neurological:  Positive for light-headedness (occ). Negative for headaches.  Psychiatric/Behavioral:  Negative for dysphoric mood. The patient is not nervous/anxious.        Objective:   Vitals:   04/08/22 1045  BP: 120/76  Pulse: 70  Temp: 98.7 F (37.1 C)  SpO2: 98%   Filed Weights   04/08/22 1045  Weight: 139 lb (63 kg)   Body mass index is 25.84 kg/m.  BP Readings from Last 3 Encounters:  04/08/22 120/76  04/05/22 131/63  03/31/22 (!) 142/89    Wt Readings from Last 3 Encounters:  04/08/22 139 lb (63 kg)  03/26/22 139 lb (63 kg)  12/29/21 144 lb (65.3 kg)       Physical Exam Constitutional: She appears well-developed and well-nourished. No distress.  HENT:  Head: Normocephalic and atraumatic.  Right Ear: External ear normal. Normal ear canal and TM Left Ear: External ear normal.  Normal ear canal and TM Mouth/Throat: Oropharynx is clear and moist.  Eyes: Conjunctivae normal.  Neck: Neck supple. No tracheal deviation present. No thyromegaly present.  No carotid bruit  Cardiovascular: Normal rate, regular rhythm and normal heart sounds.   No murmur heard.  No edema. Pulmonary/Chest: Effort normal and breath sounds normal. No respiratory distress.  She has no wheezes. She has no rales.  Breast: deferred   Abdominal: Soft. She exhibits no distension. There is no tenderness.  Lymphadenopathy: She has no cervical adenopathy.  Skin: Skin is warm and dry. She is not diaphoretic.  Psychiatric: She has a normal mood and affect. Her behavior is normal.     Lab Results  Component Value Date   WBC 10.0 11/26/2021   HGB 13.3 11/26/2021   HCT 39.0 11/26/2021   PLT 248 11/26/2021   GLUCOSE 163 (H) 11/27/2021   CHOL 172 11/27/2021   TRIG 112 11/27/2021   HDL 59 11/27/2021   LDLDIRECT 43.0 10/06/2021   LDLCALC 91 11/27/2021   ALT 12 10/06/2021   AST 13  10/06/2021   NA 139 11/27/2021   K 3.7 11/27/2021   CL 105 11/27/2021   CREATININE 0.92 11/27/2021   BUN 17 11/27/2021   CO2 25 11/27/2021   TSH 1.78 09/12/2020   HGBA1C 7.8 (H) 11/27/2021   MICROALBUR 7.5 (H) 10/06/2021         Assessment & Plan:   Physical exam: Screening blood work  ordered Exercise    some - can do more Weight   good for age Substance abuse  none Eating ok - she thinks she can do better - she is paying attention  Reviewed recommended immunizations.   Health Maintenance  Topic Date Due   Zoster Vaccines- Shingrix (1 of 2) Never done   FOOT EXAM  09/12/2021   DEXA SCAN  02/01/2022   Medicare Annual Wellness (AWV)  05/20/2022   COVID-19 Vaccine (3 - 2023-24 season) 04/24/2022 (Originally 10/16/2021)   INFLUENZA VACCINE  05/16/2022 (Originally 09/15/2021)   HEMOGLOBIN A1C  05/29/2022   Diabetic kidney evaluation - Urine ACR  10/07/2022   Diabetic kidney evaluation - eGFR measurement  11/28/2022   OPHTHALMOLOGY EXAM  12/04/2022   DTaP/Tdap/Td (4 - Td or Tdap) 11/29/2029   Pneumonia Vaccine 33+ Years old  Completed   HPV VACCINES  Aged Out          See Problem List for Assessment and Plan of chronic medical problems.

## 2022-04-08 ENCOUNTER — Encounter: Payer: Self-pay | Admitting: Internal Medicine

## 2022-04-08 ENCOUNTER — Ambulatory Visit (INDEPENDENT_AMBULATORY_CARE_PROVIDER_SITE_OTHER): Payer: Medicare Other | Admitting: Internal Medicine

## 2022-04-08 VITALS — BP 120/76 | HR 70 | Temp 98.7°F | Ht 61.5 in | Wt 139.0 lb

## 2022-04-08 DIAGNOSIS — I10 Essential (primary) hypertension: Secondary | ICD-10-CM | POA: Diagnosis not present

## 2022-04-08 DIAGNOSIS — E113599 Type 2 diabetes mellitus with proliferative diabetic retinopathy without macular edema, unspecified eye: Secondary | ICD-10-CM

## 2022-04-08 DIAGNOSIS — Z13820 Encounter for screening for osteoporosis: Secondary | ICD-10-CM

## 2022-04-08 DIAGNOSIS — Z0000 Encounter for general adult medical examination without abnormal findings: Secondary | ICD-10-CM

## 2022-04-08 DIAGNOSIS — E2839 Other primary ovarian failure: Secondary | ICD-10-CM

## 2022-04-08 DIAGNOSIS — E7849 Other hyperlipidemia: Secondary | ICD-10-CM

## 2022-04-08 DIAGNOSIS — Z8673 Personal history of transient ischemic attack (TIA), and cerebral infarction without residual deficits: Secondary | ICD-10-CM | POA: Diagnosis not present

## 2022-04-08 DIAGNOSIS — D239 Other benign neoplasm of skin, unspecified: Secondary | ICD-10-CM | POA: Insufficient documentation

## 2022-04-08 LAB — CBC WITH DIFFERENTIAL/PLATELET
Basophils Absolute: 0.1 10*3/uL (ref 0.0–0.1)
Basophils Relative: 0.7 % (ref 0.0–3.0)
Eosinophils Absolute: 0.5 10*3/uL (ref 0.0–0.7)
Eosinophils Relative: 6.4 % — ABNORMAL HIGH (ref 0.0–5.0)
HCT: 39.3 % (ref 36.0–46.0)
Hemoglobin: 13 g/dL (ref 12.0–15.0)
Lymphocytes Relative: 41.1 % (ref 12.0–46.0)
Lymphs Abs: 3.2 10*3/uL (ref 0.7–4.0)
MCHC: 33.1 g/dL (ref 30.0–36.0)
MCV: 94.2 fl (ref 78.0–100.0)
Monocytes Absolute: 0.5 10*3/uL (ref 0.1–1.0)
Monocytes Relative: 6.6 % (ref 3.0–12.0)
Neutro Abs: 3.6 10*3/uL (ref 1.4–7.7)
Neutrophils Relative %: 45.2 % (ref 43.0–77.0)
Platelets: 236 10*3/uL (ref 150.0–400.0)
RBC: 4.17 Mil/uL (ref 3.87–5.11)
RDW: 15.8 % — ABNORMAL HIGH (ref 11.5–15.5)
WBC: 7.9 10*3/uL (ref 4.0–10.5)

## 2022-04-08 LAB — COMPREHENSIVE METABOLIC PANEL
ALT: 32 U/L (ref 0–35)
AST: 18 U/L (ref 0–37)
Albumin: 4.3 g/dL (ref 3.5–5.2)
Alkaline Phosphatase: 177 U/L — ABNORMAL HIGH (ref 39–117)
BUN: 17 mg/dL (ref 6–23)
CO2: 31 mEq/L (ref 19–32)
Calcium: 10.4 mg/dL (ref 8.4–10.5)
Chloride: 103 mEq/L (ref 96–112)
Creatinine, Ser: 0.87 mg/dL (ref 0.40–1.20)
GFR: 62.86 mL/min (ref >60.00)
Glucose, Bld: 126 mg/dL — ABNORMAL HIGH (ref 70–99)
Potassium: 4.3 mEq/L (ref 3.5–5.1)
Sodium: 141 mEq/L (ref 135–145)
Total Bilirubin: 0.5 mg/dL (ref 0.2–1.2)
Total Protein: 8 g/dL (ref 6.0–8.3)

## 2022-04-08 LAB — LIPID PANEL
Cholesterol: 154 mg/dL (ref 0–200)
HDL: 76.8 mg/dL (ref >39.00)
LDL Cholesterol: 59 mg/dL (ref 0–99)
NonHDL: 76.8
Total CHOL/HDL Ratio: 2
Triglycerides: 89 mg/dL (ref 0.0–149.0)
VLDL: 17.8 mg/dL (ref 0.0–40.0)

## 2022-04-08 LAB — HEMOGLOBIN A1C: Hgb A1c MFr Bld: 8 % — ABNORMAL HIGH (ref 4.6–6.5)

## 2022-04-08 NOTE — Assessment & Plan Note (Addendum)
Chronic   Lab Results  Component Value Date   HGBA1C 7.8 (H) 11/27/2021   Sugars not ideally controlled Testing sugars 1 times a day Check A1c  Continue Jardiance 10 mg daily, metformin 1000 mg twice daily Stressed regular exercise, diabetic diet

## 2022-04-08 NOTE — Assessment & Plan Note (Signed)
Chronic °Blood pressure well controlled °CMP °Continue amlodipine 5 mg daily °

## 2022-04-08 NOTE — Assessment & Plan Note (Addendum)
Chronic History of TIA Continue 325 mg of aspirin daily Continue atorvastatin 20 mg daily Stressed good blood pressure, sugar control

## 2022-04-08 NOTE — Assessment & Plan Note (Signed)
Chronic Regular exercise and healthy diet encouraged Check lipid panel  Deferred statin

## 2022-04-11 ENCOUNTER — Other Ambulatory Visit: Payer: Self-pay | Admitting: Internal Medicine

## 2022-04-23 ENCOUNTER — Ambulatory Visit (INDEPENDENT_AMBULATORY_CARE_PROVIDER_SITE_OTHER): Payer: Medicare Other | Admitting: Obstetrics and Gynecology

## 2022-04-23 ENCOUNTER — Encounter: Payer: Self-pay | Admitting: Obstetrics and Gynecology

## 2022-04-23 VITALS — BP 102/56 | HR 74 | Wt 139.6 lb

## 2022-04-23 DIAGNOSIS — N812 Incomplete uterovaginal prolapse: Secondary | ICD-10-CM

## 2022-04-23 DIAGNOSIS — N393 Stress incontinence (female) (male): Secondary | ICD-10-CM | POA: Diagnosis not present

## 2022-04-23 DIAGNOSIS — N8110 Cystocele, unspecified: Secondary | ICD-10-CM

## 2022-04-23 DIAGNOSIS — N816 Rectocele: Secondary | ICD-10-CM

## 2022-04-23 NOTE — Progress Notes (Signed)
[facility] Return Visit  SUBJECTIVE  History of Present Illness: Patricia Mccullough is a 82 y.o. female seen in follow-up for incomplete bladder emptying and prolapse.   Previously had a #8 ring pessary and PVR at initial visit was 256m. She was changed to a #3 cube pessary. Estring was ordered but needed authorization (which did get approval). She has not been notified that the estring is available yet (has been on backorder). She feels that she is emptying her bladder better overall, especially in the last two weeks. She is happy with how the pessary has been working for her. Denies vaginal bleeding. She is not interested in surgery. Leakage has not been that bothersome for her.   Urodynamic Impression:  1. Sensation was normal; capacity was normal 2. Stress Incontinence was demonstrated at normal pressures; 3. Detrusor Overactivity was demonstrated without leakage. 4. Emptying was dysfunctional with a minimally elevated PVR (1753m, a sustained detrusor contraction present,  abdominal straining present at beginning of void and unable to be assessed for the rest, dyssynergic urethral sphincter activity on EMG.    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   Past Surgical History: She  has a past surgical history that includes Cataract extraction (12/16/2008); Vetrectomy (02/16/2007); and MM BREAST STEREO BIOPSY LEFT (ARMX HX) (Left, 11/2021).   Medications: She has a current medication list which includes the following prescription(s): amlodipine, aspirin ec, atorvastatin, freestyle libre 2 sensor, freestyle libre sensor system, estradiol, fluticasone, jardiance, metformin, and systane balance.   Allergies: Patient has No Known  Allergies.   Social History: Patient  reports that she has never smoked. She has never used smokeless tobacco. She reports that she does not drink alcohol and does not use drugs.      OBJECTIVE     Physical Exam: Vitals:   04/23/22 1250  BP: (!) 102/56  Pulse: 74  Weight: 139 lb 9.6 oz (63.3 kg)   Gen: No apparent distress, A&O x 3.  Detailed Urogynecologic Evaluation:  Deferred. Prior exam showed:  POP-Q   0                                            Aa   0                                           Ba   -4.5                                              C    5                                            Gh   4  Pb   11                                            tvl    -1                                            Ap   -1                                            Bp   -7.5                                              D        ASSESSMENT AND PLAN    Ms. Igleheart is a 82 y.o. with:  1. Prolapse of anterior vaginal wall   2. Prolapse of posterior vaginal wall   3. Uterovaginal prolapse, incomplete   4. SUI (stress urinary incontinence, female)     - Discussed results of urodynamics. Emptied bladder better during study- old pessary may have been obstructive. But will continue to monitor her symptoms.  - Will have her return when she gets the estring to have it placed then will do q3 month pessary cleanings. Explained to patient that she should call the pharmacy to enquire when the estring will be available and that she needs to bring it with her to her appointment.  - She also had questions about the estring and how at worked and she was shown a sample of the ring and discussed how it works.   Return 1 month for pessary and estring.   Jaquita Folds, MD   Time spent: I spent 20 minutes dedicated to the care of this patient on the date of this encounter to include pre-visit review of records, face-to-face time  with the patient and post visit documentation and ordering medication/ testing.

## 2022-04-23 NOTE — Patient Instructions (Signed)
Call the pharmacy to pick up the Estring and bring it with you to your next appointment.   2. Come to your appointment with the Estring and we will clean the pessary at the same time.   3. Then you can schedule appointments once every 3 months for pessary cleanings and you will need to bring a new Estring with you to each visit.

## 2022-05-04 ENCOUNTER — Ambulatory Visit (INDEPENDENT_AMBULATORY_CARE_PROVIDER_SITE_OTHER)
Admission: RE | Admit: 2022-05-04 | Discharge: 2022-05-04 | Disposition: A | Discharge status: Home / Self Care | Payer: Medicare Other | Source: Ambulatory Visit | Attending: Internal Medicine | Admitting: Internal Medicine

## 2022-05-04 ENCOUNTER — Other Ambulatory Visit: Payer: Medicare Other

## 2022-05-04 DIAGNOSIS — E2839 Other primary ovarian failure: Secondary | ICD-10-CM

## 2022-05-04 DIAGNOSIS — Z13820 Encounter for screening for osteoporosis: Secondary | ICD-10-CM

## 2022-05-05 ENCOUNTER — Inpatient Hospital Stay: Admission: RE | Admit: 2022-05-05 | Payer: Medicare Other | Source: Ambulatory Visit

## 2022-05-07 ENCOUNTER — Telehealth: Payer: Self-pay

## 2022-05-10 ENCOUNTER — Other Ambulatory Visit: Payer: Self-pay | Admitting: Internal Medicine

## 2022-05-11 NOTE — Telephone Encounter (Signed)
Erroneous entry

## 2022-05-20 NOTE — Progress Notes (Signed)
 Urogynecology   Subjective:     Chief Complaint:  Chief Complaint  Patient presents with   Pessary Check    Patricia Mccullough is a 82 y.o. female is here for pessary check.   History of Present Illness: Patricia Mccullough is a 82 y.o. female with stage II pelvic organ prolapse who presents for a pessary check. She is using a size #3 cube pessary. The pessary has been working well and she has no complaints. She is using vaginal estrogen. She denies vaginal bleeding.  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   Past Surgical History: She  has a past surgical history that includes Cataract extraction (12/16/2008); Vetrectomy (02/16/2007); and MM BREAST STEREO BIOPSY LEFT (ARMX HX) (Left, 11/2021).   Medications: She has a current medication list which includes the following prescription(s): amlodipine, aspirin ec, atorvastatin, freestyle libre 2 sensor, freestyle libre sensor system, estradiol, fluticasone, jardiance, metformin, and systane balance.   Allergies: Patient has No Known Allergies.   Social History: Patient  reports that she has never smoked. She has never used smokeless tobacco. She reports that she does not drink alcohol and does not use drugs.      Objective:    Physical Exam: BP (!) 146/74   Pulse 71  Gen: No apparent distress, A&O x 3. Detailed Urogynecologic Evaluation:  Pelvic Exam: Normal external female genitalia; Bartholin's and Skene's glands normal in appearance; urethral meatus normal in appearance, no urethral masses or discharge. The pessary was noted to be in place. It was removed and cleaned. Speculum exam revealed mild excoriation in the back wall of the vagina. The pessary was replaced along  with E-string. It was comfortable to the patient and fit well.     Assessment/Plan:    Assessment: Patricia Mccullough is a 82 y.o. with stage II pelvic organ prolapse here for a pessary check. She is doing well.  Plan: She will keep the pessary in place until next visit. She will have E-string in place behind pessary. She will follow-up in 3 months for a pessary check or sooner as needed.  All questions were answered.

## 2022-05-21 ENCOUNTER — Encounter: Payer: Self-pay | Admitting: Obstetrics and Gynecology

## 2022-05-21 ENCOUNTER — Ambulatory Visit (INDEPENDENT_AMBULATORY_CARE_PROVIDER_SITE_OTHER): Payer: Medicare Other | Admitting: Obstetrics and Gynecology

## 2022-05-21 VITALS — BP 146/74 | HR 71

## 2022-05-21 DIAGNOSIS — N812 Incomplete uterovaginal prolapse: Secondary | ICD-10-CM

## 2022-05-21 DIAGNOSIS — N952 Postmenopausal atrophic vaginitis: Secondary | ICD-10-CM | POA: Diagnosis not present

## 2022-05-21 DIAGNOSIS — Z4689 Encounter for fitting and adjustment of other specified devices: Secondary | ICD-10-CM

## 2022-05-21 NOTE — Progress Notes (Unsigned)
Received call from answering service - states issue she had previously contacted office about has resolved and does not need return call

## 2022-05-21 NOTE — Patient Instructions (Signed)
When you come for your pessary cleaning and change please bring the new E-string at that appointment. We will change it once every 3 months.

## 2022-05-24 ENCOUNTER — Telehealth: Payer: Self-pay | Admitting: Obstetrics and Gynecology

## 2022-05-24 NOTE — Telephone Encounter (Signed)
Patient spoke to [place] this morning saying her pessary came out and needed to come back in to place it again.  OV set up for this Wed.  She will bring pessary.  Says Estring still in place.

## 2022-05-25 ENCOUNTER — Inpatient Hospital Stay: Admission: RE | Admit: 2022-05-25 | Payer: Medicare Other | Source: Ambulatory Visit

## 2022-05-26 ENCOUNTER — Encounter: Payer: Self-pay | Admitting: Obstetrics and Gynecology

## 2022-05-26 ENCOUNTER — Ambulatory Visit (INDEPENDENT_AMBULATORY_CARE_PROVIDER_SITE_OTHER): Payer: Medicare Other | Admitting: Obstetrics and Gynecology

## 2022-05-26 VITALS — BP 130/68 | HR 76

## 2022-05-26 DIAGNOSIS — N812 Incomplete uterovaginal prolapse: Secondary | ICD-10-CM | POA: Diagnosis not present

## 2022-05-26 DIAGNOSIS — Z4689 Encounter for fitting and adjustment of other specified devices: Secondary | ICD-10-CM

## 2022-05-26 NOTE — Progress Notes (Signed)
[facility]   Subjective:     Chief Complaint:  Chief Complaint  Patient presents with   Pessary issues    Patricia Mccullough is a 82 y.o. female is here due to pessary falling out.   History of Present Illness: Patricia Mccullough is a 82 y.o. female with stage II pelvic organ prolapse who presents for a pessary check. She is using a size #3 cube pessary with an estring, last cleaned 4/5 but pessary fell out a few days after when she was straining with BM. We discussed increasing size of pessary but she wants the same one replaced at this time since it worked well for her for a month.   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   Past Surgical History: She  has a past surgical history that includes Cataract extraction (12/16/2008); Vetrectomy (02/16/2007); and MM BREAST STEREO BIOPSY LEFT (ARMX HX) (Left, 11/2021).   Medications: She has a current medication list which includes the following prescription(s): amlodipine, aspirin ec, atorvastatin, freestyle libre 2 sensor, freestyle libre sensor system, estradiol, fluticasone, jardiance, metformin, and systane balance.   Allergies: Patient has No Known Allergies.   Social History: Patient  reports that she has never smoked. She has never used smokeless tobacco. She reports that she does not drink alcohol and does not use drugs.      Objective:    Physical Exam: BP 130/68   Pulse 76  Gen: No apparent distress, A&O x 3. Detailed Urogynecologic Evaluation:  Pelvic Exam: Normal external female genitalia; Bartholin's and Skene's glands normal in appearance; urethral meatus normal in appearance, no urethral masses or discharge. Speculum exam revealed no lesions in the vagina. Estring  was in place. The pessary was replaced. It was comfortable to the patient and fit well.   POP-Q (03/26/22)   0                                            Aa   0                                           Ba   -4.5                                              C    5                                            Gh   4                                            Pb   11  tvl    -1                                            Ap   -1                                            Bp   -7.5                                              D         Assessment/Plan:    Assessment: Patricia Mccullough is a 82 y.o. with stage II pelvic organ prolapse here for a pessary check. She is doing well.  Plan: - #3 cube pessary replaced.  - Bring new estring to next pessary check in July  Marguerita Beards, MD   Time spent: I spent 17 minutes dedicated to the care of this patient on the date of this encounter to include pre-visit review of records, face-to-face time with the patient and post visit documentation.

## 2022-06-10 ENCOUNTER — Telehealth: Payer: Self-pay | Admitting: Internal Medicine

## 2022-06-10 NOTE — Telephone Encounter (Signed)
Called patient to schedule Medicare Annual Wellness Visit (AWV). Left message for patient to call back and schedule Medicare Annual Wellness Visit (AWV).  Last date of AWV: 05/19/2021  Please schedule an appointment at any time with [facility].  If any questions, please contact me at [phone].  Thank you ,  Randon Goldsmith Care Guide [facility] AWV TEAM Direct Dial: [phone]

## 2022-06-11 ENCOUNTER — Other Ambulatory Visit: Payer: Self-pay | Admitting: Internal Medicine

## 2022-06-23 ENCOUNTER — Ambulatory Visit (INDEPENDENT_AMBULATORY_CARE_PROVIDER_SITE_OTHER): Payer: Medicare Other | Admitting: Podiatry

## 2022-06-23 ENCOUNTER — Encounter: Payer: Self-pay | Admitting: Podiatry

## 2022-06-23 DIAGNOSIS — E1159 Type 2 diabetes mellitus with other circulatory complications: Secondary | ICD-10-CM | POA: Diagnosis not present

## 2022-06-23 NOTE — Progress Notes (Signed)
This patient presents to the office for diabetic foot exam.   This patient says there is no pain or discomfort in her feet.  No history of infection or drainage.  This patient presents to the office for foot exam due to having a history of diabetes.  Vascular  Dorsalis pedis and posterior tibial pulses are diminished/absent  palpable  B/L.  Capillary return  WNL.  Cold feet .  Skin turgor  WNL  Sensorium  Senn Weinstein monofilament wire  WNL. Normal tactile sensation.  Nail Exam  Patient has normal nails with no evidence of bacterial or fungal infection.  Orthopedic  Exam  Muscle tone and muscle strength  WNL.  No limitations of motion feet  B/L.  No crepitus or joint effusion noted.  Foot type is unremarkable and digits show no abnormalities.  Bony prominences are unremarkable.  Skin  No open lesions.  Normal skin texture and turgor.   Diabetes with no complications  Diabetic foot exam was performed.  There is evidence of vascular  pathology. I discussed this with this patient and we will order vascular studies for this patient.  LOPS  WNL   RTC  1 year   Helane Gunther DPM

## 2022-07-20 ENCOUNTER — Ambulatory Visit
Admission: RE | Admit: 2022-07-20 | Discharge: 2022-07-20 | Disposition: A | Discharge status: Home / Self Care | Payer: Medicare Other | Source: Ambulatory Visit | Attending: Internal Medicine | Admitting: Internal Medicine

## 2022-07-20 DIAGNOSIS — Z1231 Encounter for screening mammogram for malignant neoplasm of breast: Secondary | ICD-10-CM

## 2022-08-02 ENCOUNTER — Ambulatory Visit (INDEPENDENT_AMBULATORY_CARE_PROVIDER_SITE_OTHER): Payer: Medicare Other

## 2022-08-02 VITALS — Ht 61.5 in | Wt 140.0 lb

## 2022-08-02 DIAGNOSIS — Z0000 Encounter for general adult medical examination without abnormal findings: Secondary | ICD-10-CM | POA: Diagnosis not present

## 2022-08-02 NOTE — Progress Notes (Signed)
I connected with  Windell Moment on 08/02/22 by a audio enabled telemedicine application and verified that I am speaking with the correct person using two identifiers.  Patient Location: Home  Provider Location: Office/Clinic  I discussed the limitations of evaluation and management by telemedicine. The patient expressed understanding and agreed to proceed.  Subjective:   Patricia Mccullough is a 81 y.o. female who presents for Medicare Annual (Subsequent) preventive examination.  Review of Systems     Cardiac Risk Factors include: advanced age (>24men, >8 women);diabetes mellitus;dyslipidemia;family history of premature cardiovascular disease;hypertension     Objective:    Today's Vitals   08/02/22 1103  Weight: 140 lb (63.5 kg)  Height: 5' 1.5" (1.562 m)  PainSc: 0-No pain   Body mass index is 26.02 kg/m.     08/02/2022   11:14 AM 11/26/2021    6:29 PM 05/19/2021    2:21 PM 08/24/2020    3:34 PM 08/21/2020    2:22 PM 08/23/2019    1:54 PM 01/19/2018   10:39 AM  Advanced Directives  Does Patient Have a Medical Advance Directive? No No No No No No No  Does patient want to make changes to medical advance directive?       Yes (ED - Information included in AVS)  Would patient like information on creating a medical advance directive? No - Patient declined No - Patient declined No - Patient declined No - Patient declined No - Patient declined No - Patient declined     Current Medications (verified) Outpatient Encounter Medications as of 08/02/2022  Medication Sig   amLODipine (NORVASC) 5 MG tablet TAKE 1 TABLET BY MOUTH ONCE DAILY NEED  OFFICE  VISIT   aspirin EC 325 MG tablet Take 1 tablet (325 mg total) by mouth daily.   atorvastatin (LIPITOR) 20 MG tablet Take 1 tablet (20 mg total) by mouth daily.   Continuous Blood Gluc Sensor (FREESTYLE LIBRE 2 SENSOR) MISC Use daily to check blood sugars levels 4x a day (14 day sensor)   Continuous Blood Gluc Sensor (FREESTYLE  LIBRE SENSOR SYSTEM) MISC Use as directed to check sugars.  Dx  E11.9 with hyperglycemia   estradiol (ESTRING) 7.5 MCG/24HR vaginal ring Place 1 each vaginally every 3 (three) months.   fluticasone (FLONASE) 50 MCG/ACT nasal spray Place into both nostrils daily.   JARDIANCE 10 MG TABS tablet TAKE 1 TABLET BY MOUTH ONCE DAILY IN THE MORNING   metFORMIN (GLUCOPHAGE) 1000 MG tablet TAKE 1 TABLET BY MOUTH TWICE DAILY WITH MEALS   SYSTANE BALANCE 0.6 % SOLN Place 1 drop into both eyes in the morning, at noon, in the evening, and at bedtime.   No facility-administered encounter medications on file as of 08/02/2022.    Allergies (verified) Patient has no known allergies.   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   Thyroid nodule 05/15/2013   Right side, IR bx 5/15: benign follicular nodule   TIA (transient ischemic attack)    Past Surgical History:  Procedure Laterality Date   BREAST BIOPSY Left 12/18/2021   FIBROCYSTIC CHANGES   CATARACT EXTRACTION  12/16/2008   MM BREAST STEREO BIOPSY LEFT ([facility] HX) Left 11/2021   Vetrectomy  02/16/2007   Family History  Problem Relation Age of  Onset   Breast cancer Mother    Stroke Sister    Heart Problems Sister    Social History   Socioeconomic History   Marital status: Married    Spouse name: Not on file   Number of children: 3   Years of education: Not on file   Highest education level: Not on file  Occupational History   Not on file  Tobacco Use   Smoking status: Never   Smokeless tobacco: Never  Vaping Use   Vaping Use: Never used  Substance and Sexual Activity   Alcohol use: No   Drug use: No   Sexual activity: Not Currently    Partners: Male    Comment: 1st intercourse 82 yo-Fewer than 5 partners  Other Topics  Concern   Not on file  Social History Narrative   Married, lives with spouse and their grown son. 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   Social Determinants of Health   Financial Resource Strain: Low Risk  (08/02/2022)   Overall Financial Resource Strain (CARDIA)    Difficulty of Paying Living Expenses: Not hard at all  Food Insecurity: No Food Insecurity (08/02/2022)   Hunger Vital Sign    Worried About Running Out of Food in the Last Year: Never true    Ran Out of Food in the Last Year: Never true  Transportation Needs: No Transportation Needs (08/02/2022)   PRAPARE - Administrator, Civil Service (Medical): No    Lack of Transportation (Non-Medical): No  Physical Activity: Sufficiently Active (08/02/2022)   Exercise Vital Sign    Days of Exercise per Week: 5 days    Minutes of Exercise per Session: 30 min  Stress: No Stress Concern Present (08/02/2022)   [employer] of Occupational Health - Occupational Stress Questionnaire    Feeling of Stress : Not at all  Social Connections: Socially Integrated (08/02/2022)   Social Connection and Isolation Panel [NHANES]    Frequency of Communication with Friends and Family: More than three times a week    Frequency of Social Gatherings with Friends and Family: More than three times a week    Attends Religious Services: More than 4 times per year    Active Member of [employer] or Organizations: Yes    Attends Engineer, structural: More than 4 times per year    Marital Status: Married    Tobacco Counseling Counseling given: Not Answered   Clinical Intake:  Pre-visit preparation completed: Yes  Pain : No/denies pain Pain Score: 0-No pain     BMI - recorded: 26.02 Nutritional Status: BMI 25 -29 Overweight Nutritional Risks: None Diabetes: Yes CBG done?: No Did pt. bring in CBG monitor from home?: No  How often do you  need to have someone help you when you read instructions, pamphlets, or other written materials from your doctor or pharmacy?: 1 - Never What is the last grade level you completed in school?: Ph.D in Physic  Nutrition Risk Assessment:  Has the patient had any N/V/D within the last 2 months?  No  Does the patient have any non-healing wounds?  No  Has the patient had any unintentional weight loss or weight gain?  No   Diabetes:  Is the patient diabetic?  Yes  If diabetic, was a CBG obtained today?  No  Did the patient bring in their glucometer from home?  No  How often  do you monitor your CBG's? Freestyle Libre.   Financial Strains and Diabetes Management:  Are you having any financial strains with the device, your supplies or your medication? No .  Does the patient want to be seen by Chronic Care Management for management of their diabetes?  No  Would the patient like to be referred to a Nutritionist or for Diabetic Management?  No   Diabetic Exams:  Diabetic Eye Exam: Completed 12/03/2021 Diabetic Foot Exam: Completed 06/23/2022   Interpreter Needed?: No  Information entered by :: Niani Mourer N. Kong Packett, LPN.   Activities of Daily Living    08/02/2022   11:11 AM 11/27/2021    2:15 AM  In your present state of health, do you have any difficulty performing the following activities:  Hearing? 0 0  Vision? 0 0  Difficulty concentrating or making decisions? 0 1  Comment  sometimes  Walking or climbing stairs? 0 0  Dressing or bathing? 0 0  Doing errands, shopping? 0 0  Preparing Food and eating ? N   Using the Toilet? N   In the past six months, have you accidently leaked urine? N   Do you have problems with loss of bowel control? N   Managing your Medications? N   Managing your Finances? N   Housekeeping or managing your Housekeeping? N     Patient Care Team: Pincus Sanes, MD as PCP - General (Internal Medicine) Mortimer Fries, MD (Ophthalmology) Romero Belling, MD  (Inactive) (Endocrinology) Suzanna Obey, MD (Otolaryngology)  Indicate any recent Medical Services you may have received from other than [facility] providers in the past year (date may be approximate).     Assessment:   This is a routine wellness examination for Patricia Mccullough.  Hearing/Vision screen Hearing Screening - Comments:: Denies hearing difficulties   Vision Screening - Comments:: Wears rx glasses - up to date with routine eye exams with Emi Holes, MD.   Dietary issues and exercise activities discussed: Current Exercise Habits: Home exercise routine, Type of exercise: walking;treadmill, Time (Minutes): 30, Frequency (Times/Week): 6, Weekly Exercise (Minutes/Week): 180, Intensity: Moderate, Exercise limited by: None identified   Goals Addressed               This Visit's Progress     Client will verbalize knowledge of diabetes self-management as evidenced by Hgb A1C <7 or as defined by provider. (pt-stated)        To continue wearing my continuous glucose monitor, eating a healthy diet and staying physically active.      Depression Screen    08/02/2022   11:09 AM 04/08/2022   10:45 AM 10/06/2021   10:47 AM 05/19/2021    2:25 PM 03/24/2021    2:13 PM 09/12/2020   11:26 AM 02/23/2019    3:17 PM  PHQ 2/9 Scores  PHQ - 2 Score 0 0 0 0 1 0 0  PHQ- 9 Score 0     2     Fall Risk    08/02/2022   11:10 AM 04/08/2022   10:45 AM 10/06/2021   10:46 AM 05/19/2021    2:22 PM 03/24/2021    2:14 PM  Fall Risk   Falls in the past year? 0 0 0 1 1  Number falls in past yr: 0 0 0 0 0  Injury with Fall? 0 0 0 0 0  Risk for fall due to : No Fall Risks No Fall Risks No Fall Risks    Follow up Falls prevention discussed  Falls evaluation completed Falls evaluation completed Falls prevention discussed     FALL RISK PREVENTION PERTAINING TO THE HOME:  Any stairs in or around the home? Yes  If so, are there any without handrails? No  Home free of loose throw rugs in walkways, pet beds, electrical  cords, etc? Yes  Adequate lighting in your home to reduce risk of falls? Yes   ASSISTIVE DEVICES UTILIZED TO PREVENT FALLS:  Life alert? No  Use of a cane, walker or w/c? No  Grab bars in the bathroom? Yes  Shower chair or bench in shower? Yes  Elevated toilet seat or a handicapped toilet? No   TIMED UP AND GO:  Was the test performed? No . Telephonic Visit  Cognitive Function:    01/18/2017   10:08 AM  MMSE - Mini Mental State Exam  Orientation to time 5  Orientation to Place 5  Registration 3  Attention/ Calculation 5  Recall 2  Language- name 2 objects 2  Language- repeat 1  Language- follow 3 step command 3  Language- read & follow direction 1  Write a sentence 1  Copy design 1  Total score 29        08/02/2022   11:11 AM 05/19/2021    2:32 PM  6CIT Screen  What Year? 0 points 0 points  What month? 0 points 0 points  What time? 0 points 0 points  Count back from 20 0 points 0 points  Months in reverse 0 points 0 points  Repeat phrase 0 points 0 points  Total Score 0 points 0 points    Immunizations Immunization History  Administered Date(s) Administered   Hepatitis A 02/19/2012   Hepatitis B 02/19/2012   IPV 02/19/2012   Influenza Split 12/28/2011   Influenza, High Dose Seasonal PF 01/04/2013, 11/05/2013, 12/03/2015, 01/19/2018   Influenza,inj,Quad PF,6+ Mos 11/26/2014, 11/16/2018   Influenza-Unspecified 11/15/2016   Meningococcal polysaccharide vaccine (MPSV4) 02/19/2012   PFIZER(Purple Top)SARS-COV-2 Vaccination 09/01/2019, 09/25/2019   Pneumococcal Conjugate-13 05/07/2014   Pneumococcal Polysaccharide-23 12/28/2011   Td 02/16/2003, 11/05/2013   Tdap 11/30/2019   Typhoid Parenteral 02/19/2012    TDAP status: Up to date  Flu Vaccine status: Due, Education has been provided regarding the importance of this vaccine. Advised may receive this vaccine at local pharmacy or Health Dept. Aware to provide a copy of the vaccination record if obtained from  local pharmacy or Health Dept. Verbalized acceptance and understanding.  Pneumococcal vaccine status: Up to date  Covid-19 vaccine status: Completed vaccines  Qualifies for Shingles Vaccine? Yes   Zostavax completed No   Shingrix Completed?: No.    Education has been provided regarding the importance of this vaccine. Patient has been advised to call insurance company to determine out of pocket expense if they have not yet received this vaccine. Advised may also receive vaccine at local pharmacy or Health Dept. Verbalized acceptance and understanding.  Screening Tests Health Maintenance  Topic Date Due   Zoster Vaccines- Shingrix (1 of 2) Never done   COVID-19 Vaccine (3 - 2023-24 season) 10/16/2021   INFLUENZA VACCINE  09/16/2022   Diabetic kidney evaluation - Urine ACR  10/07/2022   HEMOGLOBIN A1C  10/07/2022   OPHTHALMOLOGY EXAM  12/04/2022   Diabetic kidney evaluation - eGFR measurement  04/09/2023   FOOT EXAM  06/23/2023   Medicare Annual Wellness (AWV)  08/02/2023   DEXA SCAN  05/04/2027   DTaP/Tdap/Td (4 - Td or Tdap) 11/29/2029   Pneumonia Vaccine 69+ Years old  Completed   HPV VACCINES  Aged Out    Health Maintenance  Health Maintenance Due  Topic Date Due   Zoster Vaccines- Shingrix (1 of 2) Never done   COVID-19 Vaccine (3 - 2023-24 season) 10/16/2021    Colorectal cancer screening: No longer required.   Mammogram status: Completed 07/20/2022. Repeat every year  Bone Density status: Completed 05/04/2022. Results reflect: Bone density results: NORMAL. Repeat every 5 years.  Lung Cancer Screening: (Low Dose CT Chest recommended if Age 60-80 years, 30 pack-year currently smoking OR have quit w/in 15years.) does not qualify.   Lung Cancer Screening Referral: no  Additional Screening:  Hepatitis C Screening: does not qualify; Completed: no  Vision Screening: Recommended annual ophthalmology exams for early detection of glaucoma and other disorders of the eye. Is  the patient up to date with their annual eye exam?  Yes  Who is the provider or what is the name of the office in which the patient attends annual eye exams? Emi Holes, MD at [facility] If pt is not established with a provider, would they like to be referred to a provider to establish care? No .   Dental Screening: Recommended annual dental exams for proper oral hygiene  Community Resource Referral / Chronic Care Management: CRR required this visit?  No   CCM required this visit?  No      Plan:     I have personally reviewed and noted the following in the patient's chart:   Medical and social history Use of alcohol, tobacco or illicit drugs  Current medications and supplements including opioid prescriptions. Patient is not currently taking opioid prescriptions. Functional ability and status Nutritional status Physical activity Advanced directives List of other physicians Hospitalizations, surgeries, and ER visits in previous 12 months Vitals Screenings to include cognitive, depression, and falls Referrals and appointments  In addition, I have reviewed and discussed with patient certain preventive protocols, quality metrics, and best practice recommendations. A written personalized care plan for preventive services as well as general preventive health recommendations were provided to patient.     Mickeal Needy, LPN   [id number]/61/0960   Nurse Notes: Normal cognitive status assessed by direct observation via telephone conversation by this Nurse Health Advisor. No abnormalities found.

## 2022-08-02 NOTE — Patient Instructions (Addendum)
Patricia Mccullough , Thank you for taking time to come for your Medicare Wellness Visit. I appreciate your ongoing commitment to your health goals. Please review the following plan we discussed and let me know if I can assist you in the future.   These are the goals we discussed:  Goals       Client will verbalize knowledge of diabetes self-management as evidenced by Hgb A1C <7 or as defined by provider. (pt-stated)      To continue wearing my continuous glucose monitor, eating a healthy diet and staying physically active.        This is a list of the screening recommended for you and due dates:  Health Maintenance  Topic Date Due   Zoster (Shingles) Vaccine (1 of 2) Never done   COVID-19 Vaccine (3 - 2023-24 season) 10/16/2021   Flu Shot  09/16/2022   Yearly kidney health urinalysis for diabetes  10/07/2022   Hemoglobin A1C  10/07/2022   Eye exam for diabetics  12/04/2022   Yearly kidney function blood test for diabetes  04/09/2023   Complete foot exam   06/23/2023   Medicare Annual Wellness Visit  08/02/2023   DEXA scan (bone density measurement)  05/04/2027   DTaP/Tdap/Td vaccine (4 - Td or Tdap) 11/29/2029   Pneumonia Vaccine  Completed   HPV Vaccine  Aged Out    Advanced directives: No; Advance directive discussed with you today. Even though you declined this today please call our office should you change your mind and we can give you the proper paperwork for you to fill out.  Conditions/risks identified: Yes; Type II Diabetes  Next appointment: 08/08/2023 at 11:15 a.m. telephone visit with Percell Miller, Nurse Health Advisor.  If you need to reschedule or cancel, please call [phone].  Preventive Care 81 Years and Older, Female Preventive care refers to lifestyle choices and visits with your health care provider that can promote health and wellness. What does preventive care include? A yearly physical exam. This is also called an annual well check. Dental exams once or twice a  year. Routine eye exams. Ask your health care provider how often you should have your eyes checked. Personal lifestyle choices, including: Daily care of your teeth and gums. Regular physical activity. Eating a healthy diet. Avoiding tobacco and drug use. Limiting alcohol use. Practicing safe sex. Taking low-dose aspirin every day. Taking vitamin and mineral supplements as recommended by your health care provider. What happens during an annual well check? The services and screenings done by your health care provider during your annual well check will depend on your age, overall health, lifestyle risk factors, and family history of disease. Counseling  Your health care provider may ask you questions about your: Alcohol use. Tobacco use. Drug use. Emotional well-being. Home and relationship well-being. Sexual activity. Eating habits. History of falls. Memory and ability to understand (cognition). Work and work Astronomer. Reproductive health. Screening  You may have the following tests or measurements: Height, weight, and BMI. Blood pressure. Lipid and cholesterol levels. These may be checked every 5 years, or more frequently if you are over 19 years old. Skin check. Lung cancer screening. You may have this screening every year starting at age 81 if you have a 30-pack-year history of smoking and currently smoke or have quit within the past 15 years. Fecal occult blood test (FOBT) of the stool. You may have this test every year starting at age 81. Flexible sigmoidoscopy or colonoscopy. You may have a sigmoidoscopy every  5 years or a colonoscopy every 10 years starting at age 81. Hepatitis C blood test. Hepatitis B blood test. Sexually transmitted disease (STD) testing. Diabetes screening. This is done by checking your blood sugar (glucose) after you have not eaten for a while (fasting). You may have this done every 1-3 years. Bone density scan. This is done to screen for  osteoporosis. You may have this done starting at age 81. Mammogram. This may be done every 1-2 years. Talk to your health care provider about how often you should have regular mammograms. Talk with your health care provider about your test results, treatment options, and if necessary, the need for more tests. Vaccines  Your health care provider may recommend certain vaccines, such as: Influenza vaccine. This is recommended every year. Tetanus, diphtheria, and acellular pertussis (Tdap, Td) vaccine. You may need a Td booster every 10 years. Zoster vaccine. You may need this after age 81. Pneumococcal 13-valent conjugate (PCV13) vaccine. One dose is recommended after age 81. Pneumococcal polysaccharide (PPSV23) vaccine. One dose is recommended after age 81. Talk to your health care provider about which screenings and vaccines you need and how often you need them. This information is not intended to replace advice given to you by your health care provider. Make sure you discuss any questions you have with your health care provider. Document Released: 02/28/2015 Document Revised: 10/22/2015 Document Reviewed: 12/03/2014 Elsevier Interactive Patient Education  2017 [employer].  Fall Prevention in the Home Falls can cause injuries. They can happen to people of all ages. There are many things you can do to make your home safe and to help prevent falls. What can I do on the outside of my home? Regularly fix the edges of walkways and driveways and fix any cracks. Remove anything that might make you trip as you walk through a door, such as a raised step or threshold. Trim any bushes or trees on the path to your home. Use bright outdoor lighting. Clear any walking paths of anything that might make someone trip, such as rocks or tools. Regularly check to see if handrails are loose or broken. Make sure that both sides of any steps have handrails. Any raised decks and porches should have guardrails on  the edges. Have any leaves, snow, or ice cleared regularly. Use sand or salt on walking paths during winter. Clean up any spills in your garage right away. This includes oil or grease spills. What can I do in the bathroom? Use night lights. Install grab bars by the toilet and in the tub and shower. Do not use towel bars as grab bars. Use non-skid mats or decals in the tub or shower. If you need to sit down in the shower, use a plastic, non-slip stool. Keep the floor dry. Clean up any water that spills on the floor as soon as it happens. Remove soap buildup in the tub or shower regularly. Attach bath mats securely with double-sided non-slip rug tape. Do not have throw rugs and other things on the floor that can make you trip. What can I do in the bedroom? Use night lights. Make sure that you have a light by your bed that is easy to reach. Do not use any sheets or blankets that are too big for your bed. They should not hang down onto the floor. Have a firm chair that has side arms. You can use this for support while you get dressed. Do not have throw rugs and other things on  the floor that can make you trip. What can I do in the kitchen? Clean up any spills right away. Avoid walking on wet floors. Keep items that you use a lot in easy-to-reach places. If you need to reach something above you, use a strong step stool that has a grab bar. Keep electrical cords out of the way. Do not use floor polish or wax that makes floors slippery. If you must use wax, use non-skid floor wax. Do not have throw rugs and other things on the floor that can make you trip. What can I do with my stairs? Do not leave any items on the stairs. Make sure that there are handrails on both sides of the stairs and use them. Fix handrails that are broken or loose. Make sure that handrails are as long as the stairways. Check any carpeting to make sure that it is firmly attached to the stairs. Fix any carpet that is loose  or worn. Avoid having throw rugs at the top or bottom of the stairs. If you do have throw rugs, attach them to the floor with carpet tape. Make sure that you have a light switch at the top of the stairs and the bottom of the stairs. If you do not have them, ask someone to add them for you. What else can I do to help prevent falls? Wear shoes that: Do not have high heels. Have rubber bottoms. Are comfortable and fit you well. Are closed at the toe. Do not wear sandals. If you use a stepladder: Make sure that it is fully opened. Do not climb a closed stepladder. Make sure that both sides of the stepladder are locked into place. Ask someone to hold it for you, if possible. Clearly mark and make sure that you can see: Any grab bars or handrails. First and last steps. Where the edge of each step is. Use tools that help you move around (mobility aids) if they are needed. These include: Canes. Walkers. Scooters. Crutches. Turn on the lights when you go into a dark area. Replace any light bulbs as soon as they burn out. Set up your furniture so you have a clear path. Avoid moving your furniture around. If any of your floors are uneven, fix them. If there are any pets around you, be aware of where they are. Review your medicines with your doctor. Some medicines can make you feel dizzy. This can increase your chance of falling. Ask your doctor what other things that you can do to help prevent falls. This information is not intended to replace advice given to you by your health care provider. Make sure you discuss any questions you have with your health care provider. Document Released: 11/28/2008 Document Revised: 07/10/2015 Document Reviewed: 03/08/2014 Elsevier Interactive Patient Education  2017 [employer].

## 2022-08-16 ENCOUNTER — Ambulatory Visit (HOSPITAL_COMMUNITY): Payer: Medicare Other

## 2022-08-20 ENCOUNTER — Ambulatory Visit (INDEPENDENT_AMBULATORY_CARE_PROVIDER_SITE_OTHER): Payer: Medicare Other | Admitting: Obstetrics and Gynecology

## 2022-08-20 ENCOUNTER — Encounter: Payer: Self-pay | Admitting: Obstetrics and Gynecology

## 2022-08-20 VITALS — BP 129/74 | HR 73

## 2022-08-20 DIAGNOSIS — Z4689 Encounter for fitting and adjustment of other specified devices: Secondary | ICD-10-CM

## 2022-08-20 DIAGNOSIS — N8110 Cystocele, unspecified: Secondary | ICD-10-CM

## 2022-08-20 DIAGNOSIS — N812 Incomplete uterovaginal prolapse: Secondary | ICD-10-CM

## 2022-08-20 DIAGNOSIS — N816 Rectocele: Secondary | ICD-10-CM

## 2022-08-20 DIAGNOSIS — N952 Postmenopausal atrophic vaginitis: Secondary | ICD-10-CM | POA: Diagnosis not present

## 2022-08-20 NOTE — Progress Notes (Signed)
[facility]   Subjective:     Chief Complaint:  Chief Complaint  Patient presents with   Pessary Check    Patricia Mccullough is a 82 y.o. female is here for pessary check.   History of Present Illness: Patricia Mccullough is a 82 y.o. female with stage II pelvic organ prolapse who presents for a pessary check. She is using a size #3 cube pessary. The pessary has been working well and she has no complaints. She is using vaginal estrogen in the form of an Estring. She denies vaginal bleeding.  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   Past Surgical History: She  has a past surgical history that includes Cataract extraction (12/16/2008); Vetrectomy (02/16/2007); MM BREAST STEREO BIOPSY LEFT (ARMX HX) (Left, 11/2021); and Breast biopsy (Left, 12/18/2021).   Medications: She has a current medication list which includes the following prescription(s): amlodipine, aspirin ec, atorvastatin, freestyle libre 2 sensor, freestyle libre sensor system, estradiol, fluticasone, jardiance, metformin, and systane balance.   Allergies: Patient has No Known Allergies.   Social History: Patient  reports that she has never smoked. She has never used smokeless tobacco. She reports that she does not drink alcohol and does not use drugs.      Objective:    Physical Exam: BP 129/74   Pulse 73  Gen: No apparent distress, A&O x 3. Detailed Urogynecologic Evaluation:  Pelvic Exam: Normal external female genitalia; Bartholin's and Skene's glands normal in appearance; urethral meatus normal in appearance, no urethral masses or discharge. The pessary was noted to be in place. It was removed and cleaned. Speculum exam revealed erythema in the vagina, no  specific area of irritation but 2 small clots removed with speculum on exam. The pessary was replaced. It was comfortable to the patient and fit well.    Assessment/Plan:    Assessment: Ms. Pugsley is a 82 y.o. with stage II pelvic organ prolapse here for a pessary check. She is doing well.  Plan: She will keep the pessary in place until next visit. She will continue to use E-string for vaginal estrogen. She will follow-up in  3 months for a pessary check or sooner as needed.  All questions were answered

## 2022-08-20 NOTE — Patient Instructions (Signed)
Continue with the pessary. We will continue to clean this every 3 months and replace.

## 2022-08-24 ENCOUNTER — Ambulatory Visit (HOSPITAL_COMMUNITY)
Admission: RE | Admit: 2022-08-24 | Discharge: 2022-08-24 | Disposition: A | Discharge status: Home / Self Care | Payer: Medicare Other | Source: Ambulatory Visit | Attending: Podiatry | Admitting: Podiatry

## 2022-08-24 DIAGNOSIS — E1159 Type 2 diabetes mellitus with other circulatory complications: Secondary | ICD-10-CM | POA: Diagnosis not present

## 2022-08-24 LAB — VAS US PAD ABI
Left ABI: 1.26
Right ABI: 1.26

## 2022-08-25 ENCOUNTER — Telehealth: Payer: Self-pay | Admitting: *Deleted

## 2022-08-25 NOTE — Telephone Encounter (Signed)
Called patient and husband answered phone , gave normal results per physician, stated that he  would give her the message .

## 2022-09-15 ENCOUNTER — Encounter (INDEPENDENT_AMBULATORY_CARE_PROVIDER_SITE_OTHER): Payer: Self-pay

## 2022-09-22 ENCOUNTER — Other Ambulatory Visit: Payer: Self-pay | Admitting: Internal Medicine

## 2022-09-27 ENCOUNTER — Ambulatory Visit (INDEPENDENT_AMBULATORY_CARE_PROVIDER_SITE_OTHER): Payer: Medicare Other | Admitting: Obstetrics and Gynecology

## 2022-09-27 ENCOUNTER — Encounter: Payer: Self-pay | Admitting: Obstetrics and Gynecology

## 2022-09-27 VITALS — BP 108/60 | HR 81

## 2022-09-27 DIAGNOSIS — N952 Postmenopausal atrophic vaginitis: Secondary | ICD-10-CM

## 2022-09-27 DIAGNOSIS — Z4689 Encounter for fitting and adjustment of other specified devices: Secondary | ICD-10-CM

## 2022-09-27 DIAGNOSIS — N812 Incomplete uterovaginal prolapse: Secondary | ICD-10-CM | POA: Diagnosis not present

## 2022-09-27 DIAGNOSIS — N8110 Cystocele, unspecified: Secondary | ICD-10-CM

## 2022-09-27 DIAGNOSIS — N816 Rectocele: Secondary | ICD-10-CM

## 2022-09-27 NOTE — Progress Notes (Signed)
[facility]   Subjective:     Chief Complaint:  Chief Complaint  Patient presents with   Follow-up    Patricia Mccullough is a 82 y.o. female is here for pessary issues.   History of Present Illness: Patricia Mccullough is a 82 y.o. female with stage II pelvic organ prolapse who presents for a pessary check. She is using a size #3 cube pessary. The pessary has been working well and she has no complaints. She is using an E-string. She endorses some mild vaginal bleeding after an episode of constipation in which she was straining to empty her bowels and felt the pessary shift. 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   Past Surgical History: She  has a past surgical history that includes Cataract extraction (12/16/2008); Vetrectomy (02/16/2007); MM BREAST STEREO BIOPSY LEFT (ARMX HX) (Left, 11/2021); and Breast biopsy (Left, 12/18/2021).   Medications: She has a current medication list which includes the following prescription(s): amlodipine, aspirin ec, atorvastatin, freestyle libre 2 sensor, freestyle libre sensor system, estradiol, fluticasone, jardiance, metformin, and systane balance.   Allergies: Patient has No Known Allergies.   Social History: Patient  reports that she has never smoked. She has never used smokeless tobacco. She reports that she does not drink alcohol and does not use drugs.      Objective:    Physical Exam: BP 108/60   Pulse 81  Gen: No apparent distress, A&O x 3. Detailed Urogynecologic Evaluation:  Pelvic Exam: Normal external female genitalia; Bartholin's and Skene's glands normal in appearance; urethral meatus normal in appearance, no urethral masses or discharge. The  pessary was noted to be slightly dislodged. It was removed and cleaned. Speculum exam revealed erythema in the vagina. The pessary was replaced. It was comfortable to the patient and fit well.   Assessment/Plan:    Assessment: Patricia Mccullough is a 82 y.o. with stage II pelvic organ prolapse here for a pessary check. She is doing well.  Plan: She will keep the pessary in place until next visit. She will continue to use estrogen. She will follow-up in 3 months for a pessary check or sooner as needed.  All questions were answered.

## 2022-10-01 ENCOUNTER — Ambulatory Visit (INDEPENDENT_AMBULATORY_CARE_PROVIDER_SITE_OTHER): Payer: Medicare Other | Admitting: Obstetrics and Gynecology

## 2022-10-01 ENCOUNTER — Encounter: Payer: Self-pay | Admitting: Obstetrics and Gynecology

## 2022-10-01 DIAGNOSIS — N812 Incomplete uterovaginal prolapse: Secondary | ICD-10-CM

## 2022-10-01 DIAGNOSIS — Z4689 Encounter for fitting and adjustment of other specified devices: Secondary | ICD-10-CM

## 2022-10-01 NOTE — Progress Notes (Signed)
 [facility]   Subjective:     Chief Complaint:  No chief complaint on file.  History of Present Illness: Patricia Mccullough is a 82 y.o. female with stage II pelvic organ prolapse who presents for a pessary check. She is using a size #3 cube pessary. She had some constipation today and noted that the pessary came down to the opening. Noticed a hint of blood. She pushed it back up inside but was concerned.   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   Past Surgical History: She  has a past surgical history that includes Cataract extraction (12/16/2008); Vetrectomy (02/16/2007); MM BREAST STEREO BIOPSY LEFT (ARMX HX) (Left, 11/2021); and Breast biopsy (Left, 12/18/2021).   Medications: She has a current medication list which includes the following prescription(s): amlodipine, aspirin ec, atorvastatin, freestyle libre 2 sensor, freestyle libre sensor system, estradiol, fluticasone, jardiance, metformin, and systane balance.   Allergies: Patient has No Known Allergies.   Social History: Patient  reports that she has never smoked. She has never used smokeless tobacco. She reports that she does not drink alcohol and does not use drugs.      Objective:    Physical Exam: There were no vitals taken for this visit. Gen: No apparent distress, A&O x 3. Detailed Urogynecologic Evaluation:  Pelvic Exam: Normal external female genitalia; Bartholin's and Skene's glands normal in appearance; urethral meatus normal in appearance, no urethral masses or discharge. The pessary was noted to be in place. It was removed and cleaned. Speculum exam revealed small abrasions on the walls which were treated with silver nitrate. Estring in place. The  pessary was replaced. It was comfortable to the patient and fit well.   Assessment/Plan:    Assessment: Ms. Patricia Mccullough is a 82 y.o. with stage II pelvic organ prolapse here for a pessary check. She is doing well.  Plan:  -Continue pessary - Discussed that she should try to avoid straining. It is ok to push the pessary deeper inside if it does come to the opening.  - Recheck in 3 motnhs or sooner if needed  Marguerita Beards, MD

## 2022-10-04 ENCOUNTER — Other Ambulatory Visit: Payer: Self-pay | Admitting: Internal Medicine

## 2022-10-04 DIAGNOSIS — E113599 Type 2 diabetes mellitus with proliferative diabetic retinopathy without macular edema, unspecified eye: Secondary | ICD-10-CM

## 2022-10-06 NOTE — Progress Notes (Unsigned)
Subjective:    Patient ID: Patricia Mccullough, female    DOB: 04-Dec-1940, 82 y.o.   MRN: [id number]     HPI Patricia Mccullough is here for follow up of her chronic medical problems.  Sugars at home better. Using continuous monitor  -   Range 80-120 on average  She is exercising regularly.   She is active, uses [employer]  Medications and allergies reviewed with patient and updated if appropriate.  Current Outpatient Medications on File Prior to Visit  Medication Sig Dispense Refill   amLODipine (NORVASC) 5 MG tablet TAKE 1 TABLET BY MOUTH ONCE DAILY . APPOINTMENT REQUIRED FOR FUTURE REFILLS 90 tablet 0   aspirin EC 325 MG tablet Take 1 tablet (325 mg total) by mouth daily. 100 tablet 3   atorvastatin (LIPITOR) 20 MG tablet Take 1 tablet (20 mg total) by mouth daily. 30 tablet 11   Continuous Blood Gluc Sensor (FREESTYLE LIBRE SENSOR SYSTEM) MISC Use as directed to check sugars.  Dx  E11.9 with hyperglycemia 1 each 0   Continuous Glucose Sensor (FREESTYLE LIBRE 2 SENSOR) MISC USE TO CHECK BLOOD SUGAR LEVELS 4 TIMES DAILY 2 each 0   estradiol (ESTRING) 7.5 MCG/24HR vaginal ring Place 1 each vaginally every 3 (three) months. 1 each 3   fluticasone (FLONASE) 50 MCG/ACT nasal spray Place into both nostrils daily.     JARDIANCE 10 MG TABS tablet TAKE 1 TABLET BY MOUTH ONCE DAILY IN THE MORNING 30 tablet 10   metFORMIN (GLUCOPHAGE) 1000 MG tablet TAKE 1 TABLET BY MOUTH TWICE DAILY WITH MEALS 180 tablet 0   SYSTANE BALANCE 0.6 % SOLN Place 1 drop into both eyes in the morning, at noon, in the evening, and at bedtime.     No current facility-administered medications on file prior to visit.     Review of Systems  Constitutional:  Negative for fever.  Respiratory:  Negative for cough, shortness of breath and wheezing.   Cardiovascular:  Negative for chest pain, palpitations and leg swelling.  Neurological:  Negative for light-headedness and headaches.       Objective:   Vitals:   10/07/22  1050  BP: 122/60  Pulse: 74  Temp: 98 F (36.7 C)  SpO2: 97%   BP Readings from Last 3 Encounters:  10/07/22 122/60  09/27/22 108/60  08/20/22 129/74   Wt Readings from Last 3 Encounters:  10/07/22 135 lb (61.2 kg)  08/02/22 140 lb (63.5 kg)  04/23/22 139 lb 9.6 oz (63.3 kg)   Body mass index is 25.09 kg/m.    Physical Exam Constitutional:      General: She is not in acute distress.    Appearance: Normal appearance.  HENT:     Head: Normocephalic and atraumatic.  Eyes:     Conjunctiva/sclera: Conjunctivae normal.  Cardiovascular:     Rate and Rhythm: Normal rate and regular rhythm.     Heart sounds: Normal heart sounds.  Pulmonary:     Effort: Pulmonary effort is normal. No respiratory distress.     Breath sounds: Normal breath sounds. No wheezing.  Musculoskeletal:     Cervical back: Neck supple.     Right lower leg: No edema.     Left lower leg: No edema.  Lymphadenopathy:     Cervical: No cervical adenopathy.  Skin:    General: Skin is warm and dry.     Findings: No rash.  Neurological:     Mental Status: She is alert. Mental status  is at baseline.  Psychiatric:        Mood and Affect: Mood normal.        Behavior: Behavior normal.       Diabetic Foot Exam - Simple   Simple Foot Form Diabetic Foot exam was performed with the following findings: Yes 10/07/2022 11:18 AM  Visual Inspection No deformities, no ulcerations, no other skin breakdown bilaterally: Yes Sensation Testing Intact to touch and monofilament testing bilaterally: Yes Pulse Check Posterior Tibialis and Dorsalis pulse intact bilaterally: Yes Comments       Lab Results  Component Value Date   WBC 7.9 04/08/2022   HGB 13.0 04/08/2022   HCT 39.3 04/08/2022   PLT 236.0 04/08/2022   GLUCOSE 126 (H) 04/08/2022   CHOL 154 04/08/2022   TRIG 89.0 04/08/2022   HDL 76.80 04/08/2022   LDLDIRECT 43.0 10/06/2021   LDLCALC 59 04/08/2022   ALT 32 04/08/2022   AST 18 04/08/2022   NA 141  04/08/2022   K 4.3 04/08/2022   CL 103 04/08/2022   CREATININE 0.87 04/08/2022   BUN 17 04/08/2022   CO2 31 04/08/2022   TSH 1.78 09/12/2020   HGBA1C 8.0 (H) 04/08/2022   MICROALBUR 7.5 (H) 10/06/2021     Assessment & Plan:    See Problem List for Assessment and Plan of chronic medical problems.

## 2022-10-06 NOTE — Patient Instructions (Addendum)
      Blood work was ordered.   The lab is on the first floor.    Medications changes include :   none      Return in about 6 months (around 04/09/2023) for Physical Exam.

## 2022-10-07 ENCOUNTER — Ambulatory Visit (INDEPENDENT_AMBULATORY_CARE_PROVIDER_SITE_OTHER): Payer: Medicare Other | Admitting: Internal Medicine

## 2022-10-07 ENCOUNTER — Encounter: Payer: Self-pay | Admitting: Internal Medicine

## 2022-10-07 VITALS — BP 122/60 | HR 74 | Temp 98.0°F | Ht 61.5 in | Wt 135.0 lb

## 2022-10-07 DIAGNOSIS — E113599 Type 2 diabetes mellitus with proliferative diabetic retinopathy without macular edema, unspecified eye: Secondary | ICD-10-CM | POA: Diagnosis not present

## 2022-10-07 DIAGNOSIS — I10 Essential (primary) hypertension: Secondary | ICD-10-CM | POA: Diagnosis not present

## 2022-10-07 DIAGNOSIS — N814 Uterovaginal prolapse, unspecified: Secondary | ICD-10-CM

## 2022-10-07 DIAGNOSIS — E7849 Other hyperlipidemia: Secondary | ICD-10-CM | POA: Diagnosis not present

## 2022-10-07 DIAGNOSIS — Z7984 Long term (current) use of oral hypoglycemic drugs: Secondary | ICD-10-CM | POA: Diagnosis not present

## 2022-10-07 DIAGNOSIS — Z8673 Personal history of transient ischemic attack (TIA), and cerebral infarction without residual deficits: Secondary | ICD-10-CM | POA: Diagnosis not present

## 2022-10-07 LAB — COMPREHENSIVE METABOLIC PANEL
ALT: 17 U/L (ref 0–35)
AST: 17 U/L (ref 0–37)
Albumin: 4 g/dL (ref 3.5–5.2)
Alkaline Phosphatase: 68 U/L (ref 39–117)
BUN: 21 mg/dL (ref 6–23)
CO2: 32 mEq/L (ref 19–32)
Calcium: 9.9 mg/dL (ref 8.4–10.5)
Chloride: 101 mEq/L (ref 96–112)
Creatinine, Ser: 1.01 mg/dL (ref 0.40–1.20)
GFR: 52.37 mL/min — ABNORMAL LOW (ref >60.00)
Glucose, Bld: 109 mg/dL — ABNORMAL HIGH (ref 70–99)
Potassium: 4.2 mEq/L (ref 3.5–5.1)
Sodium: 139 mEq/L (ref 135–145)
Total Bilirubin: 0.4 mg/dL (ref 0.2–1.2)
Total Protein: 7.9 g/dL (ref 6.0–8.3)

## 2022-10-07 LAB — MICROALBUMIN / CREATININE URINE RATIO
Creatinine,U: 60.9 mg/dL
Microalb Creat Ratio: 45.5 mg/g — ABNORMAL HIGH (ref 0.0–30.0)
Microalb, Ur: 27.7 mg/dL — ABNORMAL HIGH (ref 0.0–1.9)

## 2022-10-07 LAB — LIPID PANEL
Cholesterol: 115 mg/dL (ref 0–200)
HDL: 65.9 mg/dL (ref >39.00)
LDL Cholesterol: 31 mg/dL (ref 0–99)
NonHDL: 48.62
Total CHOL/HDL Ratio: 2
Triglycerides: 88 mg/dL (ref 0.0–149.0)
VLDL: 17.6 mg/dL (ref 0.0–40.0)

## 2022-10-07 LAB — HEMOGLOBIN A1C: Hgb A1c MFr Bld: 7.2 % — ABNORMAL HIGH (ref 4.6–6.5)

## 2022-10-07 NOTE — Assessment & Plan Note (Signed)
Chronic History of TIA Continue 325 mg of aspirin daily Continue atorvastatin 20 mg daily Stressed good blood pressure, sugar control

## 2022-10-07 NOTE — Assessment & Plan Note (Signed)
Chronic °Regular exercise and healthy diet encouraged °Check lipid panel  °Continue atorvastatin 20 mg daily °

## 2022-10-07 NOTE — Assessment & Plan Note (Addendum)
Chronic   Lab Results  Component Value Date   HGBA1C 8.0 (H) 04/08/2022   Sugars not ideally controlled, but seem to be controlled at home Testing sugars 1 times a day Check A1c  Continue Jardiance 10 mg daily, metformin 1000 mg twice daily Stressed regular exercise, diabetic diet

## 2022-10-07 NOTE — Assessment & Plan Note (Addendum)
Has pessary in place On estring Management per uro-gyn

## 2022-10-07 NOTE — Assessment & Plan Note (Signed)
Chronic °Blood pressure well controlled °CMP °Continue amlodipine 5 mg daily °

## 2022-11-19 ENCOUNTER — Ambulatory Visit: Payer: Medicare Other | Admitting: Obstetrics and Gynecology

## 2022-12-10 ENCOUNTER — Other Ambulatory Visit: Payer: Self-pay | Admitting: Internal Medicine

## 2022-12-15 ENCOUNTER — Other Ambulatory Visit: Payer: Self-pay | Admitting: Internal Medicine

## 2022-12-15 DIAGNOSIS — E113599 Type 2 diabetes mellitus with proliferative diabetic retinopathy without macular edema, unspecified eye: Secondary | ICD-10-CM

## 2022-12-20 ENCOUNTER — Other Ambulatory Visit: Payer: Self-pay | Admitting: Internal Medicine

## 2022-12-24 ENCOUNTER — Telehealth: Payer: Self-pay | Admitting: Internal Medicine

## 2022-12-24 ENCOUNTER — Other Ambulatory Visit: Payer: Self-pay

## 2022-12-24 MED ORDER — ATORVASTATIN CALCIUM 20 MG PO TABS: 20.0000 mg | ORAL_TABLET | Freq: Every day | ORAL | 11 refills | Status: DC

## 2022-12-24 NOTE — Telephone Encounter (Signed)
Prescription Request  12/24/2022  LOV: 10/07/2022  What is the name of the medication or equipment? atorvastatin  Have you contacted your pharmacy to request a refill? Yes   Which pharmacy would you like this sent to?  [facility] [address] ([address]), [place] - [address] [place]) [place] [address] Phone: [phone] Fax: [phone]    Patient notified that their request is being sent to the clinical staff for review and that they should receive a response within 2 business days.   Please advise at Mobile [phone] (mobile)

## 2022-12-24 NOTE — Telephone Encounter (Signed)
Sent in today 

## 2022-12-28 ENCOUNTER — Other Ambulatory Visit: Payer: Self-pay | Admitting: Internal Medicine

## 2022-12-30 ENCOUNTER — Ambulatory Visit: Payer: Medicare Other | Admitting: Neurology

## 2022-12-30 ENCOUNTER — Ambulatory Visit: Payer: Medicare Other | Admitting: Obstetrics and Gynecology

## 2022-12-30 ENCOUNTER — Encounter: Payer: Self-pay | Admitting: Neurology

## 2022-12-30 ENCOUNTER — Encounter: Payer: Self-pay | Admitting: Obstetrics and Gynecology

## 2022-12-30 VITALS — BP 124/76 | Ht 61.0 in | Wt 136.0 lb

## 2022-12-30 VITALS — BP 128/66 | HR 71

## 2022-12-30 DIAGNOSIS — G459 Transient cerebral ischemic attack, unspecified: Secondary | ICD-10-CM

## 2022-12-30 DIAGNOSIS — N816 Rectocele: Secondary | ICD-10-CM

## 2022-12-30 DIAGNOSIS — N952 Postmenopausal atrophic vaginitis: Secondary | ICD-10-CM

## 2022-12-30 DIAGNOSIS — N812 Incomplete uterovaginal prolapse: Secondary | ICD-10-CM | POA: Diagnosis not present

## 2022-12-30 DIAGNOSIS — T83719A Erosion of other prosthetic materials to surrounding organ or tissue, initial encounter: Secondary | ICD-10-CM | POA: Diagnosis not present

## 2022-12-30 DIAGNOSIS — N8110 Cystocele, unspecified: Secondary | ICD-10-CM

## 2022-12-30 NOTE — Patient Instructions (Signed)
Continue current medications Continue to follow-up with PCP Return as needed.

## 2022-12-30 NOTE — Progress Notes (Signed)
Patricia Mccullough   Subjective:     Chief Complaint:  Chief Complaint  Patient presents with   Pessary Check    Patricia Mccullough is a 82 y.o. female is here for pessary check.    History of Present Illness: Patricia Mccullough is a 82 y.o. female with stage II pelvic organ prolapse who presents for a pessary check. She is using a size #3 cube pessary. The pessary has been working well and she has no complaints. She is using vaginal estrogen via an E-string. She endorses vaginal bleeding.  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   Past Surgical History: She  has a past surgical history that includes Cataract extraction (12/16/2008); Vetrectomy (02/16/2007); MM BREAST STEREO BIOPSY LEFT ([facility] HX) (Left, 11/2021); and Breast biopsy (Left, 12/18/2021).   Medications: She has a current medication list which includes the following prescription(s): amlodipine, aspirin ec, atorvastatin, freestyle libre sensor system, freestyle libre 2 sensor, estradiol, fluticasone, jardiance, metformin, and systane balance.   Allergies: Patient has No Known Allergies.   Social History: Patient  reports that she has never smoked. She has never used smokeless tobacco. She reports that she does not drink alcohol and does not use drugs.      Objective:    Physical Exam: BP 128/66   Pulse 71  Gen: No apparent distress, A&O x 3. Detailed Urogynecologic Evaluation:  Pelvic Exam: Normal external female genitalia; Bartholin's and Skene's glands normal in appearance; urethral meatus normal in appearance, no urethral masses or discharge. The pessary was noted to be in place. It was removed and cleaned. Speculum exam revealed erosion in the vagina. The  pessary was left out and patient was encouraged to use estrogen cream nightly for the next week. 3 areas of erosion treated with silver nitrate and bleeding was noted to stop.    Assessment/Plan:    Assessment: Ms. Capanna is a 82 y.o. with stage II pelvic organ prolapse here for a pessary check. She is doing well.  Plan: She will use estrogen cream nightly to allow for healing. We will plan for her to be re-fit. I plan to try something with less edges such as a donut or other round pessary due to the irritation and erosion in the vagina despite e-string being in place.    Patient to return in 1-2 weeks for pessary re-fitting

## 2022-12-30 NOTE — Progress Notes (Signed)
[facility]  PATIENT: Patricia Mccullough DOB: May 08, 1940  REQUESTING CLINICIAN: Pincus Sanes, MD HISTORY FROM: Patient and chart review  REASON FOR VISIT: TIA    HISTORICAL  CHIEF COMPLAINT:  Chief Complaint  Patient presents with   Follow-up    Rm 13, TIA f/u, no changes   INTERVAL HISTORY 12/30/2022 Patient presents today for follow-up, she is alone.  Last visit was a year ago, since then she has been doing well.  She denies any new weakness, denies any new numbness and no speech disturbance.  Overall she is doing well.  She does exercise at least once every other day.  She does report some forgetfulness but she is still independent in all activities of daily living.  She does live with her husband and son.  No other complaints, no other concerns.   HISTORY OF PRESENT ILLNESS:  This is a 82 year old woman with past medical history of hypertension, hyperlipidemia, diabetes mellitus who is presenting after being admitted to the hospital for TIA.  Patient reports the morning of October 12 she had slurred speech and headaches, by the time she reached the hospital the headaches and slurred speech were gone.  She was admitted for TIA work-up.  MRI negative for any acute stroke, and discharged with DAPT for 21 days and patient to continue with aspirin daily.  She was initially started on aspirin 325 mg by PCP which was continued.  She reports since the discharge from the hospital she has not had any additional events or any additional episode.  Currently she is back to her baseline.  She does exercise, reported she walks a lot.  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  No other complaints.   Hospital Course  82 year old female with history of type 2 diabetes on Januvia and metformin, hypertension, hyperlipidemia not on treatment presented to the emergency room with acute onset of slurred speech and headache that lasted  about 1 to 2 hours and resolved by the time she arrived to the ER.  Vital signs stable in the ER.  CT head was normal.  She was seen as a code stroke, was mostly asymptomatic in the ER.  Underwent TIA work-up as below.   TIA:  Clinical findings, sudden onset slurred speech and headache spontaneously improved. CT head findings, no acute findings. MRI of the brain, no acute findings. CTA of the head and neck, no large vessel occlusion.  No stenosis. 2D echocardiogram,normal EF, asymmetrical left ventricular hypertrophy, no thrombus Antiplatelet therapy, aspirin 325 mg daily at home. LDL 90.  Not on a statin at home.  Hemoglobin A1c, 7.8.  Goal less than 7. Therapy recommendations, speech, PT OT has no new recommendations. Was seen by telemetry neurology on admission. Plan: As patient is medically stable and asymptomatic today no neurological deficits, patient to be discharged home on loading dose of Plavix 300 mg daily that was given in the ER followed by Plavix 75 mg daily for 21 days, aspirin 325 mg daily to continue. Patient is not on a statin at home, LDL is 90, she will benefit with LDL less than 70.  We will start patient on Lipitor 20 mg daily. Blood pressures are stable and she will resume her medications on discharge. Hemoglobin A1c is 7.8, recently started on second agent.  She will follow-up outpatient. Stable for discharge  OTHER MEDICAL CONDITIONS: Hypertension, hyperlipidemia, Diabetes   REVIEW OF SYSTEMS: Full 14 system review  of systems performed and negative with exception of: As noted in the HPI   ALLERGIES: No Known Allergies  HOME MEDICATIONS: Outpatient Medications Prior to Visit  Medication Sig Dispense Refill   amLODipine (NORVASC) 5 MG tablet TAKE 1 TABLET BY MOUTH ONCE DAILY *APPOINTMENT NEEDED FOR REFILLS* 90 tablet 0   aspirin EC 325 MG tablet Take 1 tablet (325 mg total) by mouth daily. 100 tablet 3   atorvastatin (LIPITOR) 20 MG tablet Take 1 tablet (20 mg  total) by mouth daily. 30 tablet 11   Continuous Blood Gluc Sensor (FREESTYLE LIBRE SENSOR SYSTEM) MISC Use as directed to check sugars.  Dx  E11.9 with hyperglycemia 1 each 0   Continuous Glucose Sensor (FREESTYLE LIBRE 2 SENSOR) MISC USE TO CHECK BLOOD SUGAR LEVELS 4 TIMES DAILY 2 each 0   estradiol (ESTRING) 7.5 MCG/24HR vaginal ring Place 1 each vaginally every 3 (three) months. 1 each 3   fluticasone (FLONASE) 50 MCG/ACT nasal spray Place into both nostrils daily.     JARDIANCE 10 MG TABS tablet TAKE 1 TABLET BY MOUTH ONCE DAILY IN THE MORNING 30 tablet 10   metFORMIN (GLUCOPHAGE) 1000 MG tablet TAKE 1 TABLET BY MOUTH TWICE DAILY WITH MEALS 180 tablet 0   SYSTANE BALANCE 0.6 % SOLN Place 1 drop into both eyes in the morning, at noon, in the evening, and at bedtime.     No facility-administered medications prior to visit.    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   Thyroid nodule 05/15/2013   Right side, IR bx 5/15: benign follicular nodule   TIA (transient ischemic attack)     PAST SURGICAL HISTORY: Past Surgical History:  Procedure Laterality Date   BREAST BIOPSY Left 12/18/2021   FIBROCYSTIC CHANGES   CATARACT EXTRACTION  12/16/2008   MM BREAST STEREO BIOPSY LEFT ([facility] HX) Left 11/2021   Vetrectomy  02/16/2007    FAMILY HISTORY: Family History  Problem Relation Age of Onset   Breast cancer Mother    Stroke Sister    Heart Problems Sister     SOCIAL HISTORY: Social History   Socioeconomic History   Marital status: Married    Spouse name: Not on file   Number of children: 3   Years of education: Not on file   Highest education level: Not on file  Occupational History   Not on file  Tobacco Use   Smoking status: Never   Smokeless  tobacco: Never  Vaping Use   Vaping status: Never Used  Substance and Sexual Activity   Alcohol use: No   Drug use: No   Sexual activity: Not Currently    Partners: Male    Comment: 1st intercourse 82 yo-Fewer than 5 partners  Other Topics Concern   Not on file  Social History Narrative   Married, lives with spouse and their grown son. 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   Social Determinants of Health   Financial Resource Strain: Low Risk  (08/02/2022)   Overall Financial Resource Strain (CARDIA)    Difficulty of Paying Living Expenses: Not hard at all  Food Insecurity: No Food Insecurity (08/02/2022)   Hunger Vital Sign  Worried About Programme researcher, broadcasting/film/video in the Last Year: Never true    Ran Out of Food in the Last Year: Never true  Transportation Needs: No Transportation Needs (08/02/2022)   PRAPARE - Administrator, Civil Service (Medical): No    Lack of Transportation (Non-Medical): No  Physical Activity: Sufficiently Active (08/02/2022)   Exercise Vital Sign    Days of Exercise per Week: 5 days    Minutes of Exercise per Session: 30 min  Stress: No Stress Concern Present (08/02/2022)   [employer] of Occupational Health - Occupational Stress Questionnaire    Feeling of Stress : Not at all  Social Connections: Socially Integrated (08/02/2022)   Social Connection and Isolation Panel [NHANES]    Frequency of Communication with Friends and Family: More than three times a week    Frequency of Social Gatherings with Friends and Family: More than three times a week    Attends Religious Services: More than 4 times per year    Active Member of [employer] or Organizations: Yes    Attends Banker Meetings: More than 4 times per year    Marital Status: Married  Catering manager Violence: Not At Risk (08/02/2022)   Humiliation, Afraid, Rape, and Kick questionnaire     Fear of Current or Ex-Partner: No    Emotionally Abused: No    Physically Abused: No    Sexually Abused: No    PHYSICAL EXAM  GENERAL EXAM/CONSTITUTIONAL: Vitals:  Vitals:   12/30/22 1318  BP: 124/76  Weight: 136 lb (61.7 kg)  Height: 5\' 1"  (1.549 m)   Body mass index is 25.7 kg/m. Wt Readings from Last 3 Encounters:  12/30/22 136 lb (61.7 kg)  10/07/22 135 lb (61.2 kg)  08/02/22 140 lb (63.5 kg)   Patient is in no distress; well developed, nourished and groomed; neck is supple  MUSCULOSKELETAL: Gait, strength, tone, movements noted in Neurologic exam below  NEUROLOGIC: MENTAL STATUS:     01/18/2017   10:08 AM  MMSE - Mini Mental State Exam  Orientation to time 5  Orientation to Place 5  Registration 3  Attention/ Calculation 5  Recall 2  Language- name 2 objects 2  Language- repeat 1  Language- follow 3 step command 3  Language- read & follow direction 1  Write a sentence 1  Copy design 1  Total score 29   awake, alert, oriented to person, place and time recent and remote memory intact normal attention and concentration. 3/5 on serial 7's. She is a retired Public house manager fluent, comprehension intact, naming intact fund of knowledge appropriate  CRANIAL NERVE:  2nd, 3rd, 4th, 6th - visual fields full to confrontation, extraocular muscles intact, no nystagmus 5th - facial sensation symmetric 7th - facial strength symmetric 8th - hearing intact 9th - palate elevates symmetrically, uvula midline 11th - shoulder shrug symmetric 12th - tongue protrusion midline  MOTOR:  normal bulk and tone, full strength in the BUE, BLE  SENSORY:  normal and symmetric to light touch  COORDINATION:  finger-nose-finger, fine finger movements normal  REFLEXES:  deep tendon reflexes present and symmetric in the BUEs and absent in the BLEs  GAIT/STATION:  normal   DIAGNOSTIC DATA (LABS, IMAGING, TESTING) - I reviewed patient records, labs, notes,  testing and imaging myself where available.  Lab Results  Component Value Date   WBC 7.9 04/08/2022   HGB 13.0 04/08/2022   HCT 39.3 04/08/2022   MCV 94.2 04/08/2022  PLT 236.0 04/08/2022      Component Value Date/Time   NA 139 10/07/2022 1133   K 4.2 10/07/2022 1133   CL 101 10/07/2022 1133   CO2 32 10/07/2022 1133   GLUCOSE 109 (H) 10/07/2022 1133   BUN 21 10/07/2022 1133   CREATININE 1.01 10/07/2022 1133   CREATININE 1.10 03/20/2013 1503   CALCIUM 9.9 10/07/2022 1133   PROT 7.9 10/07/2022 1133   ALBUMIN 4.0 10/07/2022 1133   AST 17 10/07/2022 1133   ALT 17 10/07/2022 1133   ALKPHOS 68 10/07/2022 1133   BILITOT 0.4 10/07/2022 1133   GFRNONAA >60 11/27/2021 0340   GFRAA  01/09/2009 0420    >60        The eGFR has been calculated using the MDRD equation. This calculation has not been validated in all clinical situations. eGFR's persistently <60 mL/min signify possible Chronic Kidney Disease.   Lab Results  Component Value Date   CHOL 115 10/07/2022   HDL 65.90 10/07/2022   LDLCALC 31 10/07/2022   LDLDIRECT 43.0 10/06/2021   TRIG 88.0 10/07/2022   CHOLHDL 2 10/07/2022   Lab Results  Component Value Date   HGBA1C 7.2 (H) 10/07/2022   No results found for: "VITAMINB12" Lab Results  Component Value Date   TSH 1.78 09/12/2020    MRI Brain 11/27/21 1. No acute intracranial abnormality. 2. Evidence of chronic small vessel disease, moderate for age, but relatively mild progression since a 2015 MRI. 3. Chronic paranasal sinus inflammation, with a fluid level now in the left maxillary sinus raising the possibility of acute sinusitis.  CTA Head and Neck 11/27/21 No emergent large vessel occlusion or hemodynamically significant stenosis of the head or neck.  Echo 11/27/21 1. No significant LVOT obstruction. Left ventricular ejection fraction, by estimation, is 60 to 65%. The left ventricle has normal function. The left ventricle has no regional wall motion  abnormalities. There is severe asymmetric left ventricular hypertrophy. Left ventricular diastolic parameters are consistent with Grade I diastolic dysfunction (impaired relaxation). The average left ventricular global longitudinal strain is -25.0 %. The global longitudinal strain is normal.  2. Right ventricular systolic function is normal. The right ventricular size is normal. There is normal pulmonary artery systolic pressure.  3. No evidence of mitral valve regurgitation. Moderate mitral annular calcification.  4. The aortic valve was not well visualized. There is mild calcification of the aortic valve. Aortic valve regurgitation is not visualized     ASSESSMENT AND PLAN  82 y.o. year old female with vascular risk factors including hypertension, hyperlipidemia, diabetes mellitus who is presenting for TIA follow up.  Since last year, she continues to improve, currently denies any new neurological deficit, denies any headaches, no weakness, no numbness, no focal deficit.  Overall she doing well, she is compliant with her medications and she does exercise every other day.  Plan will be for patient to continue current medications, continue to follow with PCP and return as needed.   1. TIA (transient ischemic attack)     Patient Instructions  Continue current medications  Continue to follow up with PCP  Return as needed   No orders of the defined types were placed in this encounter.   No orders of the defined types were placed in this encounter.   Return if symptoms worsen or fail to improve.   Windell Norfolk, MD 12/30/2022, 1:42 PM  [facility] [address] [place], [place] [address] [phone]

## 2023-01-07 ENCOUNTER — Ambulatory Visit (INDEPENDENT_AMBULATORY_CARE_PROVIDER_SITE_OTHER): Payer: Medicare Other | Admitting: Obstetrics and Gynecology

## 2023-01-07 ENCOUNTER — Encounter: Payer: Self-pay | Admitting: Obstetrics and Gynecology

## 2023-01-07 VITALS — BP 131/76 | HR 80

## 2023-01-07 DIAGNOSIS — N8110 Cystocele, unspecified: Secondary | ICD-10-CM

## 2023-01-07 DIAGNOSIS — N952 Postmenopausal atrophic vaginitis: Secondary | ICD-10-CM

## 2023-01-07 DIAGNOSIS — N812 Incomplete uterovaginal prolapse: Secondary | ICD-10-CM

## 2023-01-07 DIAGNOSIS — N816 Rectocele: Secondary | ICD-10-CM

## 2023-01-07 NOTE — Progress Notes (Signed)
[facility]   Subjective:     Chief Complaint:  Chief Complaint  Patient presents with   Pessary Check    Patricia Mccullough is a 82 y.o. female is here for pessary check. Patient stated she's still having vaginal bleeding   History of Present Illness: Patricia Mccullough is a 82 y.o. female with stage II pelvic organ prolapse who presents for a pessary check. She is using a size #3 cube pessary but we removed it at last visit to allow tissues to rest. We will try another pessary today.   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   Past Surgical History: She  has a past surgical history that includes Cataract extraction (12/16/2008); Vetrectomy (02/16/2007); MM BREAST STEREO BIOPSY LEFT ([facility] HX) (Left, 11/2021); and Breast biopsy (Left, 12/18/2021).   Medications: She has a current medication list which includes the following prescription(s): amlodipine, aspirin ec, atorvastatin, freestyle libre sensor system, freestyle libre 2 sensor, estradiol, fluticasone, jardiance, metformin, and systane balance.   Allergies: Patient has No Known Allergies.   Social History: Patient  reports that she has never smoked. She has never used smokeless tobacco. She reports that she does not drink alcohol and does not use drugs.      Objective:    Physical Exam: BP 131/76   Pulse 80  Gen: No apparent distress, A&O x 3. Detailed Urogynecologic Evaluation:  Pelvic Exam: Normal external female genitalia; Bartholin's and Skene's glands normal in appearance; urethral meatus normal in appearance, no urethral masses or discharge.   We attempted a #5 Shaatz, #3 Donut, #4 Donut, and #5 Gellhorn which were all expelled with valsalva or urination.    Patient's #3 Cube pessary was replaced with E-string behind it after being unable to fit her with another pessary type.   Assessment/Plan:    Assessment: Ms. Amicucci is a 82 y.o. with stage II pelvic organ prolapse here for a pessary check. She is doing well.  Plan: She will keep the pessary in place until next visit. She will continue to have E-string in place. She will follow-up in 2-3 months for a pessary check/and or discuss surgery.   We discussed if she were to have a surgical procedure done, that the safest option would be a colpocleisis. She was given a print out on this.   Discussed with Dr. Florian Buff and the plan is if she does choose to go forward with surgical intervention we should get a TVUS prior to surgery and potentially schedule at [facility] for pessary removal prior to [place].

## 2023-01-27 ENCOUNTER — Ambulatory Visit: Payer: Medicare Other | Admitting: Obstetrics and Gynecology

## 2023-02-12 IMAGING — MG MM DIGITAL DIAGNOSTIC UNILAT*L* W/ TOMO W/ CAD
6 series · 6 of 18 positions shown · non-contrast
Comparison: Previous exam(s).

CLINICAL DATA: Patient recalled from screening for left breast
asymmetry.

EXAM:
DIGITAL DIAGNOSTIC UNILATERAL LEFT MAMMOGRAM WITH TOMOSYNTHESIS AND
CAD; ULTRASOUND LEFT BREAST LIMITED
TECHNIQUE: Left digital diagnostic mammography and breast tomosynthesis was
performed. The images were evaluated with computer-aided detection.;
Targeted ultrasound examination of the left breast was performed.

[L MLO synth-2D]
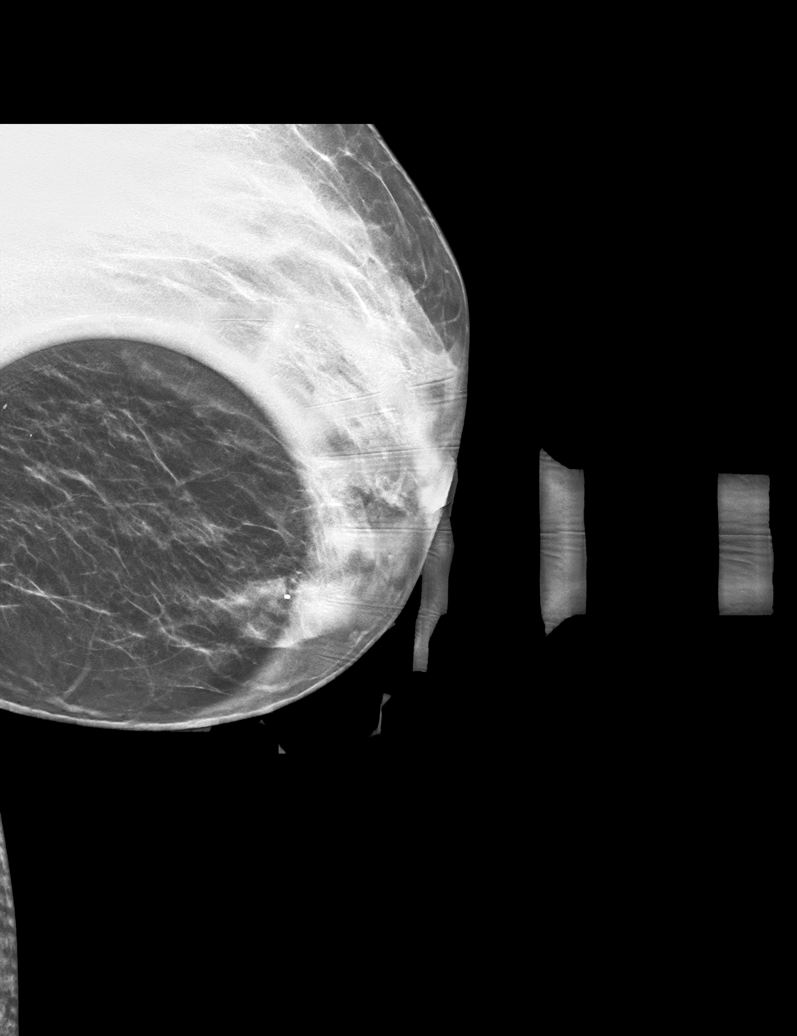

[L ML synth-2D]
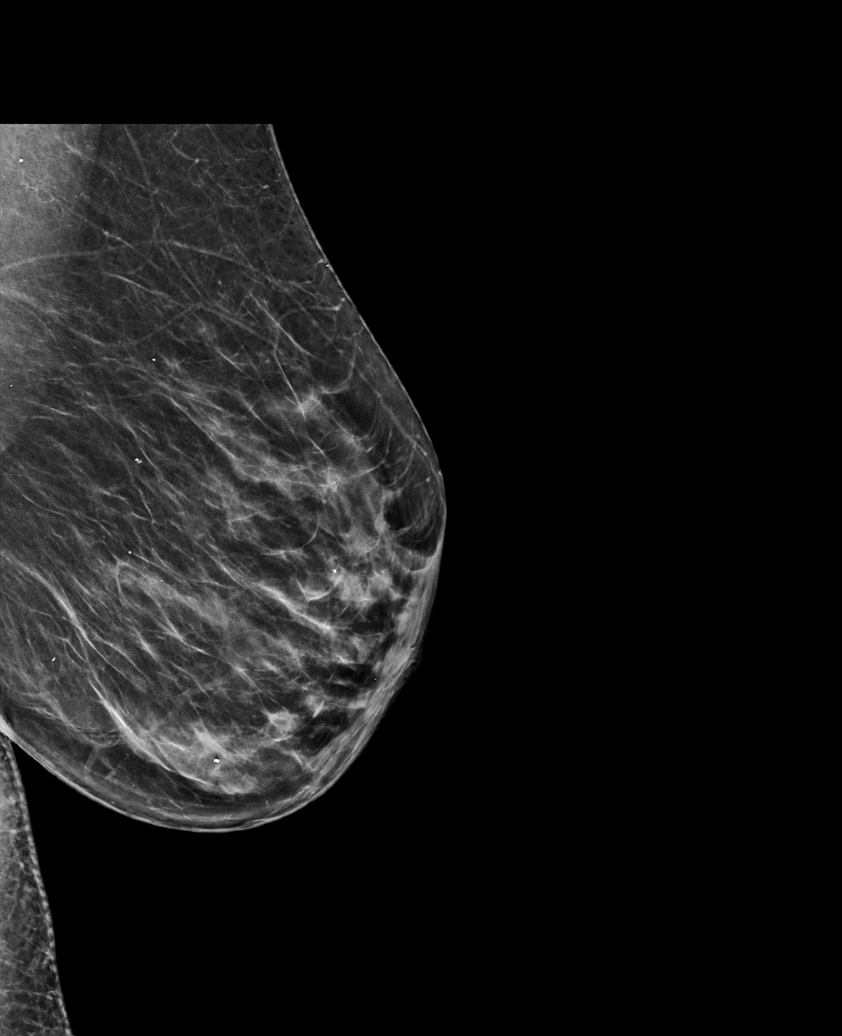

[L CC synth-2D]
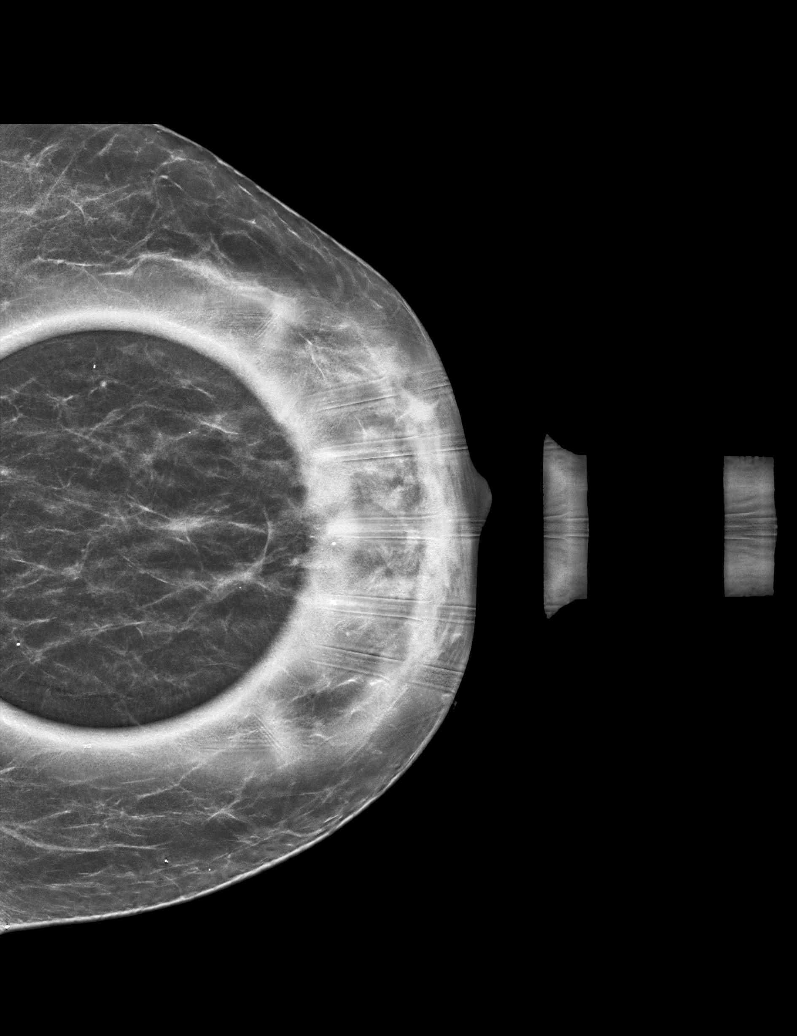

[L MLO tomo · tomo slice 22/43.0]
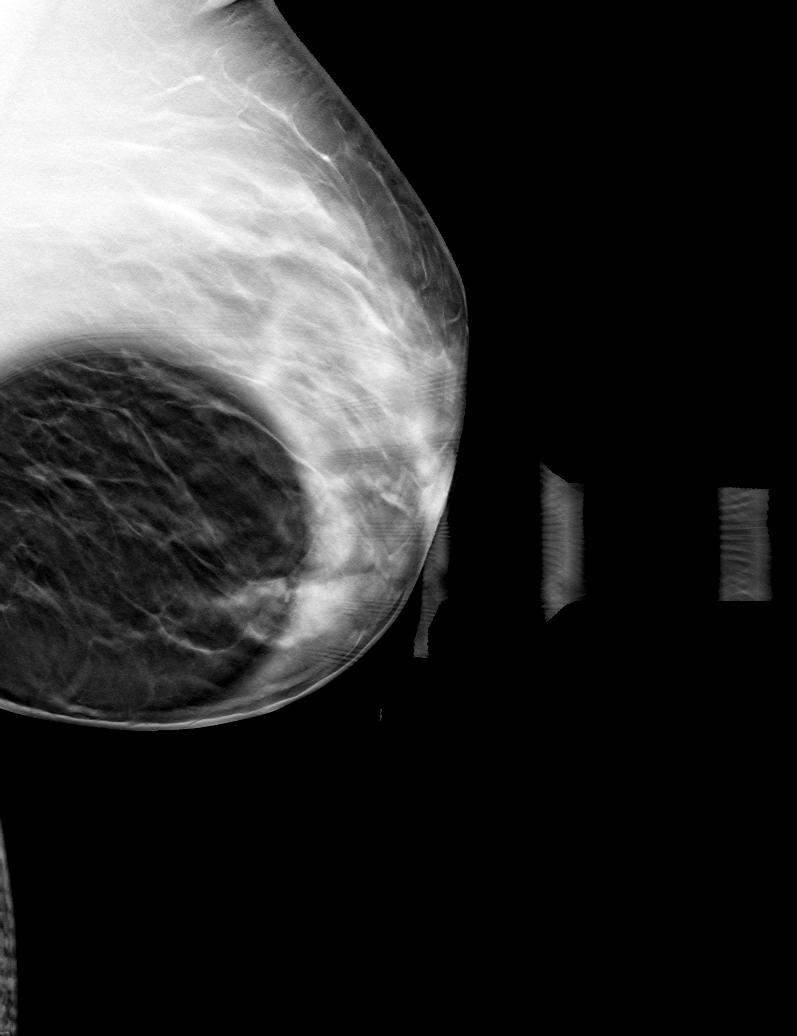

[L ML tomo · tomo slice 34/67.0]
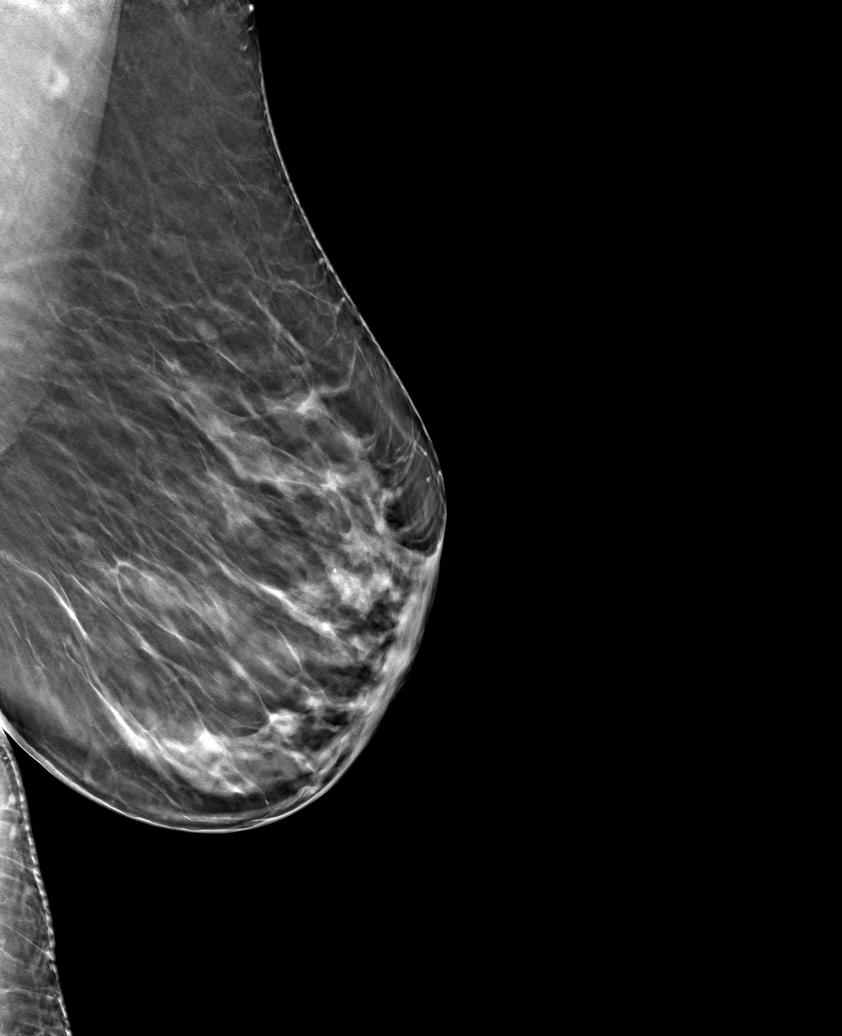

[L CC tomo · tomo slice 24/47.0]
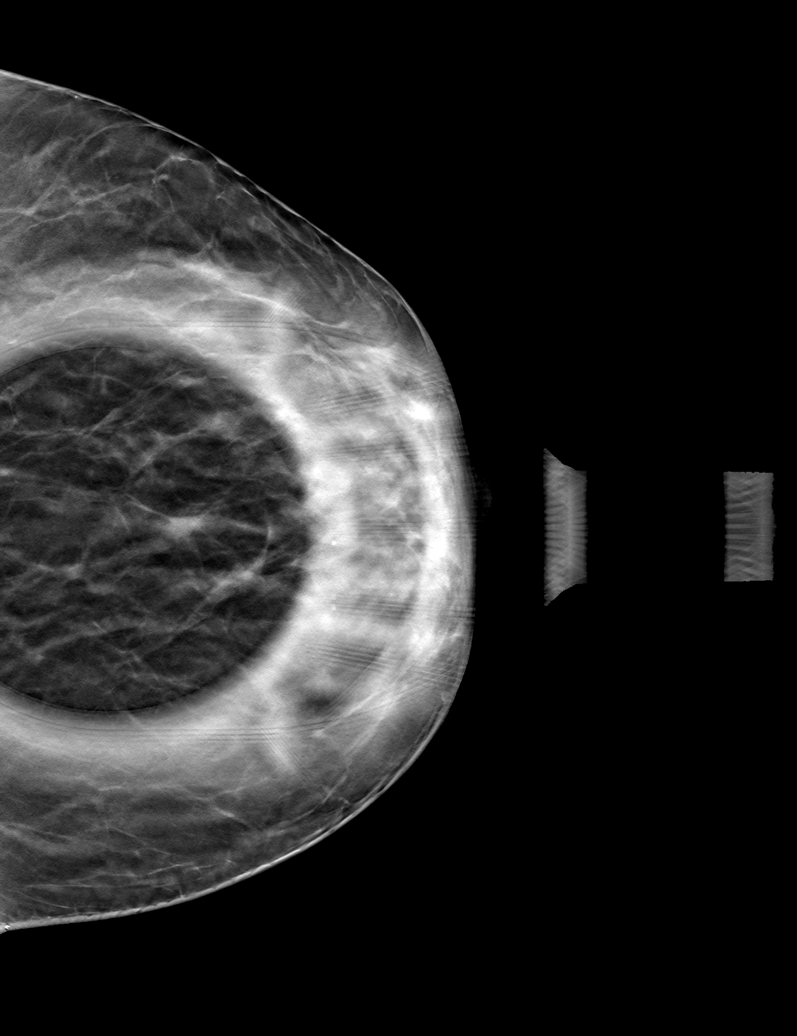

[6 of 18 positions shown; findings below may reference images not displayed]

ACR Breast Density Category c: The breast tissue is heterogeneously
dense, which may obscure small masses.
FINDINGS: Questioned asymmetry within the lower outer left breast partially
effaced with additional imaging suggestive of dense fibroglandular
tissue.

Targeted ultrasound is performed, showing a 5 x 5 x 3 mm oval
circumscribed hypoechoic mass left breast 5 o'clock position 4 cm
from nipple, felt to correspond with mammographic abnormality.
IMPRESSION: Probably benign left breast mass 5 o'clock position.

RECOMMENDATION:
Left breast diagnostic mammogram and ultrasound in 6 months to
reassess probably benign left breast mass.

I have discussed the findings and recommendations with the patient.
If applicable, a reminder letter will be sent to the patient
regarding the next appointment.

BI-RADS CATEGORY  3: Probably benign.

## 2023-02-12 IMAGING — US US BREAST*L* LIMITED INC AXILLA
1 series · 9 of 9 positions shown · non-contrast
Comparison: Previous exam(s).

CLINICAL DATA: Patient recalled from screening for left breast
asymmetry.

EXAM:
DIGITAL DIAGNOSTIC UNILATERAL LEFT MAMMOGRAM WITH TOMOSYNTHESIS AND
CAD; ULTRASOUND LEFT BREAST LIMITED
TECHNIQUE: Left digital diagnostic mammography and breast tomosynthesis was
performed. The images were evaluated with computer-aided detection.;
Targeted ultrasound examination of the left breast was performed.

[Series 1: us breast*left* limited inc axilla · 0.06mm/px · 9 acquisitions, 9 frames shown]
[im 1/9]
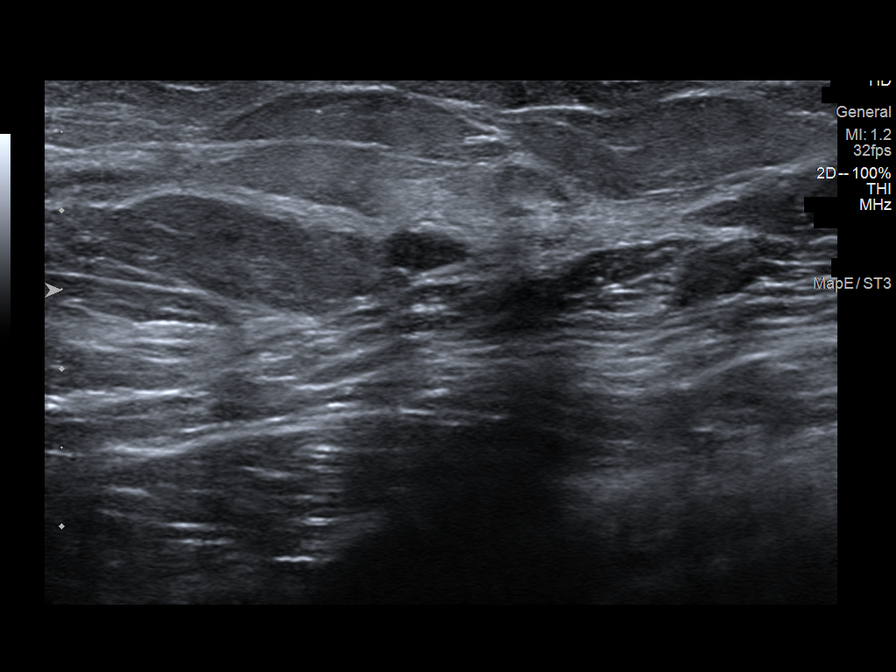
[im 2/9]
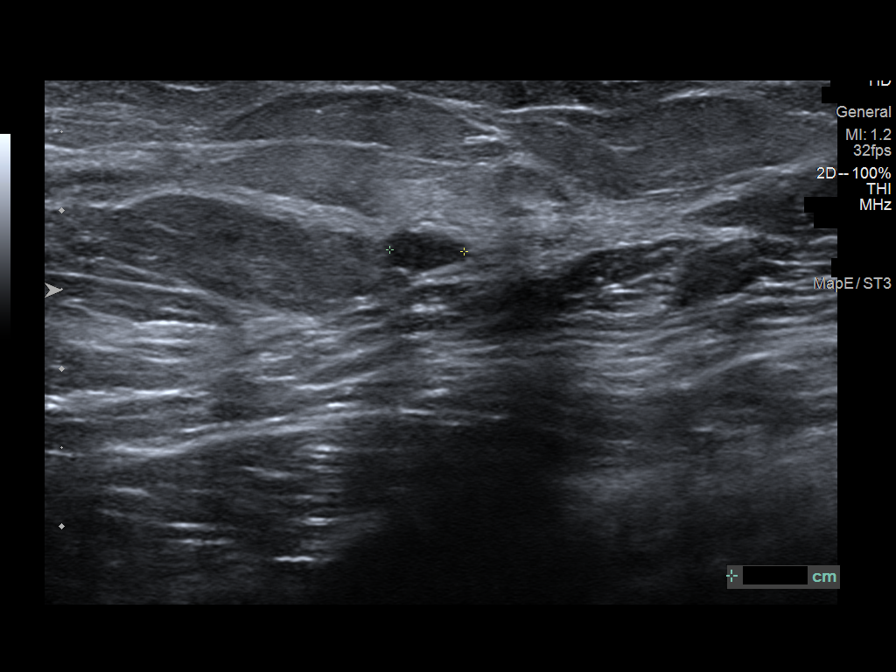
[im 3/9]
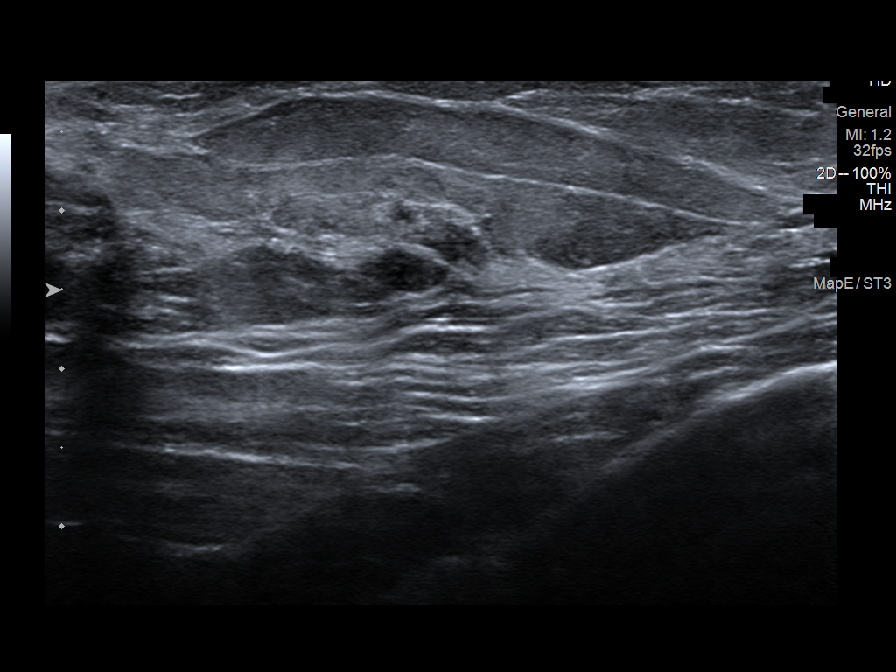
[im 4/9]
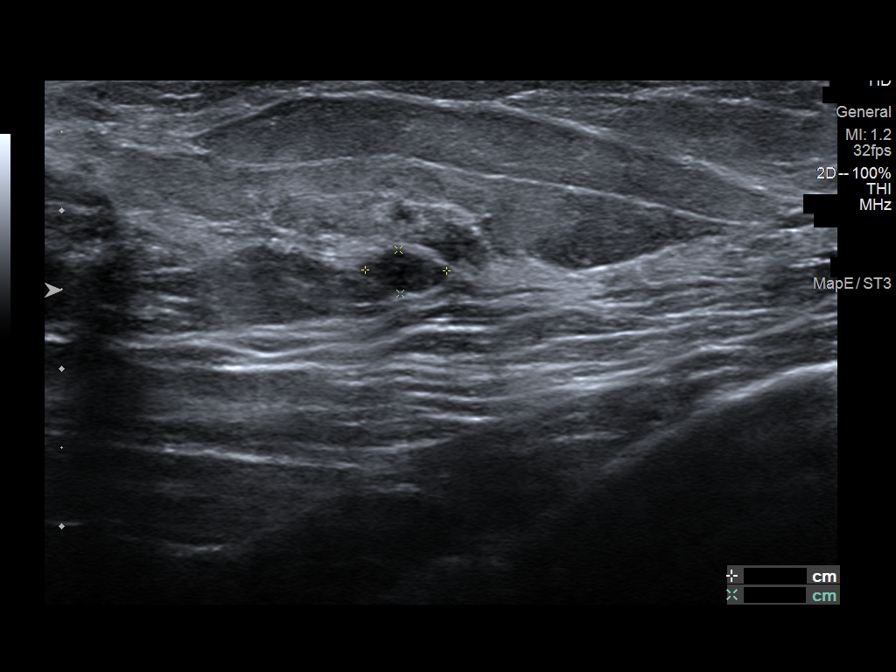
[im 5/9]
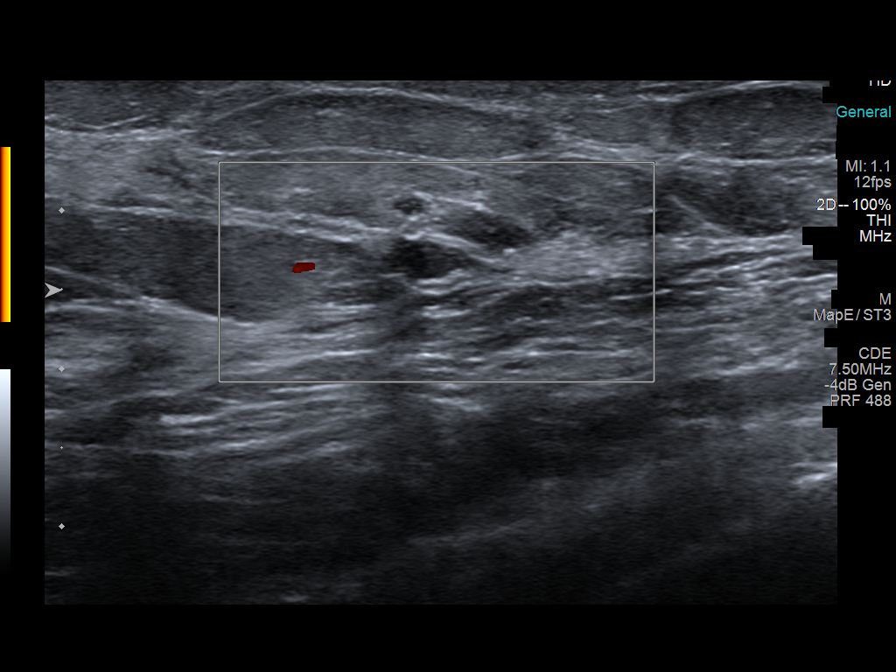
[im 6/9]
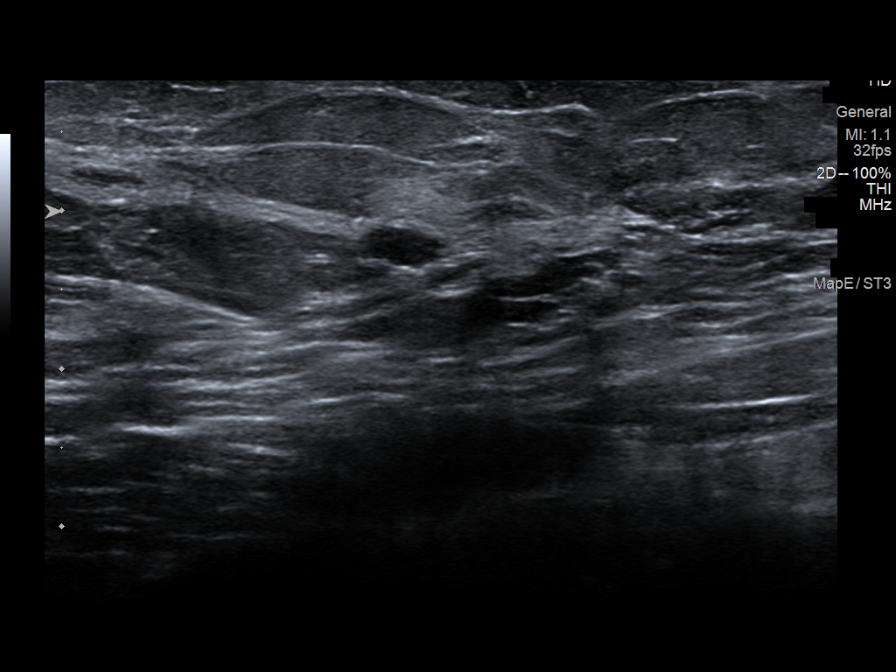
[im 7/9]
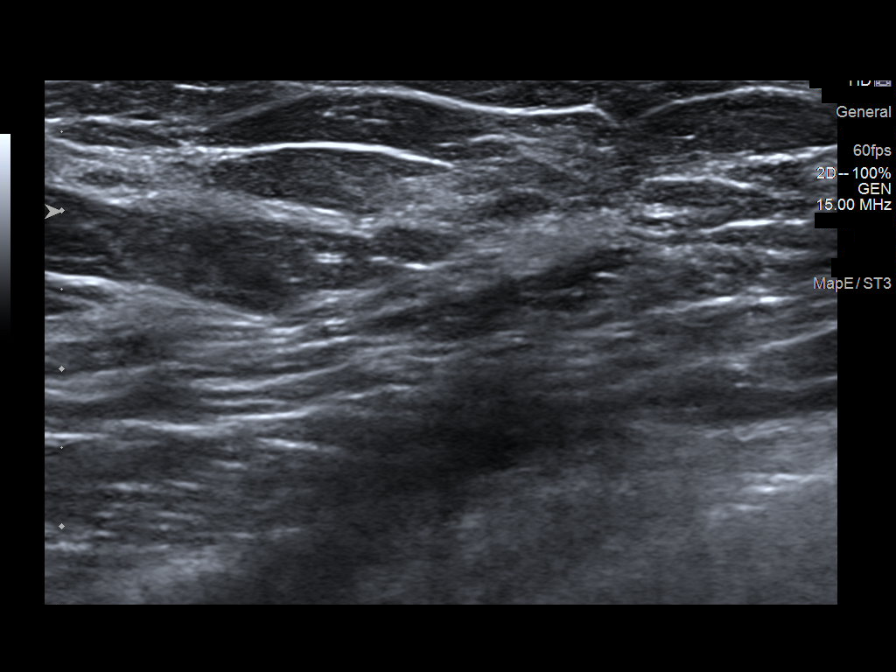
[im 8/9]
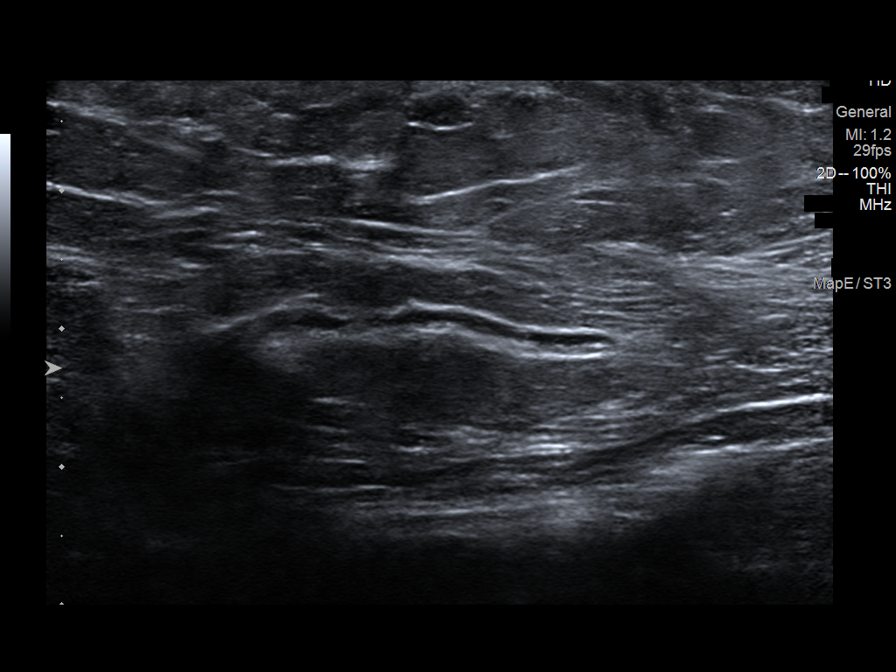
[im 9/9]
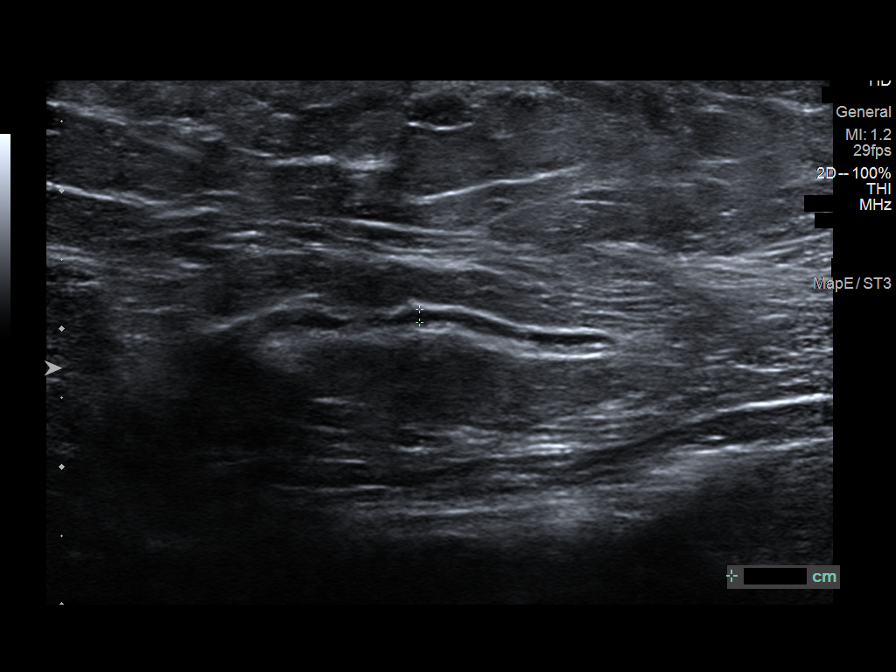

[9 of 9 positions shown; findings below may reference images not displayed]

ACR Breast Density Category c: The breast tissue is heterogeneously
dense, which may obscure small masses.
FINDINGS: Questioned asymmetry within the lower outer left breast partially
effaced with additional imaging suggestive of dense fibroglandular
tissue.

Targeted ultrasound is performed, showing a 5 x 5 x 3 mm oval
circumscribed hypoechoic mass left breast 5 o'clock position 4 cm
from nipple, felt to correspond with mammographic abnormality.
IMPRESSION: Probably benign left breast mass 5 o'clock position.

RECOMMENDATION:
Left breast diagnostic mammogram and ultrasound in 6 months to
reassess probably benign left breast mass.

I have discussed the findings and recommendations with the patient.
If applicable, a reminder letter will be sent to the patient
regarding the next appointment.

BI-RADS CATEGORY  3: Probably benign.

## 2023-02-18 ENCOUNTER — Ambulatory Visit: Payer: Medicare Other | Admitting: Obstetrics and Gynecology

## 2023-03-10 ENCOUNTER — Ambulatory Visit: Payer: Medicare Other | Admitting: Obstetrics and Gynecology

## 2023-03-15 ENCOUNTER — Other Ambulatory Visit: Payer: Self-pay | Admitting: Internal Medicine

## 2023-04-10 ENCOUNTER — Encounter: Payer: Self-pay | Admitting: Internal Medicine

## 2023-04-10 NOTE — Progress Notes (Unsigned)
 Subjective:    Patient ID: Patricia Mccullough, female    DOB: Jan 18, 1941, 83 y.o.   MRN: [id number]      HPI Ryland is here for a Physical exam and her chronic medical problems.     Her husband had a stroke and a few weeks ago and was doing better, but with advancement of diet he aspirated and is currently on a ventilator.  They will be attempting to get him off over the next couple of days.  She has not been taking as good of care of herself recently because of that.  She has not been eating as well.  Did not take the amlodipine today.   Medications and allergies reviewed with patient and updated if appropriate.  Current Outpatient Medications on File Prior to Visit  Medication Sig Dispense Refill   aspirin EC 325 MG tablet Take 1 tablet (325 mg total) by mouth daily. 100 tablet 3   Continuous Glucose Sensor (FREESTYLE LIBRE 2 SENSOR) MISC USE TO CHECK BLOOD SUGAR LEVELS 4 TIMES DAILY 2 each 0   fluticasone (FLONASE) 50 MCG/ACT nasal spray Place into both nostrils daily.     SYSTANE BALANCE 0.6 % SOLN Place 1 drop into both eyes in the morning, at noon, in the evening, and at bedtime.     No current facility-administered medications on file prior to visit.    Review of Systems  Constitutional:  Negative for fever.  HENT:  Positive for voice change. Negative for postnasal drip.   Eyes:  Negative for visual disturbance.  Respiratory:  Positive for cough (occ) and shortness of breath (when she rushes). Negative for wheezing.   Cardiovascular:  Positive for chest pain (a couple of episodes of pressure in chest - lasted 10 sec). Negative for palpitations and leg swelling.  Gastrointestinal:  Positive for constipation (stool softener controls it). Negative for abdominal pain and diarrhea.       No gerd  Genitourinary:  Positive for vaginal discharge (blood and smell). Negative for dysuria.  Musculoskeletal:  Negative for arthralgias and back pain.  Skin:  Negative for rash.   Neurological:  Negative for light-headedness and headaches.       Objective:   Vitals:   04/12/23 1036 04/12/23 1114  BP: (!) 140/60 132/68  Pulse: 80   Temp: 98.4 F (36.9 C)   SpO2: 94%    Filed Weights   04/12/23 1036  Weight: 135 lb (61.2 kg)   Body mass index is 25.51 kg/m.  BP Readings from Last 3 Encounters:  04/12/23 132/68  01/07/23 131/76  12/30/22 124/76    Wt Readings from Last 3 Encounters:  04/12/23 135 lb (61.2 kg)  12/30/22 136 lb (61.7 kg)  10/07/22 135 lb (61.2 kg)       Physical Exam Constitutional: She appears well-developed and well-nourished. No distress.  HENT:  Head: Normocephalic and atraumatic.  Right Ear: External ear normal. Normal ear canal and TM Left Ear: External ear normal.  Normal ear canal and TM Mouth/Throat: Oropharynx is clear and moist.  Eyes: Conjunctivae normal.  Neck: Neck supple. No tracheal deviation present. No thyromegaly present.  No carotid bruit  Cardiovascular: Normal rate, regular rhythm and normal heart sounds.   No murmur heard.  No edema. Pulmonary/Chest: Effort normal and breath sounds normal. No respiratory distress. She has no wheezes. She has no rales.  Breast: deferred   Abdominal: Soft. She exhibits no distension. There is no tenderness.  Lymphadenopathy: She has no cervical  adenopathy.  Skin: Skin is warm and dry. She is not diaphoretic.  Psychiatric: She has a normal mood and affect. Her behavior is normal.     Lab Results  Component Value Date   WBC 7.9 04/08/2022   HGB 13.0 04/08/2022   HCT 39.3 04/08/2022   PLT 236.0 04/08/2022   GLUCOSE 109 (H) 10/07/2022   CHOL 115 10/07/2022   TRIG 88.0 10/07/2022   HDL 65.90 10/07/2022   LDLDIRECT 43.0 10/06/2021   LDLCALC 31 10/07/2022   ALT 17 10/07/2022   AST 17 10/07/2022   NA 139 10/07/2022   K 4.2 10/07/2022   CL 101 10/07/2022   CREATININE 1.01 10/07/2022   BUN 21 10/07/2022   CO2 32 10/07/2022   TSH 1.78 09/12/2020   HGBA1C 7.2  (H) 10/07/2022   MICROALBUR 27.7 (H) 10/07/2022         Assessment & Plan:   Physical exam: Screening blood work  ordered Exercise  none Weight  normal Substance abuse  none   Reviewed recommended immunizations.   Health Maintenance  Topic Date Due   Zoster Vaccines- Shingrix (1 of 2) Never done   OPHTHALMOLOGY EXAM  12/04/2022   HEMOGLOBIN A1C  04/09/2023   COVID-19 Vaccine (3 - 2024-25 season) 04/28/2023 (Originally 10/17/2022)   INFLUENZA VACCINE  05/16/2023 (Originally 09/16/2022)   Medicare Annual Wellness (AWV)  08/02/2023   Diabetic kidney evaluation - eGFR measurement  10/07/2023   Diabetic kidney evaluation - Urine ACR  10/07/2023   FOOT EXAM  10/07/2023   DEXA SCAN  05/04/2027   DTaP/Tdap/Td (4 - Td or Tdap) 11/29/2029   Pneumonia Vaccine 79+ Years old  Completed   HPV VACCINES  Aged Out          See Problem List for Assessment and Plan of chronic medical problems.

## 2023-04-10 NOTE — Patient Instructions (Addendum)

## 2023-04-12 ENCOUNTER — Ambulatory Visit (INDEPENDENT_AMBULATORY_CARE_PROVIDER_SITE_OTHER): Payer: Medicare Other | Admitting: Internal Medicine

## 2023-04-12 VITALS — BP 132/68 | HR 80 | Temp 98.4°F | Ht 61.0 in | Wt 135.0 lb

## 2023-04-12 DIAGNOSIS — E7849 Other hyperlipidemia: Secondary | ICD-10-CM

## 2023-04-12 DIAGNOSIS — Z7984 Long term (current) use of oral hypoglycemic drugs: Secondary | ICD-10-CM | POA: Diagnosis not present

## 2023-04-12 DIAGNOSIS — I10 Essential (primary) hypertension: Secondary | ICD-10-CM | POA: Diagnosis not present

## 2023-04-12 DIAGNOSIS — Z8673 Personal history of transient ischemic attack (TIA), and cerebral infarction without residual deficits: Secondary | ICD-10-CM

## 2023-04-12 DIAGNOSIS — E113599 Type 2 diabetes mellitus with proliferative diabetic retinopathy without macular edema, unspecified eye: Secondary | ICD-10-CM

## 2023-04-12 DIAGNOSIS — Z0000 Encounter for general adult medical examination without abnormal findings: Secondary | ICD-10-CM | POA: Diagnosis not present

## 2023-04-12 LAB — COMPREHENSIVE METABOLIC PANEL
ALT: 15 U/L (ref 0–35)
AST: 14 U/L (ref 0–37)
Albumin: 3.7 g/dL (ref 3.5–5.2)
Alkaline Phosphatase: 64 U/L (ref 39–117)
BUN: 24 mg/dL — ABNORMAL HIGH (ref 6–23)
CO2: 29 meq/L (ref 19–32)
Calcium: 9.3 mg/dL (ref 8.4–10.5)
Chloride: 106 meq/L (ref 96–112)
Creatinine, Ser: 1.05 mg/dL (ref 0.40–1.20)
GFR: 49.81 mL/min — ABNORMAL LOW (ref >60.00)
Glucose, Bld: 185 mg/dL — ABNORMAL HIGH (ref 70–99)
Potassium: 4.2 meq/L (ref 3.5–5.1)
Sodium: 142 meq/L (ref 135–145)
Total Bilirubin: 0.5 mg/dL (ref 0.2–1.2)
Total Protein: 7.5 g/dL (ref 6.0–8.3)

## 2023-04-12 LAB — CBC WITH DIFFERENTIAL/PLATELET
Basophils Absolute: 0.1 10*3/uL (ref 0.0–0.1)
Basophils Relative: 0.9 % (ref 0.0–3.0)
Eosinophils Absolute: 0.5 10*3/uL (ref 0.0–0.7)
Eosinophils Relative: 7.6 % — ABNORMAL HIGH (ref 0.0–5.0)
HCT: 39.3 % (ref 36.0–46.0)
Hemoglobin: 12.7 g/dL (ref 12.0–15.0)
Lymphocytes Relative: 34.6 % (ref 12.0–46.0)
Lymphs Abs: 2.3 10*3/uL (ref 0.7–4.0)
MCHC: 32.3 g/dL (ref 30.0–36.0)
MCV: 96.1 fL (ref 78.0–100.0)
Monocytes Absolute: 0.6 10*3/uL (ref 0.1–1.0)
Monocytes Relative: 9.2 % (ref 3.0–12.0)
Neutro Abs: 3.1 10*3/uL (ref 1.4–7.7)
Neutrophils Relative %: 47.7 % (ref 43.0–77.0)
Platelets: 213 10*3/uL (ref 150.0–400.0)
RBC: 4.09 Mil/uL (ref 3.87–5.11)
RDW: 15 % (ref 11.5–15.5)
WBC: 6.5 10*3/uL (ref 4.0–10.5)

## 2023-04-12 LAB — HEMOGLOBIN A1C: Hgb A1c MFr Bld: 7.6 % — ABNORMAL HIGH (ref 4.6–6.5)

## 2023-04-12 LAB — LIPID PANEL
Cholesterol: 107 mg/dL (ref 0–200)
HDL: 61.7 mg/dL (ref >39.00)
LDL Cholesterol: 17 mg/dL (ref 0–99)
NonHDL: 45.74
Total CHOL/HDL Ratio: 2
Triglycerides: 143 mg/dL (ref 0.0–149.0)
VLDL: 28.6 mg/dL (ref 0.0–40.0)

## 2023-04-12 MED ORDER — METFORMIN HCL 1000 MG PO TABS: 1000.0000 mg | ORAL_TABLET | Freq: Two times a day (BID) | ORAL | 1 refills | Status: DC

## 2023-04-12 MED ORDER — JARDIANCE 10 MG PO TABS: 10.0000 mg | ORAL_TABLET | Freq: Every morning | ORAL | 2 refills | Status: DC

## 2023-04-12 MED ORDER — ATORVASTATIN CALCIUM 20 MG PO TABS: 20.0000 mg | ORAL_TABLET | Freq: Every day | ORAL | 2 refills | Status: AC

## 2023-04-12 MED ORDER — AMLODIPINE BESYLATE 5 MG PO TABS: ORAL_TABLET | ORAL | 2 refills | Status: DC

## 2023-04-12 NOTE — Assessment & Plan Note (Addendum)
 Chronic  Lab Results  Component Value Date   HGBA1C 7.2 (H) 10/07/2022   Sugars not ideally controlled Recently has not been eating as good because of increased stress Testing sugars 1 times a day Check A1c  Continue Jardiance 10 mg daily, metformin 1000 mg twice daily Stressed regular exercise, diabetic diet

## 2023-04-12 NOTE — Assessment & Plan Note (Addendum)
 Chronic History of TIA Continue aspirin daily Continue atorvastatin 20 mg daily Stressed good blood pressure, sugar control

## 2023-04-12 NOTE — Assessment & Plan Note (Signed)
Chronic °Regular exercise and healthy diet encouraged °Check lipid panel  °Continue atorvastatin 20 mg daily °

## 2023-04-12 NOTE — Assessment & Plan Note (Addendum)
 Chronic Blood pressure initially elevated, better on repeat CMP, CBC Continue amlodipine 5 mg daily

## 2023-04-14 ENCOUNTER — Encounter: Payer: Self-pay | Admitting: Internal Medicine

## 2023-04-22 ENCOUNTER — Ambulatory Visit: Payer: Medicare Other | Admitting: Obstetrics and Gynecology

## 2023-04-22 ENCOUNTER — Encounter: Payer: Self-pay | Admitting: Obstetrics and Gynecology

## 2023-04-22 VITALS — BP 149/69 | HR 78

## 2023-04-22 DIAGNOSIS — N823 Fistula of vagina to large intestine: Secondary | ICD-10-CM | POA: Diagnosis not present

## 2023-04-22 NOTE — Progress Notes (Signed)
 [facility]   Subjective:     Chief Complaint:  Chief Complaint  Patient presents with   Pessary Check   History of Present Illness: Jasminne Mealy is a 83 y.o. female with stage II pelvic organ prolapse who presents for a pessary check. She is using a size #3 cube pessary. Last visit attempted other sizes of pessary due to erosions but the cube was the only one not expelled. She has noticed vaginal bleeding "mixed with something else" along with a strong odor. Has been more constipated recently and is taking senna.   Pt is distressed because her husband is currently in the ICU.   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   Past Surgical History: She  has a past surgical history that includes Cataract extraction (12/16/2008); Vetrectomy (02/16/2007); MM BREAST STEREO BIOPSY LEFT (ARMX HX) (Left, 11/2021); and Breast biopsy (Left, 12/18/2021).   Medications: She has a current medication list which includes the following prescription(s): amlodipine, aspirin ec, atorvastatin, freestyle libre 2 sensor, fluticasone, jardiance, metformin, and systane balance.   Allergies: Patient has no known allergies.   Social History: Patient  reports that she has never smoked. She has never used smokeless tobacco. She reports that she does not drink alcohol and does not use drugs.      Objective:    Physical Exam: BP (!) 162/77   Pulse 79  Gen: No apparent distress, A&O x 3. Detailed Urogynecologic Evaluation:  Pelvic Exam: Normal external female genitalia with yellow discharge noted at the introitus. The pessary was removed and a large amount of stool noted on the cube. Estring was also removed.   The vagina was irrigated with a tumi  syringe and saline and a rectovaginal exam was performed . 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is present, approximately 5x3 cm in the mid portion of the vagina. Split speculum confirmed fistula. No rectal masses palpated.   Assessment/Plan:    Assessment: Ms. Arel is a 83 y.o. 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 She may notice stool come through the vagina but this is not life-threatening. Will plan for repair once tissue has healed some.  - Due to size of fistula, will refer to Colorectal and plan for potential joint surgery for rectovaginal fistula repair and prolapse repair.   Will have pt return in 2 weeks.   Marguerita Beards, MD

## 2023-04-26 ENCOUNTER — Telehealth: Payer: Self-pay | Admitting: Obstetrics and Gynecology

## 2023-04-26 NOTE — Telephone Encounter (Signed)
 Pts daughter called in because the pt complains of not being able to properly have a bowel movement. I encouraged them to call [facility] and inform them of the changes with her bowels. I tols the daughter I would also send a message to Dr. Florian Buff.

## 2023-04-26 NOTE — Telephone Encounter (Signed)
 Patricia Mccullough with [facility] called to say that they have Patricia Mccullough scheduled for and office visit on 3/24 @ 11:30am with Dr. Michaell Cowing instead of Dr. Cliffton Asters because of her pain and sooner appointment with Dr. Michaell Cowing.

## 2023-04-27 ENCOUNTER — Emergency Department (HOSPITAL_COMMUNITY)
Admission: EM | Admit: 2023-04-27 | Discharge: 2023-04-27 | Disposition: A | Discharge status: Home / Self Care | Attending: Emergency Medicine | Admitting: Emergency Medicine

## 2023-04-27 ENCOUNTER — Other Ambulatory Visit: Payer: Self-pay

## 2023-04-27 DIAGNOSIS — Z7984 Long term (current) use of oral hypoglycemic drugs: Secondary | ICD-10-CM | POA: Diagnosis not present

## 2023-04-27 DIAGNOSIS — I10 Essential (primary) hypertension: Secondary | ICD-10-CM | POA: Insufficient documentation

## 2023-04-27 DIAGNOSIS — Z79899 Other long term (current) drug therapy: Secondary | ICD-10-CM | POA: Diagnosis not present

## 2023-04-27 DIAGNOSIS — K5641 Fecal impaction: Secondary | ICD-10-CM | POA: Diagnosis not present

## 2023-04-27 DIAGNOSIS — K625 Hemorrhage of anus and rectum: Secondary | ICD-10-CM | POA: Diagnosis present

## 2023-04-27 DIAGNOSIS — Z7982 Long term (current) use of aspirin: Secondary | ICD-10-CM | POA: Diagnosis not present

## 2023-04-27 DIAGNOSIS — R58 Hemorrhage, not elsewhere classified: Secondary | ICD-10-CM | POA: Diagnosis not present

## 2023-04-27 DIAGNOSIS — R739 Hyperglycemia, unspecified: Secondary | ICD-10-CM | POA: Diagnosis not present

## 2023-04-27 DIAGNOSIS — E119 Type 2 diabetes mellitus without complications: Secondary | ICD-10-CM | POA: Insufficient documentation

## 2023-04-27 MED ORDER — FLEET ENEMA RE ENEM
1.0000 | ENEMA | Freq: Once | RECTAL | Status: AC
  Administered 2023-04-27: 1 via RECTAL
  Filled 2023-04-27: qty 1

## 2023-04-27 NOTE — ED Notes (Signed)
 Patient cleansed of feces and new chuck pad in place. Family at bedside. Provider to order enema per converse.

## 2023-04-27 NOTE — Discharge Instructions (Addendum)
 You can start taking 0.5 capfuls of MiraLAX every other day to make your stool softer.  If this still does not make your stool soft enough you can do half a cap every day.  If your stool still is not soft enough you can do a full Every day.  You do not need to take the senna and stool softener with this or the fiber.

## 2023-04-27 NOTE — ED Notes (Addendum)
 Nurse assisted patient with getting dressed. Family  at bedside. Patient d/c with home care instructions. Patient stated "I feel a lot better I feel like a different person now"

## 2023-04-27 NOTE — ED Provider Notes (Signed)
 [place] EMERGENCY DEPARTMENT AT [facility] Provider Note   CSN: [id number] Arrival date & time: 04/27/23  1906     History  Chief Complaint  Patient presents with   Rectal Bleeding    Patient brought in by [facility].Per EMS patient c/o rectal/vaginal bleed with pain in rectum. Patient reported she has a procedure in October. Patient has tried to use laxatives with no relief and no BM for 5 days. Hx diabetes and hypertension. No n/v reported. BS 256 with medics.     Patricia Mccullough is a 83 y.o. female.  Pt is a 83 y.o. female with hx of Diabetes mellitus type 2, Hyperlipidemia, Hypertension, TIA,   pelvic organ prolapse who had a  pessary check on 04/22/23 and found  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is present in the mid portion of the vagina so pessary was left out and pt is to follow up with colorectal surgery for joint repair.  Since Friday pt has has worsening pain with defecation.  She states the pain is only present when she has a bowel movement but the pain becomes so severe she is not able to push the stool out and she is also having some blood with stool.  She also feels like there is a large amt of stool stuck in the rectum.  No urinary problems at this time and she denies any abd pain or vomiting.  No fever.  The history is provided by the patient and medical records.  Rectal Bleeding      Home Medications Prior to Admission medications   Medication Sig Start Date End Date Taking? Authorizing Provider  amLODipine (NORVASC) 5 MG tablet TAKE 1 TABLET BY MOUTH ONCE DAILY . 04/12/23   Pincus Sanes, MD  aspirin EC 325 MG tablet Take 1 tablet (325 mg total) by mouth daily. 05/08/13   Newt Lukes, MD  atorvastatin (LIPITOR) 20 MG tablet Take 1 tablet (20 mg total) by mouth daily. 04/12/23 04/11/24  Pincus Sanes, MD  Continuous Glucose Sensor (FREESTYLE LIBRE 2 SENSOR) MISC USE TO CHECK BLOOD SUGAR LEVELS 4 TIMES DAILY 12/16/22   Pincus Sanes, MD  fluticasone  [facility]) 50 MCG/ACT nasal spray Place into both nostrils daily.    [provider]  JARDIANCE 10 MG TABS tablet Take 1 tablet (10 mg total) by mouth every morning. 04/12/23   Pincus Sanes, MD  metFORMIN (GLUCOPHAGE) 1000 MG tablet Take 1 tablet (1,000 mg total) by mouth 2 (two) times daily with a meal. 04/12/23   Burns, Bobette Mo, MD  SYSTANE BALANCE 0.6 % SOLN Place 1 drop into both eyes in the morning, at noon, in the evening, and at bedtime. 07/15/21   [provider]      Allergies    Patient has no known allergies.    Review of Systems   Review of Systems  Gastrointestinal:  Positive for hematochezia.    Physical Exam Updated Vital Signs BP 130/67 (BP Location: Left Arm)   Pulse 81   Temp 99 F (37.2 C) (Oral)   Resp 12   SpO2 97%  Physical Exam Vitals and nursing note reviewed.  Constitutional:      General: She is not in acute distress.    Appearance: She is well-developed.  HENT:     Head: Normocephalic and atraumatic.  Eyes:     Conjunctiva/sclera: Conjunctivae normal.     Pupils: Pupils are equal, round, and reactive to light.  Cardiovascular:  Rate and Rhythm: Normal rate and regular rhythm.     Heart sounds: No murmur heard. Pulmonary:     Effort: Pulmonary effort is normal. No respiratory distress.     Breath sounds: Normal breath sounds. No wheezing or rales.  Abdominal:     General: There is no distension.     Palpations: Abdomen is soft.     Tenderness: There is no abdominal tenderness. There is no guarding or rebound.  Musculoskeletal:        General: No tenderness. Normal range of motion.     Cervical back: Normal range of motion and neck supple.  Skin:    General: Skin is warm and dry.     Findings: No erythema or rash.  Neurological:     Mental Status: She is alert and oriented to person, place, and time.  Psychiatric:        Behavior: Behavior normal.     ED Results / Procedures / Treatments   Labs (all labs ordered are  listed, but only abnormal results are displayed) Labs Reviewed - No data to display  EKG None  Radiology No results found.  Procedures .Fecal disimpaction  Date/Time: 04/27/2023 8:08 PM  Performed by: Gwyneth Sprout, MD Authorized by: Gwyneth Sprout, MD  Consent: Verbal consent obtained. Local anesthesia used: no  Anesthesia: Local anesthesia used: no  Sedation: Patient sedated: no  Patient tolerance: patient tolerated the procedure well with no immediate complications Comments: Large amt of stool removed from the rectum and the vagina       Medications Ordered in ED Medications  sodium phosphate (FLEET) enema 1 enema (1 enema Rectal Given 04/27/23 2022)    ED Course/ Medical Decision Making/ A&P                                 Medical Decision Making Risk OTC drugs.   Pt with multiple medical problems and comorbidities and presenting today with a complaint that caries a high risk for morbidity and mortality.  Here today with intense rectal pain that comes and goes associated with trying to have a bowel movement in the setting of recently discovered rectovaginal fistula.  Patient has no abdominal pain or vomiting or symptoms concerning for obstruction.  She denies any urinary symptoms.  9:35 PM On exam pt appears to have fecal impaction.  Large amt of stool was removed manually from the rectum and the vagina.  Slight streaks of blood on stool but no obvious hemorroids or anal fissure  9:35 PM Patient is much improved after the disimpaction and reports the pain is gone.  Will have her start MiraLAX to make her stools softer but at this time appears stable for discharge home.         Final Clinical Impression(s) / ED Diagnoses Final diagnoses:  Fecal impaction in rectum [facility])    Rx / DC Orders ED Discharge Orders     None         Gwyneth Sprout, MD 04/27/23 2135

## 2023-05-06 ENCOUNTER — Ambulatory Visit (INDEPENDENT_AMBULATORY_CARE_PROVIDER_SITE_OTHER): Admitting: Obstetrics and Gynecology

## 2023-05-06 VITALS — BP 138/68 | HR 73

## 2023-05-06 DIAGNOSIS — N823 Fistula of vagina to large intestine: Secondary | ICD-10-CM | POA: Diagnosis not present

## 2023-05-06 NOTE — Progress Notes (Signed)
 [facility] Return Visit  SUBJECTIVE  History of Present Illness: Patricia Mccullough is a 83 y.o. female seen in follow-up for rectovaginal fistula after pessary use.   After last visit, she was referred to see colorectal surgery at [facility] surgery. She met with Dr Maisie Fus and was advised to get a colostomy to help with surgical healing.   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   Past Surgical History: She  has a past surgical history that includes Cataract extraction (12/16/2008); Vetrectomy (02/16/2007); MM BREAST STEREO BIOPSY LEFT (ARMX HX) (Left, 11/2021); and Breast biopsy (Left, 12/18/2021).   Medications: She has a current medication list which includes the following prescription(s): amlodipine, aspirin ec, atorvastatin, freestyle libre 2 sensor, fluticasone, jardiance, metformin, and systane balance.   Allergies: Patient has no known allergies.   Social History: Patient  reports that she has never smoked. She has never used smokeless tobacco. She reports that she does not drink alcohol and does not use drugs.     OBJECTIVE     Physical Exam: Vitals:   05/06/23 1032  BP: 138/68  Pulse: 73   Gen: No apparent distress, A&O x 3.  Detailed Urogynecologic Evaluation:  Deferred.    ASSESSMENT AND PLAN    Ms. Dlouhy is a 83 y.o. with:  1. 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- We discussed that due to the size of the fistula, would prefer to have the colostomy performed first, which would allow tissue healing and decrease of inflammation. Then after several months, can return for fistula repair. After repair has healed, then can consider colostomy reversal.  - We discussed that blood sugar control will be  essential in the healing process. Pt is determined to improve her outcomes so is considering waiting until her blood sugars are controlled to start the surgical process.  - Will communicate with Dr Maisie Fus regarding our discussion and potential plan.    Marguerita Beards, MD  Time spent: I spent 25 minutes dedicated to the care of this patient on the date of this encounter to include pre-visit review of records, face-to-face time with the patient  and post visit documentatio.

## 2023-05-09 ENCOUNTER — Encounter: Payer: Self-pay | Admitting: Obstetrics and Gynecology

## 2023-05-10 ENCOUNTER — Other Ambulatory Visit: Payer: Self-pay | Admitting: Internal Medicine

## 2023-05-10 DIAGNOSIS — E113599 Type 2 diabetes mellitus with proliferative diabetic retinopathy without macular edema, unspecified eye: Secondary | ICD-10-CM

## 2023-05-19 ENCOUNTER — Other Ambulatory Visit: Payer: Self-pay

## 2023-05-19 ENCOUNTER — Emergency Department (HOSPITAL_COMMUNITY)
Admission: EM | Admit: 2023-05-19 | Discharge: 2023-05-19 | Discharge status: Against Medical Advice | Attending: Emergency Medicine | Admitting: Emergency Medicine

## 2023-05-19 ENCOUNTER — Encounter (HOSPITAL_COMMUNITY): Payer: Self-pay

## 2023-05-19 DIAGNOSIS — K5900 Constipation, unspecified: Secondary | ICD-10-CM | POA: Insufficient documentation

## 2023-05-19 DIAGNOSIS — Z5321 Procedure and treatment not carried out due to patient leaving prior to being seen by health care provider: Secondary | ICD-10-CM | POA: Diagnosis not present

## 2023-05-19 DIAGNOSIS — N814 Uterovaginal prolapse, unspecified: Secondary | ICD-10-CM | POA: Insufficient documentation

## 2023-05-19 NOTE — ED Triage Notes (Signed)
 Constipation for 2-3 days. Pt states she has a prolapsed uterus and a fistula that causes this.

## 2023-06-01 ENCOUNTER — Other Ambulatory Visit: Payer: Self-pay | Admitting: Obstetrics and Gynecology

## 2023-06-01 DIAGNOSIS — N823 Fistula of vagina to large intestine: Secondary | ICD-10-CM

## 2023-06-01 NOTE — Progress Notes (Signed)
 Spoke with Dr Conni Deis at [facility] regarding ability for combined care with colorectal surgery at [facility] for rectovaginal fistula. Pt's daughter has made an appt with Dr Jacquiline Maul and she has recommended referral to colorectal Dr Johanne Mutton. Referral placed. Will ensure that records are faxed to their offices.

## 2023-06-17 ENCOUNTER — Telehealth: Payer: Self-pay | Admitting: Obstetrics and Gynecology

## 2023-06-17 NOTE — Telephone Encounter (Signed)
 Pt daughter Adolm Ahumada called in saying patient is having difficulty when urinating for the last couple days.  Says she feels like the urine is coming down but it never makes it to the toilet.  She's also having difficulty walking.

## 2023-06-17 NOTE — Telephone Encounter (Signed)
 Spoke with patient and her daughter. Sometimes she urinates normally and other times she tries to urinate and only a small amount comes out, or sometimes the urine sprays a bit. We discussed that this is likely because she no longer her a pessary and with the bladder falling, it can cause an obstruction. Since she is urinating normally most times, we discussed using her fingers to push up the vaginal wall if she is having trouble emptying. If this becomes a more frequent issue, can discuss self- catheterization.   Pt also reports she has found a better bowel regimen with miralax, senna and a stool softener. She is no longer constipated. Pt will call with any further issues.   Arma Lamp, MD

## 2023-06-23 ENCOUNTER — Encounter: Payer: Self-pay | Admitting: Podiatry

## 2023-06-23 ENCOUNTER — Ambulatory Visit (INDEPENDENT_AMBULATORY_CARE_PROVIDER_SITE_OTHER): Payer: Medicare Other | Admitting: Podiatry

## 2023-06-23 DIAGNOSIS — E1159 Type 2 diabetes mellitus with other circulatory complications: Secondary | ICD-10-CM | POA: Diagnosis not present

## 2023-06-23 NOTE — Progress Notes (Addendum)
 This patient presents to the office for diabetic foot exam.   This patient says there is no pain or discomfort in her feet.  No history of infection or drainage.  This patient presents to the office for foot exam due to having a history of diabetes.  Vascular  Dorsalis pedis and posterior tibial pulses are within normal limits B/L.  Capillary return  WNL.  Cold feet .  Skin turgor  WNL  Sensorium  Senn Weinstein monofilament wire  WNL. Normal tactile sensation.  Nail Exam  Patient has normal nails with no evidence of bacterial or fungal infection.  Orthopedic  Exam  Muscle tone and muscle strength  WNL.  No limitations of motion feet  B/L.  No crepitus or joint effusion noted.  Foot type is unremarkable and digits show no abnormalities.  Bony prominences are unremarkable.  Skin  No open lesions.  Normal skin texture and turgor.   Diabetes with no complications  Diabetic foot exam was performed.  There is  no evidence of vascular  pathology  LOPS  WNL   RTC  2   year   Ruffin Cotton DPM

## 2023-07-28 ENCOUNTER — Other Ambulatory Visit: Payer: Self-pay | Admitting: Internal Medicine

## 2023-07-28 DIAGNOSIS — Z1231 Encounter for screening mammogram for malignant neoplasm of breast: Secondary | ICD-10-CM

## 2023-08-08 ENCOUNTER — Ambulatory Visit

## 2023-08-08 ENCOUNTER — Ambulatory Visit (INDEPENDENT_AMBULATORY_CARE_PROVIDER_SITE_OTHER): Payer: Medicare Other

## 2023-08-08 VITALS — Ht 61.0 in | Wt 125.0 lb

## 2023-08-08 DIAGNOSIS — Z0100 Encounter for examination of eyes and vision without abnormal findings: Secondary | ICD-10-CM

## 2023-08-08 DIAGNOSIS — Z0000 Encounter for general adult medical examination without abnormal findings: Secondary | ICD-10-CM | POA: Diagnosis not present

## 2023-08-08 NOTE — Patient Instructions (Signed)
 Patricia Mccullough , Thank you for taking time out of your busy schedule to complete your Annual Wellness Visit with me. I enjoyed our conversation and look forward to speaking with you again next year. I, as well as your care team,  appreciate your ongoing commitment to your health goals. Please review the following plan we discussed and let me know if I can assist you in the future. Your Game plan/ To Do List    Referrals: If you haven't heard from the office you've been referred to, please reach out to them at the phone provided.  Referral to Dr CANDIE Schneider of [facility] for an eye exam Follow up Visits: Next Medicare AWV with our clinical staff: 08/08/2024   Have you seen your provider in the last 6 months (3 months if uncontrolled diabetes)? Yes Next Office Visit with your provider: 09/2023  Clinician Recommendations:  Aim for 30 minutes of exercise or brisk walking, 6-8 glasses of water, and 5 servings of fruits and vegetables each day. Educated and advised on getting the COVID and Shingles vaccines in 2025.        This is a list of the screening recommended for you and due dates:  Health Maintenance  Topic Date Due   Zoster (Shingles) Vaccine (1 of 2) Never done   COVID-19 Vaccine (3 - 2024-25 season) 10/17/2022   Eye exam for diabetics  12/04/2022   Flu Shot  09/16/2023   Yearly kidney health urinalysis for diabetes  10/07/2023   Hemoglobin A1C  10/10/2023   Yearly kidney function blood test for diabetes  04/11/2024   Complete foot exam   06/22/2024   Medicare Annual Wellness Visit  08/07/2024   DEXA scan (bone density measurement)  05/04/2027   DTaP/Tdap/Td vaccine (4 - Td or Tdap) 11/29/2029   Pneumococcal Vaccine for age over 48  Completed   HPV Vaccine  Aged Out   Meningitis B Vaccine  Aged Out    Advanced directives: (Provided) Advance directive discussed with you today. I have provided a copy for you to complete at home and have notarized. Once this is complete, please bring a  copy in to our office so we can scan it into your chart.  Advance Care Planning is important because it:  [x]  Makes sure you receive the medical care that is consistent with your values, goals, and preferences  [x]  It provides guidance to your family and loved ones and reduces their decisional burden about whether or not they are making the right decisions based on your wishes.  Follow the link provided in your after visit summary or read over the paperwork we have mailed to you to help you started getting your Advance Directives in place. If you need assistance in completing these, please reach out to us  so that we can help you!

## 2023-08-08 NOTE — Progress Notes (Signed)
 Subjective:   Patricia Mccullough is a 83 y.o. who presents for a Medicare Wellness preventive visit.  As a reminder, Annual Wellness Visits don't include a physical exam, and some assessments may be limited, especially if this visit is performed virtually. We may recommend an in-person follow-up visit with your provider if needed.  Visit Complete: Virtual I connected with  Gregorio Loreli Pouch on 08/08/23 by a audio enabled telemedicine application and verified that I am speaking with the correct person using two identifiers.  Patient Location: Home  Provider Location: Office/Clinic  I discussed the limitations of evaluation and management by telemedicine. The patient expressed understanding and agreed to proceed.  Vital Signs: Because this visit was a virtual/telehealth visit, some criteria may be missing or patient reported. Any vitals not documented were not able to be obtained and vitals that have been documented are patient reported.  VideoDeclined- This patient declined Librarian, academic. Therefore the visit was completed with audio only.  Persons Participating in Visit: Patient.  AWV Questionnaire: No: Patient Medicare AWV questionnaire was not completed prior to this visit.  Cardiac Risk Factors include: advanced age (>29men, >47 women);diabetes mellitus;dyslipidemia;hypertension     Objective:    Today's Vitals   08/08/23 1057  Weight: 125 lb (56.7 kg)  Height: 5' 1 (1.549 m)   Body mass index is 23.62 kg/m.     08/08/2023   10:56 AM 08/02/2022   11:14 AM 11/26/2021    6:29 PM 05/19/2021    2:21 PM 08/24/2020    3:34 PM 08/21/2020    2:22 PM 08/23/2019    1:54 PM  Advanced Directives  Does Patient Have a Medical Advance Directive? No No No No No No No  Would patient like information on creating a medical advance directive? Yes (MAU/Ambulatory/Procedural Areas - Information given) No - Patient declined No - Patient declined No - Patient  declined No - Patient declined No - Patient declined No - Patient declined    Current Medications (verified) Outpatient Encounter Medications as of 08/08/2023  Medication Sig   amLODipine  (NORVASC ) 5 MG tablet TAKE 1 TABLET BY MOUTH ONCE DAILY .   aspirin  EC 325 MG tablet Take 1 tablet (325 mg total) by mouth daily.   atorvastatin  (LIPITOR) 20 MG tablet Take 1 tablet (20 mg total) by mouth daily.   Continuous Glucose Sensor (FREESTYLE LIBRE 2 SENSOR) MISC USE TO CHECK BLOOD SUGAR 4 TIMES DAILY   fluticasone  (FLONASE ) 50 MCG/ACT nasal spray Place 1 spray into both nostrils 2 (two) times daily as needed for allergies or rhinitis.   JARDIANCE  10 MG TABS tablet Take 1 tablet (10 mg total) by mouth every morning.   metFORMIN  (GLUCOPHAGE ) 1000 MG tablet Take 1 tablet (1,000 mg total) by mouth 2 (two) times daily with a meal.   SYSTANE BALANCE 0.6 % SOLN Place 1 drop into both eyes in the morning, at noon, in the evening, and at bedtime.   No facility-administered encounter medications on file as of 08/08/2023.    Allergies (verified) Patient has no known allergies.   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   Thyroid  nodule 05/15/2013   Right side, IR bx 5/15: benign follicular nodule   TIA (transient ischemic attack)  Past Surgical History:  Procedure Laterality Date   BREAST BIOPSY Left 12/18/2021   FIBROCYSTIC CHANGES   CATARACT EXTRACTION  12/16/2008   MM BREAST STEREO BIOPSY LEFT ([facility] HX) Left 11/2021   Vetrectomy  02/16/2007   Family History  Problem Relation Age of Onset   Breast cancer Mother    Stroke Sister    Heart Problems Sister    Social History   Socioeconomic History   Marital status: Widowed    Spouse name: Not on file   Number of children: 3    Years of education: Not on file   Highest education level: Not on file  Occupational History   Not on file  Tobacco Use   Smoking status: Never   Smokeless tobacco: Never  Vaping Use   Vaping status: Never Used  Substance and Sexual Activity   Alcohol use: No   Drug use: No   Sexual activity: Not Currently    Partners: Male    Comment: 1st intercourse 83 yo-Fewer than 5 partners  Other Topics Concern   Not on file  Social History Narrative   Widowed. 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   Social Drivers of Health   Financial Resource Strain: Low Risk  (08/08/2023)   Overall Financial Resource Strain (CARDIA)    Difficulty of Paying Living Expenses: Not hard at all  Food Insecurity: No Food Insecurity (08/08/2023)   Hunger Vital Sign    Worried About Running Out of Food in the Last Year: Never true    Ran Out of Food in the Last Year: Never true  Transportation Needs: No Transportation Needs (08/08/2023)   PRAPARE - Administrator, Civil Service (Medical): No    Lack of Transportation (Non-Medical): No  Physical Activity: Insufficiently Active (08/08/2023)   Exercise Vital Sign    Days of Exercise per Week: 5 days    Minutes of Exercise per Session: 10 min  Stress: No Stress Concern Present (08/08/2023)   [employer] of Occupational Health - Occupational Stress Questionnaire    Feeling of Stress: Only a little  Social Connections: Moderately Integrated (08/08/2023)   Social Connection and Isolation Panel    Frequency of Communication with Friends and Family: More than three times a week    Frequency of Social Gatherings with Friends and Family: More than three times a week    Attends Religious Services: More than 4 times per year    Active Member of [employer] or Organizations: Yes    Attends Banker Meetings: More than 4 times per year    Marital  Status: Widowed    Tobacco Counseling Counseling given: No    Clinical Intake:  Pre-visit preparation completed: Yes  Pain : No/denies pain     BMI - recorded: 23.62 Nutritional Status: BMI of 19-24  Normal Nutritional Risks: None Diabetes: Yes CBG done?: No Did pt. bring in CBG monitor from home?: No  Lab Results  Component Value Date   HGBA1C 7.6 (H) 04/12/2023   HGBA1C 7.2 (H) 10/07/2022   HGBA1C 8.0 (H) 04/08/2022     How often do you need to have someone help you when you read instructions, pamphlets, or other written materials from your doctor or pharmacy?: 1 - Never  Interpreter Needed?: No  Information entered by :: Verdie Saba, CMA   Activities of Daily Living  08/08/2023   11:01 AM  In your present state of health, do you have any difficulty performing the following activities:  Hearing? 0  Vision? 0  Difficulty concentrating or making decisions? 0  Walking or climbing stairs? 0  Dressing or bathing? 0  Doing errands, shopping? 0  Preparing Food and eating ? N  Using the Toilet? N  In the past six months, have you accidently leaked urine? Y  Comment wears a pantyliner/pad  Do you have problems with loss of bowel control? N  Managing your Medications? N  Managing your Finances? N  Housekeeping or managing your Housekeeping? N    Patient Care Team: Geofm Glade PARAS, MD as PCP - General (Internal Medicine) Gay Olam BRAVO, MD (Ophthalmology) Kassie Mallick, MD (Inactive) (Endocrinology) Roark Rush, MD (Otolaryngology) Marilynne Rosaline SAILOR, MD as Consulting Physician (Gynecology) Shila Gustav GAILS, MD as Consulting Physician (Gastroenterology) Alvan Ronal BRAVO, MD (Inactive) as Consulting Physician (Cardiology)  I have updated your Care Teams any recent Medical Services you may have received from other providers in the past year.     Assessment:   This is a routine wellness examination for Brielynn.  Hearing/Vision screen Hearing  Screening - Comments:: Denies hearing difficulties   Vision Screening - Comments:: Wears rx glasses - Referral to Dr CANDIE Schneider   Goals Addressed               This Visit's Progress     Patient Stated (pt-stated)        Patient stated she needs to exercise more and watch diet due to being a Diabetic       Depression Screen     08/08/2023   11:05 AM 10/07/2022   10:56 AM 08/02/2022   11:09 AM 04/08/2022   10:45 AM 10/06/2021   10:47 AM 05/19/2021    2:25 PM 03/24/2021    2:13 PM  PHQ 2/9 Scores  PHQ - 2 Score 0 0 0 0 0 0 1  PHQ- 9 Score  0 0        Fall Risk     08/08/2023   11:02 AM 10/07/2022   10:56 AM 08/02/2022   11:10 AM 04/08/2022   10:45 AM 10/06/2021   10:46 AM  Fall Risk   Falls in the past year? 0 0 0 0 0  Number falls in past yr: 0 0 0 0 0  Injury with Fall? 0 0 0 0 0  Risk for fall due to : No Fall Risks No Fall Risks No Fall Risks No Fall Risks No Fall Risks  Follow up Falls evaluation completed;Falls prevention discussed Falls evaluation completed Falls prevention discussed Falls evaluation completed Falls evaluation completed      Data saved with a previous flowsheet row definition    MEDICARE RISK AT HOME:  Medicare Risk at Home Any stairs in or around the home?: Yes If so, are there any without handrails?: No Home free of loose throw rugs in walkways, pet beds, electrical cords, etc?: Yes Adequate lighting in your home to reduce risk of falls?: Yes Life alert?: No Use of a cane, walker or w/c?: No Grab bars in the bathroom?: No Shower chair or bench in shower?: Yes Elevated toilet seat or a handicapped toilet?: Yes  TIMED UP AND GO:  Was the test performed?  No  Cognitive Function: 6CIT completed    01/18/2017   10:08 AM  MMSE - Mini Mental State Exam  Orientation to time 5   Orientation  to Place 5   Registration 3   Attention/ Calculation 5   Recall 2   Language- name 2 objects 2   Language- repeat 1  Language- follow 3 step command 3    Language- read & follow direction 1   Write a sentence 1   Copy design 1   Total score 29      Data saved with a previous flowsheet row definition        08/08/2023   11:06 AM 08/02/2022   11:11 AM 05/19/2021    2:32 PM  6CIT Screen  What Year? 0 points 0 points 0 points  What month? 0 points 0 points 0 points  What time? 0 points 0 points 0 points  Count back from 20 0 points 0 points 0 points  Months in reverse 0 points 0 points 0 points  Repeat phrase 0 points 0 points 0 points  Total Score 0 points 0 points 0 points    Immunizations Immunization History  Administered Date(s) Administered   Hepatitis A 02/19/2012   Hepatitis B 02/19/2012   IPV 02/19/2012   Influenza Split 12/28/2011   Influenza, High Dose Seasonal PF 01/04/2013, 11/05/2013, 12/03/2015, 01/19/2018   Influenza,inj,Quad PF,6+ Mos 11/26/2014, 11/16/2018   Influenza-Unspecified 11/15/2016   Meningococcal polysaccharide vaccine (MPSV4) 02/19/2012   PFIZER(Purple Top)SARS-COV-2 Vaccination 09/01/2019, 09/25/2019   Pneumococcal Conjugate-13 05/07/2014   Pneumococcal Polysaccharide-23 12/28/2011   Td 02/16/2003, 11/05/2013   Tdap 11/30/2019   Typhoid Parenteral 02/19/2012    Screening Tests Health Maintenance  Topic Date Due   Zoster Vaccines- Shingrix (1 of 2) Never done   COVID-19 Vaccine (3 - 2024-25 season) 10/17/2022   OPHTHALMOLOGY EXAM  12/04/2022   INFLUENZA VACCINE  09/16/2023   Diabetic kidney evaluation - Urine ACR  10/07/2023   HEMOGLOBIN A1C  10/10/2023   Diabetic kidney evaluation - eGFR measurement  04/11/2024   FOOT EXAM  06/22/2024   Medicare Annual Wellness (AWV)  08/07/2024   DEXA SCAN  05/04/2027   DTaP/Tdap/Td (4 - Td or Tdap) 11/29/2029   Pneumococcal Vaccine: 50+ Years  Completed   HPV VACCINES  Aged Out   Meningococcal B Vaccine  Aged Out    Health Maintenance  Health Maintenance Due  Topic Date Due   Zoster Vaccines- Shingrix (1 of 2) Never done   COVID-19 Vaccine (3  - 2024-25 season) 10/17/2022   OPHTHALMOLOGY EXAM  12/04/2022   Health Maintenance Items Addressed: 08/08/2023   Additional Screening:  Vision Screening: Recommended annual ophthalmology exams for early detection of glaucoma and other disorders of the eye. Would you like a referral to an eye doctor? Yes  - referral to Dr. CANDIE Schneider of [facility]  Dental Screening: Recommended annual dental exams for proper oral hygiene  Community Resource Referral / Chronic Care Management: CRR required this visit?  No   CCM required this visit?  No   Plan:    I have personally reviewed and noted the following in the patient's chart:   Medical and social history Use of alcohol, tobacco or illicit drugs  Current medications and supplements including opioid prescriptions. Patient is not currently taking opioid prescriptions. Functional ability and status Nutritional status Physical activity Advanced directives List of other physicians Hospitalizations, surgeries, and ER visits in previous 12 months Vitals Screenings to include cognitive, depression, and falls Referrals and appointments  In addition, I have reviewed and discussed with patient certain preventive protocols, quality metrics, and best practice recommendations. A written personalized care plan  for preventive services as well as general preventive health recommendations were provided to patient.   Verdie CHRISTELLA Saba, CMA   08/08/2023   After Visit Summary: (MyChart) Due to this being a telephonic visit, the after visit summary with patients personalized plan was offered to patient via MyChart   Notes: Nothing significant to report at this time.

## 2023-08-22 DIAGNOSIS — N823 Fistula of vagina to large intestine: Secondary | ICD-10-CM | POA: Diagnosis present

## 2023-08-23 ENCOUNTER — Ambulatory Visit

## 2023-08-24 ENCOUNTER — Ambulatory Visit
Admission: RE | Admit: 2023-08-24 | Discharge: 2023-08-24 | Disposition: A | Discharge status: Home / Self Care | Source: Ambulatory Visit | Attending: Internal Medicine

## 2023-08-24 DIAGNOSIS — Z1231 Encounter for screening mammogram for malignant neoplasm of breast: Secondary | ICD-10-CM | POA: Diagnosis not present

## 2023-08-29 DIAGNOSIS — E119 Type 2 diabetes mellitus without complications: Secondary | ICD-10-CM | POA: Diagnosis not present

## 2023-08-29 DIAGNOSIS — N398 Other specified disorders of urinary system: Secondary | ICD-10-CM | POA: Diagnosis not present

## 2023-10-10 ENCOUNTER — Encounter: Payer: Self-pay | Admitting: Internal Medicine

## 2023-10-10 NOTE — Progress Notes (Unsigned)
 Subjective:    Patient ID: Patricia Mccullough, female    DOB: November 27, 1940, 83 y.o.   MRN: [id number]     HPI Patricia Mccullough is here for follow up of her chronic medical problems.  Unfortunately her husband passed away last 2023-04-17.  She feels she is doing okay, but it has been difficult.  She has not been eating as healthy as she should be eating.  She is taking her medications.  Medications and allergies reviewed with patient and updated if appropriate.  Current Outpatient Medications on File Prior to Visit  Medication Sig Dispense Refill   amLODipine  (NORVASC ) 5 MG tablet TAKE 1 TABLET BY MOUTH ONCE DAILY . 90 tablet 2   aspirin  EC 325 MG tablet Take 1 tablet (325 mg total) by mouth daily. 100 tablet 3   atorvastatin  (LIPITOR) 20 MG tablet Take 1 tablet (20 mg total) by mouth daily. 90 tablet 2   Continuous Glucose Sensor (FREESTYLE LIBRE 2 SENSOR) MISC USE TO CHECK BLOOD SUGAR 4 TIMES DAILY 2 each 5   fluticasone  (FLONASE ) 50 MCG/ACT nasal spray Place 1 spray into both nostrils 2 (two) times daily as needed for allergies or rhinitis.     JARDIANCE  10 MG TABS tablet Take 1 tablet (10 mg total) by mouth every morning. 90 tablet 2   metFORMIN  (GLUCOPHAGE ) 1000 MG tablet Take 1 tablet (1,000 mg total) by mouth 2 (two) times daily with a meal. 180 tablet 1   SYSTANE BALANCE 0.6 % SOLN Place 1 drop into both eyes in the morning, at noon, in the evening, and at bedtime.     No current facility-administered medications on file prior to visit.     Review of Systems  Constitutional:  Negative for appetite change and fever.  Respiratory:  Positive for cough (occ coughing spells - not daily). Negative for shortness of breath and wheezing.   Cardiovascular:  Negative for chest pain, palpitations and leg swelling.  Neurological:  Positive for headaches (occ). Negative for light-headedness.  Psychiatric/Behavioral:  Negative for sleep disturbance.        Objective:   Vitals:   10/11/23  1027  BP: 124/70  Pulse: 78  Temp: 98 F (36.7 C)  SpO2: 100%   BP Readings from Last 3 Encounters:  10/11/23 124/70  05/19/23 (!) 165/82  05/06/23 138/68   Wt Readings from Last 3 Encounters:  10/11/23 137 lb (62.1 kg)  08/08/23 125 lb (56.7 kg)  05/19/23 125 lb (56.7 kg)   Body mass index is 25.89 kg/m.    Physical Exam Constitutional:      General: She is not in acute distress.    Appearance: Normal appearance.  HENT:     Head: Normocephalic and atraumatic.  Eyes:     Conjunctiva/sclera: Conjunctivae normal.  Cardiovascular:     Rate and Rhythm: Normal rate and regular rhythm.     Heart sounds: Murmur (2/6 sys) heard.  Pulmonary:     Effort: Pulmonary effort is normal. No respiratory distress.     Breath sounds: Normal breath sounds. No wheezing.  Musculoskeletal:     Cervical back: Neck supple.     Right lower leg: No edema.     Left lower leg: No edema.  Lymphadenopathy:     Cervical: No cervical adenopathy.  Skin:    General: Skin is warm and dry.     Findings: No rash.  Neurological:     Mental Status: She is alert. Mental status is at  baseline.  Psychiatric:        Mood and Affect: Mood normal.        Behavior: Behavior normal.       Diabetic Foot Exam - Simple   Simple Foot Form Diabetic Foot exam was performed with the following findings: Yes 10/11/2023 12:30 PM  Visual Inspection No deformities, no ulcerations, no other skin breakdown bilaterally: Yes Sensation Testing Intact to touch and monofilament testing bilaterally: Yes Pulse Check Posterior Tibialis and Dorsalis pulse intact bilaterally: Yes Comments      Lab Results  Component Value Date   WBC 6.5 04/12/2023   HGB 12.7 04/12/2023   HCT 39.3 04/12/2023   PLT 213.0 04/12/2023   GLUCOSE 185 (H) 04/12/2023   CHOL 107 04/12/2023   TRIG 143.0 04/12/2023   HDL 61.70 04/12/2023   LDLDIRECT 43.0 10/06/2021   LDLCALC 17 04/12/2023   ALT 15 04/12/2023   AST 14 04/12/2023   NA 142  04/12/2023   K 4.2 04/12/2023   CL 106 04/12/2023   CREATININE 1.05 04/12/2023   BUN 24 (H) 04/12/2023   CO2 29 04/12/2023   TSH 1.78 09/12/2020   HGBA1C 7.6 (H) 04/12/2023     Assessment & Plan:    See Problem List for Assessment and Plan of chronic medical problems.

## 2023-10-10 NOTE — Patient Instructions (Addendum)
      Blood work was ordered.       Medications changes include :   None    A referral was ordered and someone will call you to schedule an appointment.     Return in about 6 months (around 04/12/2024) for Physical Exam.

## 2023-10-10 NOTE — Assessment & Plan Note (Signed)
 Chronic Lab Results  Component Value Date   HGBA1C 7.6 (H) 04/12/2023    Sugars not ideally controlled Recently has not been eating as good because of increased stress Testing sugars 1 times a day Check A1c  Continue Jardiance  10 mg daily, metformin  1000 mg twice daily Stressed regular exercise, diabetic diet

## 2023-10-11 ENCOUNTER — Ambulatory Visit (INDEPENDENT_AMBULATORY_CARE_PROVIDER_SITE_OTHER): Payer: Medicare Other | Admitting: Internal Medicine

## 2023-10-11 VITALS — BP 124/70 | HR 78 | Temp 98.0°F | Ht 61.0 in | Wt 137.0 lb

## 2023-10-11 DIAGNOSIS — F4321 Adjustment disorder with depressed mood: Secondary | ICD-10-CM

## 2023-10-11 DIAGNOSIS — Z7984 Long term (current) use of oral hypoglycemic drugs: Secondary | ICD-10-CM | POA: Diagnosis not present

## 2023-10-11 DIAGNOSIS — E785 Hyperlipidemia, unspecified: Secondary | ICD-10-CM | POA: Diagnosis not present

## 2023-10-11 DIAGNOSIS — I152 Hypertension secondary to endocrine disorders: Secondary | ICD-10-CM | POA: Diagnosis not present

## 2023-10-11 DIAGNOSIS — E1159 Type 2 diabetes mellitus with other circulatory complications: Secondary | ICD-10-CM | POA: Diagnosis not present

## 2023-10-11 DIAGNOSIS — E1169 Type 2 diabetes mellitus with other specified complication: Secondary | ICD-10-CM

## 2023-10-11 DIAGNOSIS — E113299 Type 2 diabetes mellitus with mild nonproliferative diabetic retinopathy without macular edema, unspecified eye: Secondary | ICD-10-CM | POA: Diagnosis not present

## 2023-10-11 DIAGNOSIS — Z8673 Personal history of transient ischemic attack (TIA), and cerebral infarction without residual deficits: Secondary | ICD-10-CM

## 2023-10-11 LAB — CBC WITH DIFFERENTIAL/PLATELET
Basophils Absolute: 0.1 K/uL (ref 0.0–0.1)
Basophils Relative: 0.9 % (ref 0.0–3.0)
Eosinophils Absolute: 0.4 K/uL (ref 0.0–0.7)
Eosinophils Relative: 7.8 % — ABNORMAL HIGH (ref 0.0–5.0)
HCT: 39.3 % (ref 36.0–46.0)
Hemoglobin: 12.9 g/dL (ref 12.0–15.0)
Lymphocytes Relative: 39.4 % (ref 12.0–46.0)
Lymphs Abs: 2.2 K/uL (ref 0.7–4.0)
MCHC: 32.8 g/dL (ref 30.0–36.0)
MCV: 94.6 fl (ref 78.0–100.0)
Monocytes Absolute: 0.6 K/uL (ref 0.1–1.0)
Monocytes Relative: 10.3 % (ref 3.0–12.0)
Neutro Abs: 2.3 K/uL (ref 1.4–7.7)
Neutrophils Relative %: 41.6 % — ABNORMAL LOW (ref 43.0–77.0)
Platelets: 196 K/uL (ref 150.0–400.0)
RBC: 4.16 Mil/uL (ref 3.87–5.11)
RDW: 15.1 % (ref 11.5–15.5)
WBC: 5.5 K/uL (ref 4.0–10.5)

## 2023-10-11 LAB — LIPID PANEL
Cholesterol: 119 mg/dL (ref 0–200)
HDL: 77 mg/dL (ref >39.00)
LDL Cholesterol: 24 mg/dL (ref 0–99)
NonHDL: 41.93
Total CHOL/HDL Ratio: 2
Triglycerides: 91 mg/dL (ref 0.0–149.0)
VLDL: 18.2 mg/dL (ref 0.0–40.0)

## 2023-10-11 LAB — COMPREHENSIVE METABOLIC PANEL WITH GFR
ALT: 27 U/L (ref 0–35)
AST: 24 U/L (ref 0–37)
Albumin: 4 g/dL (ref 3.5–5.2)
Alkaline Phosphatase: 63 U/L (ref 39–117)
BUN: 29 mg/dL — ABNORMAL HIGH (ref 6–23)
CO2: 30 meq/L (ref 19–32)
Calcium: 9.6 mg/dL (ref 8.4–10.5)
Chloride: 101 meq/L (ref 96–112)
Creatinine, Ser: 0.9 mg/dL (ref 0.40–1.20)
GFR: 59.72 mL/min — ABNORMAL LOW (ref >60.00)
Glucose, Bld: 132 mg/dL — ABNORMAL HIGH (ref 70–99)
Potassium: 4.2 meq/L (ref 3.5–5.1)
Sodium: 140 meq/L (ref 135–145)
Total Bilirubin: 0.5 mg/dL (ref 0.2–1.2)
Total Protein: 7.6 g/dL (ref 6.0–8.3)

## 2023-10-11 LAB — MICROALBUMIN / CREATININE URINE RATIO
Creatinine,U: 50.7 mg/dL
Microalb Creat Ratio: 49.4 mg/g — ABNORMAL HIGH (ref 0.0–30.0)
Microalb, Ur: 2.5 mg/dL — ABNORMAL HIGH (ref 0.0–1.9)

## 2023-10-11 LAB — HEMOGLOBIN A1C: Hgb A1c MFr Bld: 7.8 % — ABNORMAL HIGH (ref 4.6–6.5)

## 2023-10-11 NOTE — Assessment & Plan Note (Signed)
Chronic Regular exercise and healthy diet encouraged Check lipid panel, CMP Continue atorvastatin 20 mg daily 

## 2023-10-11 NOTE — Assessment & Plan Note (Signed)
 New Since she was here last her husband has died and she is grieving his death, but overall doing well. Discussed that if she feels like she needs to talk to anyone or if in need of medication she should call

## 2023-10-11 NOTE — Assessment & Plan Note (Signed)
 Chronic History of TIA Continue aspirin daily Continue atorvastatin 20 mg daily Stressed good blood pressure, sugar control

## 2023-10-11 NOTE — Assessment & Plan Note (Signed)
 Chronic Blood pressure initially elevated, better on repeat CMP, CBC Continue amlodipine 5 mg daily

## 2023-10-11 NOTE — Assessment & Plan Note (Signed)
Chronic She is up-to-date with her eye exams Stressed getting better control of her sugars

## 2023-10-13 ENCOUNTER — Ambulatory Visit: Payer: Self-pay | Admitting: Internal Medicine

## 2023-11-03 ENCOUNTER — Other Ambulatory Visit: Payer: Self-pay | Admitting: Internal Medicine

## 2023-11-11 ENCOUNTER — Emergency Department (HOSPITAL_COMMUNITY)

## 2023-11-11 ENCOUNTER — Other Ambulatory Visit: Payer: Self-pay

## 2023-11-11 ENCOUNTER — Inpatient Hospital Stay (HOSPITAL_COMMUNITY)
Admission: EM | Admit: 2023-11-11 | Discharge: 2023-11-15 | DRG: 065 | Disposition: A | Discharge status: Skilled Nursing Facility | Attending: Internal Medicine | Admitting: Internal Medicine

## 2023-11-11 ENCOUNTER — Encounter (HOSPITAL_COMMUNITY): Payer: Self-pay | Admitting: Family Medicine

## 2023-11-11 DIAGNOSIS — E1122 Type 2 diabetes mellitus with diabetic chronic kidney disease: Secondary | ICD-10-CM | POA: Diagnosis present

## 2023-11-11 DIAGNOSIS — G9340 Encephalopathy, unspecified: Secondary | ICD-10-CM | POA: Diagnosis not present

## 2023-11-11 DIAGNOSIS — Z7982 Long term (current) use of aspirin: Secondary | ICD-10-CM

## 2023-11-11 DIAGNOSIS — I152 Hypertension secondary to endocrine disorders: Secondary | ICD-10-CM | POA: Diagnosis not present

## 2023-11-11 DIAGNOSIS — R4701 Aphasia: Secondary | ICD-10-CM | POA: Diagnosis present

## 2023-11-11 DIAGNOSIS — I447 Left bundle-branch block, unspecified: Secondary | ICD-10-CM | POA: Diagnosis present

## 2023-11-11 DIAGNOSIS — N179 Acute kidney failure, unspecified: Secondary | ICD-10-CM | POA: Diagnosis not present

## 2023-11-11 DIAGNOSIS — Z803 Family history of malignant neoplasm of breast: Secondary | ICD-10-CM | POA: Diagnosis not present

## 2023-11-11 DIAGNOSIS — Z7984 Long term (current) use of oral hypoglycemic drugs: Secondary | ICD-10-CM | POA: Diagnosis not present

## 2023-11-11 DIAGNOSIS — R4182 Altered mental status, unspecified: Principal | ICD-10-CM

## 2023-11-11 DIAGNOSIS — N1831 Chronic kidney disease, stage 3a: Secondary | ICD-10-CM | POA: Diagnosis present

## 2023-11-11 DIAGNOSIS — E1169 Type 2 diabetes mellitus with other specified complication: Secondary | ICD-10-CM | POA: Diagnosis present

## 2023-11-11 DIAGNOSIS — I6340 Cerebral infarction due to embolism of unspecified cerebral artery: Principal | ICD-10-CM | POA: Diagnosis present

## 2023-11-11 DIAGNOSIS — N390 Urinary tract infection, site not specified: Secondary | ICD-10-CM | POA: Diagnosis not present

## 2023-11-11 DIAGNOSIS — Z823 Family history of stroke: Secondary | ICD-10-CM | POA: Diagnosis not present

## 2023-11-11 DIAGNOSIS — E1159 Type 2 diabetes mellitus with other circulatory complications: Secondary | ICD-10-CM | POA: Diagnosis not present

## 2023-11-11 DIAGNOSIS — R29703 NIHSS score 3: Secondary | ICD-10-CM | POA: Diagnosis present

## 2023-11-11 DIAGNOSIS — G9349 Other encephalopathy: Secondary | ICD-10-CM | POA: Diagnosis present

## 2023-11-11 DIAGNOSIS — Z8041 Family history of malignant neoplasm of ovary: Secondary | ICD-10-CM

## 2023-11-11 DIAGNOSIS — G9389 Other specified disorders of brain: Secondary | ICD-10-CM | POA: Diagnosis not present

## 2023-11-11 DIAGNOSIS — E785 Hyperlipidemia, unspecified: Secondary | ICD-10-CM | POA: Diagnosis not present

## 2023-11-11 DIAGNOSIS — G319 Degenerative disease of nervous system, unspecified: Secondary | ICD-10-CM | POA: Diagnosis not present

## 2023-11-11 DIAGNOSIS — I639 Cerebral infarction, unspecified: Secondary | ICD-10-CM | POA: Diagnosis present

## 2023-11-11 DIAGNOSIS — S0003XA Contusion of scalp, initial encounter: Secondary | ICD-10-CM | POA: Diagnosis not present

## 2023-11-11 DIAGNOSIS — E113299 Type 2 diabetes mellitus with mild nonproliferative diabetic retinopathy without macular edema, unspecified eye: Secondary | ICD-10-CM | POA: Diagnosis present

## 2023-11-11 DIAGNOSIS — R319 Hematuria, unspecified: Secondary | ICD-10-CM | POA: Diagnosis not present

## 2023-11-11 DIAGNOSIS — N823 Fistula of vagina to large intestine: Secondary | ICD-10-CM | POA: Diagnosis present

## 2023-11-11 DIAGNOSIS — R3915 Urgency of urination: Secondary | ICD-10-CM | POA: Diagnosis not present

## 2023-11-11 DIAGNOSIS — I6200 Nontraumatic subdural hemorrhage, unspecified: Secondary | ICD-10-CM | POA: Diagnosis not present

## 2023-11-11 DIAGNOSIS — W010XXA Fall on same level from slipping, tripping and stumbling without subsequent striking against object, initial encounter: Secondary | ICD-10-CM | POA: Diagnosis present

## 2023-11-11 DIAGNOSIS — Z8673 Personal history of transient ischemic attack (TIA), and cerebral infarction without residual deficits: Secondary | ICD-10-CM | POA: Diagnosis not present

## 2023-11-11 DIAGNOSIS — Z79899 Other long term (current) drug therapy: Secondary | ICD-10-CM | POA: Diagnosis not present

## 2023-11-11 DIAGNOSIS — Y92009 Unspecified place in unspecified non-institutional (private) residence as the place of occurrence of the external cause: Secondary | ICD-10-CM

## 2023-11-11 LAB — URINALYSIS, W/ REFLEX TO CULTURE (INFECTION SUSPECTED)
Bilirubin Urine: NEGATIVE
Glucose, UA: >=500 mg/dL — AB
Hgb urine dipstick: NEGATIVE
Ketones, ur: NEGATIVE mg/dL
Nitrite: NEGATIVE
Protein, ur: NEGATIVE mg/dL
Specific Gravity, Urine: 1.009 (ref 1.005–1.030)
WBC, UA: >50 WBC/hpf (ref 0–5)
pH: 6 (ref 5.0–8.0)

## 2023-11-11 LAB — URINE DRUG SCREEN
Amphetamines: NEGATIVE
Barbiturates: NEGATIVE
Benzodiazepines: NEGATIVE
Cocaine: NEGATIVE
Fentanyl: NEGATIVE
Methadone Scn, Ur: NEGATIVE
Opiates: NEGATIVE
Tetrahydrocannabinol: NEGATIVE

## 2023-11-11 LAB — DIFFERENTIAL
Abs Immature Granulocytes: 0.02 K/uL (ref 0.00–0.07)
Basophils Absolute: 0 K/uL (ref 0.0–0.1)
Basophils Relative: 1 %
Eosinophils Absolute: 0.2 K/uL (ref 0.0–0.5)
Eosinophils Relative: 3 %
Immature Granulocytes: 0 %
Lymphocytes Relative: 27 %
Lymphs Abs: 2.3 K/uL (ref 0.7–4.0)
Monocytes Absolute: 0.7 K/uL (ref 0.1–1.0)
Monocytes Relative: 8 %
Neutro Abs: 5.2 K/uL (ref 1.7–7.7)
Neutrophils Relative %: 61 %

## 2023-11-11 LAB — COMPREHENSIVE METABOLIC PANEL WITH GFR
ALT: 15 U/L (ref 0–44)
AST: 23 U/L (ref 15–41)
Albumin: 3.7 g/dL (ref 3.5–5.0)
Alkaline Phosphatase: 130 U/L — ABNORMAL HIGH (ref 38–126)
Anion gap: 13 (ref 5–15)
BUN: 30 mg/dL — ABNORMAL HIGH (ref 8–23)
CO2: 22 mmol/L (ref 22–32)
Calcium: 9.5 mg/dL (ref 8.9–10.3)
Chloride: 104 mmol/L (ref 98–111)
Creatinine, Ser: 1.34 mg/dL — ABNORMAL HIGH (ref 0.44–1.00)
GFR, Estimated: 39 mL/min — ABNORMAL LOW (ref >60)
Glucose, Bld: 262 mg/dL — ABNORMAL HIGH (ref 70–99)
Potassium: 3.9 mmol/L (ref 3.5–5.1)
Sodium: 139 mmol/L (ref 135–145)
Total Bilirubin: 0.8 mg/dL (ref 0.0–1.2)
Total Protein: 7.3 g/dL (ref 6.5–8.1)

## 2023-11-11 LAB — CBC
HCT: 39.1 % (ref 36.0–46.0)
Hemoglobin: 12.3 g/dL (ref 12.0–15.0)
MCH: 30.3 pg (ref 26.0–34.0)
MCHC: 31.5 g/dL (ref 30.0–36.0)
MCV: 96.3 fL (ref 80.0–100.0)
Platelets: 236 K/uL (ref 150–400)
RBC: 4.06 MIL/uL (ref 3.87–5.11)
RDW: 14 % (ref 11.5–15.5)
WBC: 8.5 K/uL (ref 4.0–10.5)
nRBC: 0 % (ref 0.0–0.2)

## 2023-11-11 LAB — RESP PANEL BY RT-PCR (RSV, FLU A&B, COVID)  RVPGX2
Influenza A by PCR: NEGATIVE
Influenza B by PCR: NEGATIVE
Resp Syncytial Virus by PCR: NEGATIVE
SARS Coronavirus 2 by RT PCR: NEGATIVE

## 2023-11-11 LAB — APTT: aPTT: 28 s (ref 24–36)

## 2023-11-11 LAB — TSH: TSH: 0.614 u[IU]/mL (ref 0.350–4.500)

## 2023-11-11 LAB — MAGNESIUM: Magnesium: 2.1 mg/dL (ref 1.7–2.4)

## 2023-11-11 LAB — PROTIME-INR
INR: 1.1 (ref 0.8–1.2)
Prothrombin Time: 14.5 s (ref 11.4–15.2)

## 2023-11-11 LAB — CBG MONITORING, ED: Glucose-Capillary: 125 mg/dL — ABNORMAL HIGH (ref 70–99)

## 2023-11-11 LAB — ETHANOL: Alcohol, Ethyl (B): <15 mg/dL (ref <15)

## 2023-11-11 LAB — VITAMIN B12: Vitamin B-12: 1157 pg/mL — ABNORMAL HIGH (ref 180–914)

## 2023-11-11 MED ORDER — ACETAMINOPHEN 325 MG PO TABS
650.0000 mg | ORAL_TABLET | Freq: Four times a day (QID) | ORAL | Status: DC | PRN
  Administered 2023-11-13 – 2023-11-15 (×2): 650 mg via ORAL
  Filled 2023-11-11 (×2): qty 2

## 2023-11-11 MED ORDER — ATORVASTATIN CALCIUM 10 MG PO TABS
20.0000 mg | ORAL_TABLET | Freq: Every day | ORAL | Status: DC
  Administered 2023-11-12 – 2023-11-15 (×4): 20 mg via ORAL
  Filled 2023-11-11 (×4): qty 2

## 2023-11-11 MED ORDER — LACTATED RINGERS IV BOLUS
1000.0000 mL | Freq: Once | INTRAVENOUS | Status: AC
  Administered 2023-11-11: 1000 mL via INTRAVENOUS

## 2023-11-11 MED ORDER — ONDANSETRON HCL 4 MG PO TABS: 4.0000 mg | ORAL_TABLET | Freq: Four times a day (QID) | ORAL | Status: DC | PRN

## 2023-11-11 MED ORDER — ASPIRIN 325 MG PO TBEC
325.0000 mg | DELAYED_RELEASE_TABLET | Freq: Every day | ORAL | Status: DC
  Administered 2023-11-12 – 2023-11-15 (×4): 325 mg via ORAL
  Filled 2023-11-11 (×4): qty 1

## 2023-11-11 MED ORDER — SODIUM CHLORIDE 0.9 % IV SOLN
INTRAVENOUS | Status: DC
Start: 2023-11-11 — End: 2023-11-11

## 2023-11-11 MED ORDER — SODIUM CHLORIDE 0.9 % IV SOLN
1.0000 g | Freq: Once | INTRAVENOUS | Status: AC
  Administered 2023-11-11: 1 g via INTRAVENOUS
  Filled 2023-11-11: qty 10

## 2023-11-11 MED ORDER — SODIUM CHLORIDE 0.9 % IV SOLN: INTRAVENOUS | Status: AC

## 2023-11-11 MED ORDER — ONDANSETRON HCL 4 MG/2ML IJ SOLN: 4.0000 mg | Freq: Four times a day (QID) | INTRAMUSCULAR | Status: DC | PRN

## 2023-11-11 MED ORDER — CEFTRIAXONE SODIUM 1 G IJ SOLR
1.0000 g | INTRAMUSCULAR | Status: DC
  Administered 2023-11-12: 1 g via INTRAVENOUS
  Filled 2023-11-11: qty 10

## 2023-11-11 MED ORDER — ACETAMINOPHEN 650 MG RE SUPP: 650.0000 mg | Freq: Four times a day (QID) | RECTAL | Status: DC | PRN

## 2023-11-11 MED ORDER — INSULIN ASPART 100 UNIT/ML IJ SOLN
0.0000 [IU] | Freq: Three times a day (TID) | INTRAMUSCULAR | Status: DC
  Administered 2023-11-12 – 2023-11-15 (×10): 2 [IU] via SUBCUTANEOUS
  Filled 2023-11-11: qty 0.09

## 2023-11-11 NOTE — Assessment & Plan Note (Addendum)
 History of large rectovaginal fistula Complains of frequency/urgency but states not acute Check bladder scan. Strict I/O  Culture pending Continue rocephin 

## 2023-11-11 NOTE — ED Triage Notes (Signed)
 BIB family for increased confusion and concern for stroke. Symptoms started yesterday and patient had incoherent speech. EMS was called yesterday for eval - CBG was elevated at 242. Spoke with daughter yesterday and patient was frantic. Patient lives with her disabled son and she normally takes care of him. C/o not feeling well on Tuesday. Felt better on Wednesday but had a HA. Then Thursday AMS symptoms.

## 2023-11-11 NOTE — TOC Progression Note (Signed)
 Transition of Care [facility]) - Progression Note    Patient Details  Name: Patricia Mccullough MRN: [id number] Date of Birth: [birth year]  Transition of [facility]) CM/SW Contact  Lorraine LILLETTE [place], [place] Phone Number: 11/11/2023, 5:33 PM  Clinical Narrative:    CSW called daughter Elouise at her request- via [place] from RN. Daughter resides in [place] - she is worried that mom may need more care- possibly admission then SNF She will need time to plan- mother cares for a disabled son- daughter is making arrangements now for her brothers care today.                       Expected Discharge Plan and Services                                               Social Drivers of Health (SDOH) Interventions SDOH Screenings   Food Insecurity: No Food Insecurity (08/08/2023)  Housing: Unknown (08/08/2023)  Transportation Needs: No Transportation Needs (08/08/2023)  Utilities: Not At Risk (08/08/2023)  Alcohol Screen: Low Risk  (08/08/2023)  Depression (PHQ2-9): Medium Risk (10/11/2023)  Financial Resource Strain: Low Risk  (08/08/2023)  Physical Activity: Insufficiently Active (08/08/2023)  Social Connections: Moderately Integrated (08/08/2023)  Stress: No Stress Concern Present (08/08/2023)  Tobacco Use: Low Risk  (10/10/2023)  Health Literacy: Adequate Health Literacy (08/08/2023)    Readmission Risk Interventions     No data to display

## 2023-11-11 NOTE — Assessment & Plan Note (Signed)
Continue lipitor 20mg daily  

## 2023-11-11 NOTE — Assessment & Plan Note (Signed)
 Followed at [facility], from pessary trauma  Colorectal surgical approach to this defect would be either a simple loop colostomy or a proctectomy with end colostomy. She does not want this.  - She plans to reach out to Dr Sherma team to discuss options for symptoms of uterovaginal prolapse independent of colostomy.

## 2023-11-11 NOTE — Assessment & Plan Note (Signed)
 A1C in August was 7.8 Hold jardiance  in setting of possible UTI Hold metformin  Sensitive SSI and accuchecks qac/hs

## 2023-11-11 NOTE — ED Notes (Signed)
 1 set of blood cultures drawn. Order placed after antibiotics were administered. MD aware.

## 2023-11-11 NOTE — H&P (Signed)
 History and Physical    Patient: Patricia Mccullough:[id number] DOB: May 18, 1940 DOA: 11/11/2023 DOS: the patient was seen and examined on 11/11/2023 PCP: Geofm Glade PARAS, MD  Patient coming from: Home - lives and takes care of her disabled son. Ambulates independently.     Chief Complaint: AMS/aphasia   HPI: Patricia Mccullough is a 83 y.o. female with medical history significant of T2DM, HTN, HLD, hx of TIA, rectovaginal fistula and allergic urticaria who presented to ED with complaints of AMS and speech difficulty.  She doesn't really recall why she is here. Her daughter gives the history who talks to her on the phone nearly daily. She was her normal self on Monday and Tuesday. She did seem to repeat herself a lot on Tuesday, but she still sounded like her normal self. On Wednesday she kept repeating the same thing over and over again. She kept telling her she needed to get to the bank. Very atypical. She was also more agitated on Wednesday. EMS was called and she refused to go to ED. Today her daughter was more persistent that she go to the hospital and she gave in and came to ED. She still didn't sound like her normal self.    Denies any fever/chills, vision changes/headaches, chest pain or palpitations, shortness of breath or cough, abdominal pain, N/V/D, dysuria or leg swelling. She does report increased urgency and frequency, but doesn't think this is acute. She also feels like she is not completely emptying her bladder. She has not been eating and drinking well. Daughter reports she gets easily distracted.    She does not smoke or drink alcohol.   ER Course:  vitals: afebrile, bp: 93/48, HR: 81, RR: 18, oxygen: 98%RA Pertinent labs: BUN: 30, creatinine: 1.34, UA: large LE, >50 WBC, many bacteria CXR: no active disease CT head: no acute finding, stable ventricular enlargement. Stable diffuse white matter changes ,likely sequelae of chronic microvascular ischemia.  In ED: given  rocephin , IVF, MRI brain ordered but can not be done tonight and [facility] asked to admit.    Review of Systems: As mentioned in the history of present illness. All other systems reviewed and are negative. 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 She has never used smokeless tobacco. She reports that she does not drink alcohol and does not use drugs.  No Known Allergies  Family History  Problem Relation Age of Onset   Breast cancer Mother 25 - 60   Stroke Sister    Heart Problems Sister    Ovarian cancer Maternal Aunt 78 - 69    Prior to Admission medications   Medication Sig Start Date End Date Taking? Authorizing Provider  amLODipine  (NORVASC ) 5 MG tablet TAKE 1 TABLET BY MOUTH ONCE DAILY . 04/12/23   Geofm Glade PARAS, MD  aspirin  EC 325 MG tablet  Take 1 tablet (325 mg total) by mouth daily. 05/08/13   Inocencio Berwyn LABOR, MD  atorvastatin  (LIPITOR) 20 MG tablet Take 1 tablet (20 mg total) by mouth daily. 04/12/23 04/11/24  Geofm Glade PARAS, MD  Continuous Glucose Sensor (FREESTYLE LIBRE 2 SENSOR) MISC USE TO CHECK BLOOD SUGAR 4 TIMES DAILY 05/11/23   Geofm Glade PARAS, MD  fluticasone  (FLONASE ) 50 MCG/ACT nasal spray Place 1 spray into both nostrils 2 (two) times daily as needed for allergies or rhinitis.    [provider]  JARDIANCE  10 MG TABS tablet Take 1 tablet (10 mg total) by mouth every  morning. 04/12/23   Burns, Glade PARAS, MD  metFORMIN  (GLUCOPHAGE ) 1000 MG tablet TAKE 1 TABLET BY MOUTH TWICE DAILY WITH  A  MEAL 11/03/23   Burns, Glade PARAS, MD  SYSTANE BALANCE 0.6 % SOLN Place 1 drop into both eyes in the morning, at noon, in the evening, and at bedtime. 07/15/21   [provider]    Physical Exam: Vitals:   11/11/23 1600 11/11/23 1705 11/11/23 2045 11/11/23 2109  BP: (!) 145/65  (!) 151/70   Pulse: 65  69   Resp: 18  18   Temp:  98.2 F (36.8 C)  98.1 F (36.7 C)  TempSrc:  Oral  Oral  SpO2: 93%  98%    General:  Appears calm and comfortable and is in NAD Eyes:  PERRL, EOMI, normal lids, iris ENT:  grossly normal hearing, lips & tongue, mmm; appropriate dentition Neck:  no LAD, masses or thyromegaly; no carotid bruits Cardiovascular:  RRR, +systolic murmur.  No LE edema.  Respiratory:   CTA bilaterally with no wheezes/rales/rhonchi.  Normal respiratory effort. Abdomen:  soft, NT, ND, NABS Back:   normal alignment, no CVAT Skin:  no rash or induration seen on limited exam Musculoskeletal:  grossly normal tone BUE/BLE, good ROM, no bony abnormality Lower extremity:  No LE edema.  Limited foot exam with no ulcerations.  2+ distal pulses. Psychiatric:  slow mood and affect, speech fluent and appropriate, but periods of not being able to get her words out, AOx2 Neurologic:  CN 2-12 grossly intact, moves all extremities in coordinated fashion, sensation intact. Periods of confusion    Radiological Exams on Admission: Independently reviewed - see discussion in A/P where applicable  DG Chest Port 1 View Result Date: 11/11/2023 CLINICAL DATA:  Altered mental status. EXAM: PORTABLE CHEST 1 VIEW COMPARISON:  November 27, 2021 FINDINGS: The heart size and mediastinal contours are within normal limits. Both lungs are clear. The visualized skeletal structures are unremarkable. IMPRESSION: No active disease. Electronically Signed   By: Suzen Dials M.D.   On: 11/11/2023  14:13   CT HEAD WO CONTRAST Result Date: 11/11/2023 EXAM: CT HEAD WITHOUT CONTRAST 11/11/2023 01:36:00 PM TECHNIQUE: CT of the head was performed without the administration of intravenous contrast. Automated exposure control, iterative reconstruction, and/or weight based adjustment of the mA/kV was utilized to reduce the radiation dose to as low as reasonably achievable. COMPARISON: CT head and neck 11/26/2021 and MR head 11/27/2021. CLINICAL HISTORY: Mental status change, unknown cause. Family reports increased confusion and concern for stroke. Symptoms started yesterday and patient had incoherent speech. FINDINGS: BRAIN AND VENTRICLES: No acute hemorrhage. No evidence of acute infarct. Ventricular enlargement is stable from prior exams. Diffuse white matter changes are again noted. This most likely reflects the sequelae of chronic microvascular ischemia. No extra-axial collection. No mass effect or midline shift. ORBITS: Right lens replacement is  noted. SINUSES: No acute abnormality. SOFT TISSUES AND SKULL: No acute soft tissue abnormality. No skull fracture. IMPRESSION: 1. No acute intracranial abnormality. 2. Stable ventricular enlargement. 3. Stable diffuse white matter changes, likely sequelae of chronic microvascular ischemia. Electronically signed by: Lonni Necessary MD 11/11/2023 01:49 PM EDT RP Workstation: [id number]    EKG: Independently reviewed.  NSR with rate 72; nonspecific ST changes with no evidence of acute ischemia. Incomplete LBBB   Labs on Admission: I have personally reviewed the available labs and imaging studies at the time of the admission.  Pertinent labs:    BUN: 30,  creatinine: 1.34,  UA: large LE, >50 WBC, many bacteria  Assessment and Plan: Principal Problem:   Acute encephalopathy Active Problems:   UTI (urinary tract infection)   LBBB (left bundle branch block)   Hypertension associated with type 2 diabetes mellitus (HCC)   Type 2 diabetes mellitus with  vascular disease (HCC)   History of CVA (cerebrovascular accident)   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Hyperlipidemia associated with type 2 diabetes mellitus (HCC)    Assessment and Plan: * Acute encephalopathy 83 year old presenting to ED with acute confusion x 2 days and speech difficulty, poor PO intake and agitation -admit to tele  -she has answered questions appropriately, but is slow and then has a hard time getting words out with intermittent confusion -CT head with no acute findings -MRI brain ordered -swallow eval  -fall precautions  -? Infectious cause as her urine looks infected, but she has significant rectovaginal fistula. Continue rocephin , urine culture pending  -metabolic labs pending including B1 in setting of poor PO intake  -gentle IVF overnight  -PT/OT  UTI (urinary tract infection) History of large rectovaginal fistula Complains of frequency/urgency but states not acute Check bladder scan. Strict I/O  Culture pending Continue rocephin    LBBB (left bundle branch block) New LBBB Troponin pending Echo ordered   Hypertension associated with type 2 diabetes mellitus (HCC) Pressures initially soft, but now hypertensive Outside of permissive HTN window if she has had a possible CVA event Watch pressures, continue norvasc  tomorrow if bp allows   Type 2 diabetes mellitus with vascular disease (HCC) A1C in August was 7.8 Hold jardiance  in setting of possible UTI Hold metformin  Sensitive SSI and accuchecks qac/hs   History of CVA (cerebrovascular accident) History of Small remote right thalamic lacunar infarct in 05/2013 Continue ASA and statin  MRI pending     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 She does not want this.  - She plans to reach out to Dr Sherma team to discuss options for symptoms of uterovaginal prolapse  independent of colostomy.   Hyperlipidemia associated with type 2 diabetes mellitus (HCC) Continue lipitor 20mg  daily     Advance Care Planning:   Code Status: Full Code   Consults: PT/OT   DVT Prophylaxis: SCDs  Family Communication: updated daughter by phone: Shawn Muslim   Severity of Illness: The appropriate patient status for this patient is OBSERVATION. Observation status is judged to be reasonable and necessary in order to provide the required intensity of service to ensure the patient's safety. The patient's presenting symptoms, physical exam findings, and initial radiographic and laboratory data in the context of their medical condition is felt to place them at decreased risk for further clinical deterioration. Furthermore, it is anticipated that the patient will be medically  stable for discharge from the hospital within 2 midnights of admission.   Author: Isaiah Geralds, MD 11/11/2023 11:06 PM  For on call review [email or web address].

## 2023-11-11 NOTE — ED Provider Notes (Addendum)
 Standard EMERGENCY DEPARTMENT AT [facility] Provider Note   CSN: [id number] Arrival date & time: 11/11/23  1217     Patient presents with: Altered Mental Status   Patricia Mccullough is a 83 y.o. female.   Patient is an 83 year old female with a history of diabetes, hypertension, hyperlipidemia who lives independently and takes care of her disabled son and is presenting today with altered mental status.  Discussed the patient's situation with her daughter who lives out of state but who talks to her daily.  She reports that when she talked to her on Tuesday she reports that she had a mild cough and was generally feeling a little bit unwell.  However when she talked to her on Wednesday she seemed to be better and reported she felt better but then when she called her yesterday she states she was not herself.  She was continually repeating herself and using the wrong words and statements which is very unlike her.  Her daughter called an ambulance but when they arrived at her home the patient refused to go with them.  She stayed at home last night and her daughter sent her sister-in-law over to check on her today and she was no better and then agreed to come to the hospital.  She was having a headache earlier in the week but denies any headache currently.  She does admit that it is hard to get her words out but then at times seems like she just has difficulty following a conversation.  She denies any abdominal pain, nausea, vomiting and she has not had a fever.  She denies shortness of breath.  It is unclear if patient has been eating or drinking anything.  Her daughter is not aware of any recent medication changes but does report that she has uterine prolapse with a fistula and a lot of issues with urination and stool habits.  The history is provided by the patient, a relative and medical records.  Altered Mental Status      Prior to Admission medications   Medication Sig Start Date  End Date Taking? Authorizing Provider  amLODipine  (NORVASC ) 5 MG tablet TAKE 1 TABLET BY MOUTH ONCE DAILY . 04/12/23   Geofm Glade PARAS, MD  aspirin  EC 325 MG tablet Take 1 tablet (325 mg total) by mouth daily. 05/08/13   Inocencio Berwyn LABOR, MD  atorvastatin  (LIPITOR) 20 MG tablet Take 1 tablet (20 mg total) by mouth daily. 04/12/23 04/11/24  Geofm Glade PARAS, MD  Continuous Glucose Sensor (FREESTYLE LIBRE 2 SENSOR) MISC USE TO CHECK BLOOD SUGAR 4 TIMES DAILY 05/11/23   Geofm Glade PARAS, MD  fluticasone  (FLONASE ) 50 MCG/ACT nasal spray Place 1 spray into both nostrils 2 (two) times daily as needed for allergies or rhinitis.    [provider]  JARDIANCE  10 MG TABS tablet Take 1 tablet (10 mg total) by mouth every morning. 04/12/23   Burns, Glade PARAS, MD  metFORMIN  (GLUCOPHAGE ) 1000 MG tablet TAKE 1 TABLET BY MOUTH TWICE DAILY WITH  A  MEAL 11/03/23   Burns, Glade PARAS, MD  SYSTANE BALANCE 0.6 % SOLN Place 1 drop into both eyes in the morning, at noon, in the evening, and at bedtime. 07/15/21   [provider]    Allergies: Patient has no known allergies.    Review of Systems  Updated Vital Signs BP 125/65   Pulse 75   Temp 98.7 F (37.1 C)   Resp 17   SpO2 100%  Physical Exam Vitals and nursing note reviewed.  Constitutional:      General: She is not in acute distress.    Appearance: She is well-developed.  HENT:     Head: Normocephalic and atraumatic.  Eyes:     Extraocular Movements: Extraocular movements intact.     Pupils: Pupils are equal, round, and reactive to light.  Cardiovascular:     Rate and Rhythm: Normal rate and regular rhythm.     Heart sounds: Normal heart sounds. No murmur heard.    No friction rub.  Pulmonary:     Effort: Pulmonary effort is normal.     Breath sounds: Normal breath sounds. No wheezing or rales.  Abdominal:     General: Bowel sounds are normal. There is no distension.     Palpations: Abdomen is soft.     Tenderness: There is no abdominal  tenderness. There is no guarding or rebound.  Musculoskeletal:        General: No tenderness. Normal range of motion.     Comments: No edema  Skin:    General: Skin is warm and dry.     Findings: No rash.  Neurological:     Mental Status: She is alert and oriented to person, place, and time.     Cranial Nerves: No cranial nerve deficit.     Comments: Patient is oriented to person place and month.  However she is not able to repeat sentences and continually ask what she needs to say again and then will answer a question that was asked of her earlier.  She has no focal weakness or pronator drift.  No facial droop.  Sensation appears intact.  She does not say much in her speech is hard to understand at times and lasts she is just saying yes or no or repeating a question you just asked her.  Psychiatric:        Behavior: Behavior normal.     (all labs ordered are listed, but only abnormal results are displayed) Labs Reviewed  COMPREHENSIVE METABOLIC PANEL WITH GFR - Abnormal; Notable for the following components:      Result Value   Glucose, Bld 262 (*)    BUN 30 (*)    Creatinine, Ser 1.34 (*)    Alkaline Phosphatase 130 (*)    GFR, Estimated 39 (*)    All other components within normal limits  URINALYSIS, W/ REFLEX TO CULTURE (INFECTION SUSPECTED) - Abnormal; Notable for the following components:   APPearance HAZY (*)    Glucose, UA >=500 (*)    Leukocytes,Ua LARGE (*)    Bacteria, UA MANY (*)    All other components within normal limits  RESP PANEL BY RT-PCR (RSV, FLU A&B, COVID)  RVPGX2  URINE CULTURE  ETHANOL  PROTIME-INR  APTT  CBC  DIFFERENTIAL  URINE DRUG SCREEN  AMMONIA    EKG: None  Radiology: [facility] Chest Port 1 View Result Date: 11/11/2023 CLINICAL DATA:  Altered mental status. EXAM: PORTABLE CHEST 1 VIEW COMPARISON:  November 27, 2021 FINDINGS: The heart size and mediastinal contours are within normal limits. Both lungs are clear. The visualized skeletal structures  are unremarkable. IMPRESSION: No active disease. Electronically Signed   By: Suzen Dials M.D.   On: 11/11/2023 14:13   CT HEAD WO CONTRAST Result Date: 11/11/2023 EXAM: CT HEAD WITHOUT CONTRAST 11/11/2023 01:36:00 PM TECHNIQUE: CT of the head was performed without the administration of intravenous contrast. Automated exposure control, iterative reconstruction, and/or weight based adjustment of  the mA/kV was utilized to reduce the radiation dose to as low as reasonably achievable. COMPARISON: CT head and neck 11/26/2021 and MR head 11/27/2021. CLINICAL HISTORY: Mental status change, unknown cause. Family reports increased confusion and concern for stroke. Symptoms started yesterday and patient had incoherent speech. FINDINGS: BRAIN AND VENTRICLES: No acute hemorrhage. No evidence of acute infarct. Ventricular enlargement is stable from prior exams. Diffuse white matter changes are again noted. This most likely reflects the sequelae of chronic microvascular ischemia. No extra-axial collection. No mass effect or midline shift. ORBITS: Right lens replacement is noted. SINUSES: No acute abnormality. SOFT TISSUES AND SKULL: No acute soft tissue abnormality. No skull fracture. IMPRESSION: 1. No acute intracranial abnormality. 2. Stable ventricular enlargement. 3. Stable diffuse white matter changes, likely sequelae of chronic microvascular ischemia. Electronically signed by: Lonni Necessary MD 11/11/2023 01:49 PM EDT RP Workstation: [id number]     Procedures   Medications Ordered in the ED  lactated ringers  bolus 1,000 mL (1,000 mLs Intravenous New Bag/Given 11/11/23 1301)                                    Medical Decision Making Amount and/or Complexity of Data Reviewed Independent Historian:     Details: daughter External Data Reviewed: notes. Labs: ordered. Decision-making details documented in ED Course. Radiology: ordered and independent interpretation performed. Decision-making details  documented in ED Course.   Pt with multiple medical problems and comorbidities and presenting today with a complaint that caries a high risk for morbidity and mortality.  Here today with altered mental status.  Concern for stroke versus intracranial bleed versus encephalopathy from infectious or metabolic causes.  Patient has a normal blood sugar.  However blood pressure is low and she will be given IV fluids.  Labs and imaging are pending.  3:59 PM I independently interpreted patient's labs and CBC without acute findings, UDS is negative, EtOH is normal, viral swab is negative, CMP with new AKI with creatinine of 1.34 from her baseline of 0.8.  I have independently visualized and interpreted pt's images today.  Head CT without evidence of bleed or masses, chest x-ray within normal limits.  Radiology does report no acute intracranial abnormality but stable ventricular enlargement and stable diffuse white matter changes likely of chronic microvascular ischemia.  On repeat evaluation patient is still not able to say her words and still concern for stroke.  UA still pending but MRI will also ordered.  Based on patient's exam do not feel that she will be stable for discharge and will most likely need admission.  Vital signs are now stable and blood pressure has been normal after a fluid bolus. UA did return and patient has large leukocytes with many white cells and bacteria culture was sent.  Patient was started on Rocephin .      Final diagnoses:  None    ED Discharge Orders     None          Doretha Folks, MD 11/11/23 1526    Doretha Folks, MD 11/11/23 1600

## 2023-11-11 NOTE — Assessment & Plan Note (Addendum)
 83 year old presenting to ED with acute confusion x 2 days and speech difficulty, poor PO intake and agitation -admit to tele  -she has answered questions appropriately, but is slow and then has a hard time getting words out with intermittent confusion -CT head with no acute findings -MRI brain ordered -swallow eval  -fall precautions  -? Infectious cause as her urine looks infected, but she has significant rectovaginal fistula. Continue rocephin , urine culture pending  -metabolic labs pending including B1 in setting of poor PO intake  -gentle IVF overnight  -PT/OT

## 2023-11-11 NOTE — ED Provider Notes (Signed)
 Patient signed out to me by Dr. Doretha.  Patient was scheduled to have an MRI.  Unfortunately that cannot be done today.  Patient treated for UTI already.  Will need to be admitted for evaluation of her expressive aphasia.  Will consult hospitalist team   Dasie Faden, MD 11/11/23 [phone]

## 2023-11-11 NOTE — Assessment & Plan Note (Signed)
 History of Small remote right thalamic lacunar infarct in 05/2013 Continue ASA and statin  MRI pending

## 2023-11-11 NOTE — Assessment & Plan Note (Signed)
 New LBBB Troponin pending Echo ordered

## 2023-11-11 NOTE — ED Notes (Signed)
 Attempting to obtain urine specimen patient is on bedpan.  Two family members are in room at present time.

## 2023-11-11 NOTE — Assessment & Plan Note (Addendum)
 Pressures initially soft, but now hypertensive Outside of permissive HTN window if she has had a possible CVA event Watch pressures, continue norvasc  tomorrow if bp allows

## 2023-11-12 ENCOUNTER — Observation Stay (HOSPITAL_COMMUNITY)

## 2023-11-12 ENCOUNTER — Observation Stay (HOSPITAL_BASED_OUTPATIENT_CLINIC_OR_DEPARTMENT_OTHER)

## 2023-11-12 DIAGNOSIS — G9340 Encephalopathy, unspecified: Secondary | ICD-10-CM

## 2023-11-12 DIAGNOSIS — N823 Fistula of vagina to large intestine: Secondary | ICD-10-CM | POA: Diagnosis present

## 2023-11-12 DIAGNOSIS — E1122 Type 2 diabetes mellitus with diabetic chronic kidney disease: Secondary | ICD-10-CM | POA: Diagnosis present

## 2023-11-12 DIAGNOSIS — R3915 Urgency of urination: Secondary | ICD-10-CM | POA: Diagnosis present

## 2023-11-12 DIAGNOSIS — E113299 Type 2 diabetes mellitus with mild nonproliferative diabetic retinopathy without macular edema, unspecified eye: Secondary | ICD-10-CM | POA: Diagnosis present

## 2023-11-12 DIAGNOSIS — I6201 Nontraumatic acute subdural hemorrhage: Secondary | ICD-10-CM | POA: Diagnosis not present

## 2023-11-12 DIAGNOSIS — W010XXA Fall on same level from slipping, tripping and stumbling without subsequent striking against object, initial encounter: Secondary | ICD-10-CM | POA: Diagnosis present

## 2023-11-12 DIAGNOSIS — R29703 NIHSS score 3: Secondary | ICD-10-CM | POA: Diagnosis present

## 2023-11-12 DIAGNOSIS — E785 Hyperlipidemia, unspecified: Secondary | ICD-10-CM | POA: Diagnosis not present

## 2023-11-12 DIAGNOSIS — I6340 Cerebral infarction due to embolism of unspecified cerebral artery: Principal | ICD-10-CM

## 2023-11-12 DIAGNOSIS — I447 Left bundle-branch block, unspecified: Secondary | ICD-10-CM | POA: Diagnosis not present

## 2023-11-12 DIAGNOSIS — Z79899 Other long term (current) drug therapy: Secondary | ICD-10-CM | POA: Diagnosis not present

## 2023-11-12 DIAGNOSIS — L500 Allergic urticaria: Secondary | ICD-10-CM | POA: Diagnosis not present

## 2023-11-12 DIAGNOSIS — R2689 Other abnormalities of gait and mobility: Secondary | ICD-10-CM | POA: Diagnosis not present

## 2023-11-12 DIAGNOSIS — R29701 NIHSS score 1: Secondary | ICD-10-CM | POA: Diagnosis not present

## 2023-11-12 DIAGNOSIS — Z8673 Personal history of transient ischemic attack (TIA), and cerebral infarction without residual deficits: Secondary | ICD-10-CM | POA: Diagnosis not present

## 2023-11-12 DIAGNOSIS — I639 Cerebral infarction, unspecified: Secondary | ICD-10-CM | POA: Diagnosis present

## 2023-11-12 DIAGNOSIS — E1159 Type 2 diabetes mellitus with other circulatory complications: Secondary | ICD-10-CM | POA: Diagnosis not present

## 2023-11-12 DIAGNOSIS — I152 Hypertension secondary to endocrine disorders: Secondary | ICD-10-CM | POA: Diagnosis not present

## 2023-11-12 DIAGNOSIS — S0003XA Contusion of scalp, initial encounter: Secondary | ICD-10-CM | POA: Diagnosis not present

## 2023-11-12 DIAGNOSIS — G9349 Other encephalopathy: Secondary | ICD-10-CM | POA: Diagnosis present

## 2023-11-12 DIAGNOSIS — G319 Degenerative disease of nervous system, unspecified: Secondary | ICD-10-CM | POA: Diagnosis not present

## 2023-11-12 DIAGNOSIS — Z7982 Long term (current) use of aspirin: Secondary | ICD-10-CM | POA: Diagnosis not present

## 2023-11-12 DIAGNOSIS — Z823 Family history of stroke: Secondary | ICD-10-CM | POA: Diagnosis not present

## 2023-11-12 DIAGNOSIS — N390 Urinary tract infection, site not specified: Secondary | ICD-10-CM | POA: Diagnosis not present

## 2023-11-12 DIAGNOSIS — Y92009 Unspecified place in unspecified non-institutional (private) residence as the place of occurrence of the external cause: Secondary | ICD-10-CM | POA: Diagnosis not present

## 2023-11-12 DIAGNOSIS — N1831 Chronic kidney disease, stage 3a: Secondary | ICD-10-CM | POA: Diagnosis present

## 2023-11-12 DIAGNOSIS — Z7984 Long term (current) use of oral hypoglycemic drugs: Secondary | ICD-10-CM | POA: Diagnosis not present

## 2023-11-12 DIAGNOSIS — I6522 Occlusion and stenosis of left carotid artery: Secondary | ICD-10-CM | POA: Diagnosis not present

## 2023-11-12 DIAGNOSIS — R638 Other symptoms and signs concerning food and fluid intake: Secondary | ICD-10-CM | POA: Diagnosis not present

## 2023-11-12 DIAGNOSIS — Z8041 Family history of malignant neoplasm of ovary: Secondary | ICD-10-CM | POA: Diagnosis not present

## 2023-11-12 DIAGNOSIS — Z803 Family history of malignant neoplasm of breast: Secondary | ICD-10-CM | POA: Diagnosis not present

## 2023-11-12 DIAGNOSIS — I672 Cerebral atherosclerosis: Secondary | ICD-10-CM | POA: Diagnosis not present

## 2023-11-12 DIAGNOSIS — N179 Acute kidney failure, unspecified: Secondary | ICD-10-CM | POA: Diagnosis present

## 2023-11-12 DIAGNOSIS — R220 Localized swelling, mass and lump, head: Secondary | ICD-10-CM | POA: Diagnosis not present

## 2023-11-12 DIAGNOSIS — R4701 Aphasia: Secondary | ICD-10-CM | POA: Diagnosis not present

## 2023-11-12 DIAGNOSIS — E1169 Type 2 diabetes mellitus with other specified complication: Secondary | ICD-10-CM | POA: Diagnosis not present

## 2023-11-12 DIAGNOSIS — I10 Essential (primary) hypertension: Secondary | ICD-10-CM | POA: Diagnosis not present

## 2023-11-12 DIAGNOSIS — G9389 Other specified disorders of brain: Secondary | ICD-10-CM | POA: Diagnosis not present

## 2023-11-12 DIAGNOSIS — M6281 Muscle weakness (generalized): Secondary | ICD-10-CM | POA: Diagnosis not present

## 2023-11-12 DIAGNOSIS — S065X0A Traumatic subdural hemorrhage without loss of consciousness, initial encounter: Secondary | ICD-10-CM | POA: Diagnosis not present

## 2023-11-12 LAB — AMMONIA: Ammonia: 47 umol/L — ABNORMAL HIGH (ref 9–35)

## 2023-11-12 LAB — GLUCOSE, CAPILLARY
Glucose-Capillary: 181 mg/dL — ABNORMAL HIGH (ref 70–99)
Glucose-Capillary: 200 mg/dL — ABNORMAL HIGH (ref 70–99)
Glucose-Capillary: 172 mg/dL — ABNORMAL HIGH (ref 70–99)
Glucose-Capillary: 192 mg/dL — ABNORMAL HIGH (ref 70–99)

## 2023-11-12 LAB — CBC
HCT: 36.8 % (ref 36.0–46.0)
Hemoglobin: 11.5 g/dL — ABNORMAL LOW (ref 12.0–15.0)
MCH: 30.1 pg (ref 26.0–34.0)
MCHC: 31.3 g/dL (ref 30.0–36.0)
MCV: 96.3 fL (ref 80.0–100.0)
Platelets: 241 K/uL (ref 150–400)
RBC: 3.82 MIL/uL — ABNORMAL LOW (ref 3.87–5.11)
RDW: 14 % (ref 11.5–15.5)
WBC: 8.2 K/uL (ref 4.0–10.5)
nRBC: 0 % (ref 0.0–0.2)

## 2023-11-12 LAB — BASIC METABOLIC PANEL WITH GFR
Anion gap: 11 (ref 5–15)
BUN: 28 mg/dL — ABNORMAL HIGH (ref 8–23)
CO2: 24 mmol/L (ref 22–32)
Calcium: 9.1 mg/dL (ref 8.9–10.3)
Chloride: 105 mmol/L (ref 98–111)
Creatinine, Ser: 0.96 mg/dL (ref 0.44–1.00)
GFR, Estimated: 59 mL/min — ABNORMAL LOW (ref >60)
Glucose, Bld: 205 mg/dL — ABNORMAL HIGH (ref 70–99)
Potassium: 4.1 mmol/L (ref 3.5–5.1)
Sodium: 140 mmol/L (ref 135–145)

## 2023-11-12 LAB — ECHOCARDIOGRAM COMPLETE
Area-P 1/2: 2.58 cm2
Height: 61 in
S' Lateral: 1.8 cm
Weight: 2042.34 [oz_av]

## 2023-11-12 LAB — TROPONIN T, HIGH SENSITIVITY
Troponin T High Sensitivity: <15 ng/L (ref 0–19)
Troponin T High Sensitivity: <15 ng/L (ref 0–19)

## 2023-11-12 MED ORDER — ENSURE PLUS HIGH PROTEIN PO LIQD
237.0000 mL | Freq: Two times a day (BID) | ORAL | Status: DC
Start: 2023-11-12 — End: 2023-11-15
  Administered 2023-11-12 – 2023-11-15 (×4): 237 mL via ORAL

## 2023-11-12 NOTE — Plan of Care (Signed)

## 2023-11-12 NOTE — Progress Notes (Signed)
 Mri reviewed which shows very tiny B SDH. This is insignificant at this time and is ok to start aspirin . These were not visualized on head CT yesterday. No further interventions necessary

## 2023-11-12 NOTE — Consult Note (Signed)
 NEUROLOGY CONSULT NOTE   Date of service: November 12, 2023 Patient Name: Patricia Mccullough MRN:  [id number] DOB:  1940/07/03 Chief Complaint: Confusion Requesting Provider: Barbarann Nest, MD  History of Present Illness  Patricia Mccullough is a 83 y.o. female with hx of diabetes, hypertension, hyperlipidemia who started noticing confusion on Tuesday.  Because of continued confusion, she sought care in the emergency department where MRI was performed showing small bilateral subdural hematomas as well as a cortical infarct on the left.  Neurology has been consulted for further management  LKW: Tuesday IV Thrombolysis: No, outside of window EVT: No, outside of window  NIHSS components Score: Comment  1a Level of Conscious 0[]  1[]  2[]  3[]      1b LOC Questions 0[]  1[]  2[]       1c LOC Commands 0[]  1[]  2[]       2 Best Gaze 0[]  1[]  2[]       3 Visual 0[]  1[]  2[]  3[]      4 Facial Palsy 0[]  1[]  2[]  3[]      5a Motor Arm - left 0[]  1[]  2[]  3[]  4[]  UN[]    5b Motor Arm - Right 0[]  1[]  2[]  3[]  4[]  UN[]    6a Motor Leg - Left 0[]  1[]  2[]  3[]  4[]  UN[]    6b Motor Leg - Right 0[]  1[]  2[]  3[]  4[]  UN[]    7 Limb Ataxia 0[]  1[]  2[]  UN[]      8 Sensory 0[]  1[]  2[]  UN[]      9 Best Language 0[]  1[]  2[]  3[]      10 Dysarthria 0[]  1[]  2[]  UN[]      11 Extinct. 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 She has never used smokeless tobacco. She reports that she does not drink alcohol and does not use drugs.  Allergies  Allergen Reactions   Shellfish-Derived Products Other (See Comments)    Spiritual belief No Shellfish     Medications   Current Facility-Administered Medications:    acetaminophen  (TYLENOL ) tablet 650 mg, 650 mg, Oral, Q6H PRN **OR** acetaminophen  (TYLENOL ) suppository 650 mg, 650 mg, Rectal, Q6H PRN, Waddell Rake, MD   aspirin  EC tablet 325 mg, 325 mg, Oral, Daily, Waddell Rake, MD, 325 mg at 11/12/23 9090   atorvastatin  (LIPITOR) tablet 20 mg, 20 mg, Oral, Daily, Waddell Rake, MD, 20 mg at 11/12/23 0909   feeding supplement (ENSURE PLUS HIGH PROTEIN) liquid 237 mL, 237 mL, Oral, BID BM, Waddell Rake, MD, 237 mL at 11/12/23 [phone]  insulin  aspart (novoLOG ) injection 0-9 Units, 0-9 Units, Subcutaneous, TID WC, Waddell Rake, MD, 2 Units at 11/12/23 1727   ondansetron  (ZOFRAN ) tablet 4 mg, 4 mg, Oral, Q6H PRN **OR** ondansetron  (ZOFRAN ) injection 4 mg, 4 mg, Intravenous, Q6H PRN, Waddell Rake, MD  Vitals   Vitals:   11/12/23 0310 11/12/23 0700 11/12/23 1208 11/12/23 2014  BP: (!) 151/86 118/64 139/67 (!) 168/69  Pulse: 72  67 71  Resp:   16 18  Temp: 97.8 F (36.6 C) 98 F (36.7 C) 98.3 F (36.8 C) 98.9 F (37.2 C)  TempSrc: Oral Oral Oral Oral  SpO2: 100% 97% 99% 96%  Weight:      Height:        Body mass index is 24.12 kg/m.   Physical Exam   Constitutional: Appears well-developed and well-nourished.   Neurologic Examination    Neuro: Mental Status: Patient is awake, alert, oriented to person, place, month, year, and situation. Patient is able to give a clear and coherent history. No signs  of aphasia, but she does repeatedly extinguish to double simultaneous stimulation in the legs, but not in the arms or face Cranial Nerves: II: Visual Fields are full. Pupils are equal, round, and reactive to light.   III,IV, VI: EOMI without ptosis or diploplia.  V: Facial sensation is symmetric to temperature VII: Facial movement is symmetric.  VIII: hearing is intact to voice X: Uvula elevates symmetrically XII: tongue is midline without atrophy or fasciculations.  Motor: Tone is normal. Bulk is normal. 5/5 strength was present in all four extremities.  Sensory: Sensation is symmetric to light touch and temperature in the arms and legs.  She does extinguish to DSS in the legs Cerebellar: No clear ataxia on finger-nose-finger   Labs/Imaging/Neurodiagnostic studies   CBC:  Recent Labs  Lab 12-02-23 1253 11/12/23 0135  WBC 8.5 8.2  NEUTROABS 5.2  --   HGB 12.3 11.5*  HCT 39.1 36.8  MCV 96.3 96.3  PLT 236 241   Basic Metabolic Panel:  Lab Results  Component Value Date   NA 140 11/12/2023   K 4.1 11/12/2023   CO2 24 11/12/2023   GLUCOSE 205 (H) 11/12/2023   BUN 28 (H) 11/12/2023   CREATININE 0.96 11/12/2023   CALCIUM  9.1 11/12/2023   GFRNONAA 59 (L) 11/12/2023   GFRAA  01/09/2009    >60        The eGFR has been calculated using the MDRD equation. This calculation has not been validated in all clinical situations. eGFR's persistently <60 mL/min signify possible Chronic Kidney Disease.   Lipid Panel:  Lab Results  Component Value Date   LDLCALC 24 10/11/2023   HgbA1c:  Lab Results  Component Value Date   HGBA1C 7.8 (H) 10/11/2023   Urine Drug Screen:     Component Value Date/Time   LABOPIA NEGATIVE Dec 02, 2023 1434   COCAINSCRNUR NEGATIVE 12/02/2023 1434   LABBENZ NEGATIVE Dec 02, 2023 1434   AMPHETMU NEGATIVE 2023-12-02 1434   THCU NEGATIVE 2023/12/02 1434   LABBARB NEGATIVE 12-02-23 1434    Alcohol Level     Component Value Date/Time   ETH <15  2023-12-02 1253   INR  Lab Results  Component Value Date   INR 1.1 12/02/23   APTT  Lab Results  Component Value Date   APTT 28 12/02/2023   CT Head without contrast(Personally reviewed): Small SDH   MRI Brain(Personally reviewed): Small embolic stroke  ASSESSMENT   Patricia Mccullough is a 84 y.o. 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  I would not typically think this stroke would cause confusion, but it does seem to be causing some mild neglect therefore I think it could be contributing to the confusion as well.  Certainly, secondary prevention measures need to be undertaken.  A lipid panel was just recently performed with an LDL of 24, I do not think this needs to be repeated A1c was 7.8, I would favor using a target of seven.  Though I would typically do dual antiplatelet therapy, the relative benefit that she get from this is small and I would not favor doing it in the setting of an acute subdural hematoma.  Antiplatelet therapy was discussed with neurosurgery, and the baby aspirin  was felt to be reasonable.  RECOMMENDATIONS  ASA 81 mg daily CTA head and neck Echo, telemetry PT, OT, ST If no clear embolic source is found, I would consider prolonged cardiac monitoring as an outpatient ______________________________________________________________________   Signed, Aisha Seals, MD [facility]

## 2023-11-12 NOTE — Progress Notes (Signed)
 Progress Note   Patient: Patricia Mccullough FMW:[id number] DOB: 1940/09/25 DOA: 11/11/2023     0 DOS: the patient was seen and examined on 11/12/2023   Brief hospital course: 83yo with h/o T2DM, HTN, HLD, TIA, and rectovaginal fistula who presented on 9/26 with AMS and aphasia.  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  Echo ordered.  Neurology consulted.  Also with tiny B SDH, neurosurgery is ok with starting ASA.  Assessment and Plan:  Acute CVA Patient presented with acute confusion x 2 days and speech difficulty, poor PO intake and agitation Admitted to telemetry  She has answered questions appropriately, but is slow and then has a hard time getting words out with intermittent confusion CT head with no acute findings but edited result with 3 mm SDH (seen on MRI) MRI brain with acute cortical and subadjacent white matter infarct in anterior R parietal lobe; also with small B SDH Echo with preserved EF, grade 1 DD Patient will need DAPT for 21 days when ABCD2 score is at least 4 and NIH score is 3 or less, and then can transition to monotherapy with a single antiplatelet agent.  Given SDH, will defer to neurology for now. Neurosurgery has approved ASA Neurology consulted PT/OT/ST (cognitive/language) Consults She lives alone and family is concerned that she may need STR  HTN Allow permissive HTN for now Treat BP only if >220/120, and then with goal of 15% reduction Hold amlodipine  and plan to restart in 48-72 hours   HLD 8/26 lipid panel: 119/77/24/91 LDL is at goal Continue atorvastatin  at current dose   DM 8/26 A1c 7.8, reasonable but not ideal control Hold metformin  Will order sensitive-scale SSI   Stage 3a CKD Appears to be stable at this time Attempt to avoid nephrotoxic medications Hold Jardiance  Recheck BMP in AM    Abnormal UA History of large rectovaginal fistula Complains of frequency/urgency but states  not acute Culture pending Likely colonization in the setting of fistula   LBBB (left bundle branch block) New LBBB Troponin negative x 2 Echo with mild LVH of basal-septal segment   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   Objective: Vitals:   11/12/23 0700 11/12/23 1208  BP: 118/64 139/67  Pulse:  67  Resp:  16  Temp: 98 F (36.7 C) 98.3 F (36.8 C)  SpO2: 97% 99%    Intake/Output Summary (Last 24 hours) at 11/12/2023 1435 Last data filed at 11/12/2023 0803 Gross per 24 hour  Intake 1876.88 ml  Output 1050 ml  Net 826.88 ml   Filed Weights   11/11/23 2310  Weight: 57.9 kg    Exam:  General:  Appears calm and comfortable and is in NAD Eyes:  normal lids, iris ENT:  grossly normal hearing, lips & tongue, mmm Cardiovascular:  RRR. No LE edema.  Respiratory:   CTA bilaterally with no wheezes/rales/rhonchi.  Normal respiratory effort. Abdomen:  soft, NT, ND Skin:  no rash or induration seen on limited  exam Musculoskeletal:  grossly normal tone BUE/BLE, good ROM, no bony abnormality Psychiatric:  grossly normal mood and affect, speech mildly aphasic Neurologic:  CN 2-12 grossly intact, moves all extremities in coordinated fashion  Data Reviewed: I have reviewed the patient's lab results since admission.  Pertinent labs for today include:   Glucose 205 BUN 28/Creatinine 0.98/GFR 59 NH4 47 HS troponin <15 x 2 WBC 8.2 Hgb 11.5 UA: >500 glucose, large LE, many bacteria Urine  culture pending Blood cultures pending    Family Communication: None present; I spoke with her daughter by telephone  Mobility: PT/OT Consulted     Code Status: Full Code    Disposition: Status is: Inpatient Admit - It is my clinical opinion that admission to INPATIENT is reasonable and necessary because of the expectation that this patient will require hospital care that crosses at least 2 midnights to treat this condition based on the medical complexity of the problems presented.  Given the aforementioned information, the predictability of an adverse outcome is felt to be significant.      Time spent: 50 minutes  Unresulted Labs (From admission, onward)     Start     Ordered   11/13/23 0500  CBC with Differential/Platelet  Tomorrow morning,   R        11/12/23 1435   11/13/23 0500  Basic metabolic panel with GFR  Tomorrow morning,   R        11/12/23 1435   11/11/23 2037  Culture, blood (Routine X 2) w Reflex to ID Panel  BLOOD CULTURE X 2,   R      11/11/23 2036   11/11/23 2029  Vitamin B1  Once,   R        11/11/23 2029   11/11/23 1526  Urine Culture  Once,   R        11/11/23 1526             Author: Delon Herald, MD 11/12/2023 2:35 PM  For on call review [email or web address].

## 2023-11-12 NOTE — Hospital Course (Addendum)
 83yo with h/o T2DM, HTN, HLD, TIA, and rectovaginal fistula who presented on 9/26 with AMS and aphasia.  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  Echo ordered.  Neurology consulted.  Also with tiny B SDH, neurosurgery is ok with starting ASA.

## 2023-11-12 NOTE — Evaluation (Signed)
 Physical Therapy Evaluation Patient Details Name: Patricia Mccullough MRN: [id number] DOB: 1940-04-15 Today's Date: 11/12/2023  History of Present Illness  83yo with who presented on 9/26 with AMS and aphasia. 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 Neurology consulted. Also with tiny B SDH, neurosurgery is ok with starting ASA. h/o T2DM, HTN, HLD, TIA, and rectovaginal fistula who presented on 9/26 with AMS and aphasia.  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 Neurology consulted.  Also with tiny B SDH, neurosurgery is ok with starting ASA.  Clinical Impression  Pt admitted with above diagnosis.  Pt A&Ox3 at time of PT eval, processing delayed at times however appeared to improve during session. Pt amb hallway distance with min assist, HHA utilized as pt wanted to attempt without RW. Per MD pt's family is interested post acute rehab. Pt is ind at her baseline, cares for a disabled son that lives with her.  Patient will benefit from continued inpatient follow up therapy, <3 hours/day   Pt currently with functional limitations due to the deficits listed below (see PT Problem List). Pt will benefit from acute skilled PT to increase their independence and safety with mobility to allow discharge.           If plan is discharge home, recommend the following: A little help with walking and/or transfers;A little help with bathing/dressing/bathroom;Help with stairs or ramp for entrance;Assist for transportation   Can travel by private vehicle   Yes    Equipment Recommendations Other (comment) (TBD)  Recommendations for Other Services       Functional Status Assessment Patient has had a recent decline in their functional status and demonstrates the ability to make significant improvements in function in a reasonable and predictable amount of time.     Precautions / Restrictions  Precautions Precautions: Fall Precaution/Restrictions Comments: permissive HTN for now--11/12/23 Restrictions Weight Bearing Restrictions Per Provider Order: No      Mobility  Bed Mobility Overal bed mobility: Needs Assistance Bed Mobility: Supine to Sit, Sit to Supine     Supine to sit: Min assist Sit to supine: Min assist   General bed mobility comments: assist to elevate trunk, incr time, assist to elevate LEs fully on to bed    Transfers Overall transfer level: Needs assistance Equipment used: Rolling walker (2 wheels) Transfers: Sit to/from Stand Sit to Stand: Min assist           General transfer comment: assist to rise and steady in standing, wide compensatory BOS    Ambulation/Gait Ambulation/Gait assistance: Min assist Gait Distance (Feet): 90 Feet (+60') Assistive device: 2 person hand held assist, 1 person hand held assist Gait Pattern/deviations: Decreased step length - right, Decreased step length - left, Scissoring, Drifts right/left       General Gait Details: assist to steady, slight lateral drift and intermittent slight scissoring noted  Stairs            Wheelchair Mobility     Tilt Bed    Modified Rankin (Stroke Patients Only) Modified Rankin (Stroke Patients Only) Pre-Morbid Rankin Score: No symptoms Modified Rankin: Moderately severe disability     Balance Overall balance assessment: Needs assistance Sitting-balance support: No upper extremity supported, Feet supported Sitting balance-Leahy Scale: Fair     Standing balance support: Single extremity supported Standing balance-Leahy Scale: Poor Standing balance comment: reliant on unilateral UE support to maintain static standing  Pertinent Vitals/Pain Pain Assessment Pain Assessment: No/denies pain    Home Living Family/patient expects to be discharged to:: Private residence Living Arrangements: Children (lives with disabled  son)   Type of Home: House Home Access: Stairs to enter   Secretary/administrator of Steps: 3   Home Layout: One level Home Equipment: Cane - single point      Prior Function Prior Level of Function : Independent/Modified Independent             Mobility Comments: does not use device ADLs Comments: does all ADLs and iADLs     Extremity/Trunk Assessment   Upper Extremity Assessment Upper Extremity Assessment: Defer to OT evaluation (no focal deficits noted)    Lower Extremity Assessment Lower Extremity Assessment: RLE deficits/detail;LLE deficits/detail RLE Deficits / Details: AROM WFL, 4+/5 throughout, denies N/T RLE Sensation: decreased proprioception LLE Deficits / Details: AROM WFL, 4+/5 throughout, denies N/T LLE Sensation: decreased proprioception LLE Coordination: decreased fine motor       Communication   Communication Communication: No apparent difficulties    Cognition Arousal: Alert Behavior During Therapy: WFL for tasks assessed/performed   PT - Cognitive impairments: No apparent impairments                         Following commands: Intact       Cueing Cueing Techniques: Verbal cues     General Comments      Exercises     Assessment/Plan    PT Assessment Patient needs continued PT services  PT Problem List Decreased activity tolerance;Decreased mobility;Decreased balance;Decreased knowledge of precautions;Decreased knowledge of use of DME;Decreased coordination       PT Treatment Interventions DME instruction;Therapeutic activities;Gait training;Functional mobility training;Therapeutic exercise;Patient/family education    PT Goals (Current goals can be found in the Care Plan section)  Acute Rehab PT Goals Patient Stated Goal: back to ind PT Goal Formulation: With patient Time For Goal Achievement: 11/25/23 Potential to Achieve Goals: Good    Frequency Min 3X/week     Co-evaluation               AM-PAC PT  6 Clicks Mobility  Outcome Measure Help needed turning from your back to your side while in a flat bed without using bedrails?: A Little Help needed moving from lying on your back to sitting on the side of a flat bed without using bedrails?: A Little Help needed moving to and from a bed to a chair (including a wheelchair)?: A Little Help needed standing up from a chair using your arms (e.g., wheelchair or bedside chair)?: A Little Help needed to walk in hospital room?: A Little Help needed climbing 3-5 steps with a railing? : A Lot 6 Click Score: 17    End of Session Equipment Utilized During Treatment: Gait belt Activity Tolerance: Patient tolerated treatment well Patient left: with call bell/phone within reach;in bed;with bed alarm set   PT Visit Diagnosis: Other abnormalities of gait and mobility (R26.89)    Time: 8464-8455 PT Time Calculation (min) (ACUTE ONLY): 9 min   Charges:   PT Evaluation $PT Eval Low Complexity: 1 Low   PT General Charges $$ ACUTE PT VISIT: 1 Visit         Amias Hutchinson, PT  Acute Rehab Dept ([facility]) [phone]  11/12/2023   [facility] 11/12/2023, 5:03 PM

## 2023-11-13 ENCOUNTER — Other Ambulatory Visit: Payer: Self-pay | Admitting: Physician Assistant

## 2023-11-13 ENCOUNTER — Inpatient Hospital Stay (HOSPITAL_COMMUNITY)

## 2023-11-13 DIAGNOSIS — I639 Cerebral infarction, unspecified: Secondary | ICD-10-CM

## 2023-11-13 DIAGNOSIS — G9340 Encephalopathy, unspecified: Secondary | ICD-10-CM | POA: Diagnosis not present

## 2023-11-13 LAB — CBC WITH DIFFERENTIAL/PLATELET
Abs Immature Granulocytes: 0.04 K/uL (ref 0.00–0.07)
Basophils Absolute: 0.1 K/uL (ref 0.0–0.1)
Basophils Relative: 1 %
Eosinophils Absolute: 0.5 K/uL (ref 0.0–0.5)
Eosinophils Relative: 6 %
HCT: 37.5 % (ref 36.0–46.0)
Hemoglobin: 11.5 g/dL — ABNORMAL LOW (ref 12.0–15.0)
Immature Granulocytes: 1 %
Lymphocytes Relative: 38 %
Lymphs Abs: 2.9 K/uL (ref 0.7–4.0)
MCH: 29.8 pg (ref 26.0–34.0)
MCHC: 30.7 g/dL (ref 30.0–36.0)
MCV: 97.2 fL (ref 80.0–100.0)
Monocytes Absolute: 0.9 K/uL (ref 0.1–1.0)
Monocytes Relative: 11 %
Neutro Abs: 3.4 K/uL (ref 1.7–7.7)
Neutrophils Relative %: 43 %
Platelets: 277 K/uL (ref 150–400)
RBC: 3.86 MIL/uL — ABNORMAL LOW (ref 3.87–5.11)
RDW: 14 % (ref 11.5–15.5)
WBC: 7.7 K/uL (ref 4.0–10.5)
nRBC: 0 % (ref 0.0–0.2)

## 2023-11-13 LAB — BASIC METABOLIC PANEL WITH GFR
Anion gap: 11 (ref 5–15)
BUN: 17 mg/dL (ref 8–23)
CO2: 21 mmol/L — ABNORMAL LOW (ref 22–32)
Calcium: 9.3 mg/dL (ref 8.9–10.3)
Chloride: 106 mmol/L (ref 98–111)
Creatinine, Ser: 0.81 mg/dL (ref 0.44–1.00)
GFR, Estimated: >60 mL/min (ref >60)
Glucose, Bld: 163 mg/dL — ABNORMAL HIGH (ref 70–99)
Potassium: 3.8 mmol/L (ref 3.5–5.1)
Sodium: 139 mmol/L (ref 135–145)

## 2023-11-13 LAB — GLUCOSE, CAPILLARY
Glucose-Capillary: 154 mg/dL — ABNORMAL HIGH (ref 70–99)
Glucose-Capillary: 197 mg/dL — ABNORMAL HIGH (ref 70–99)
Glucose-Capillary: 176 mg/dL — ABNORMAL HIGH (ref 70–99)
Glucose-Capillary: 215 mg/dL — ABNORMAL HIGH (ref 70–99)

## 2023-11-13 MED ORDER — IOHEXOL 350 MG/ML SOLN
75.0000 mL | Freq: Once | INTRAVENOUS | Status: AC | PRN
  Administered 2023-11-13: 75 mL via INTRAVENOUS

## 2023-11-13 NOTE — NC FL2 (Signed)
 [place]  MEDICAID FL2 LEVEL OF CARE FORM     IDENTIFICATION  Patient Name: Patricia Mccullough Birthdate: 03-18-1940 Sex: female Admission Date (Current Location): 11/11/2023  [facility] and [place] Number:  Producer, television/film/video and Address:  [facility],  [address], [place] [address]      Provider Number: [id number]  Attending Physician Name and Address:  Barbarann Nest, MD  Relative Name and Phone Number:  Elouise Muslim (daughter) [phone]    Current Level of Care: Hospital Recommended Level of Care: Skilled Nursing Facility Prior Approval Number:    Date Approved/Denied:   PASRR Number: [id number]  Discharge Plan: SNF    Current Diagnoses: Patient Active Problem List   Diagnosis Date Noted   Acute CVA (cerebrovascular accident) (HCC) 11/12/2023   Acute encephalopathy 11/11/2023   UTI (urinary tract infection) 11/11/2023   LBBB (left bundle branch block) 11/11/2023   Grieving 10/11/2023   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   Type 2 diabetes mellitus with vascular disease (HCC) 06/23/2022   Dermatofibroma 04/08/2022   Murmur 11/27/2021   Uterine prolapse 08/28/2019   Submandibular gland swelling 01/19/2018   Leg cramps 01/14/2016   Thyroid  nodule 05/15/2013   History of CVA (cerebrovascular accident) 05/08/2013   Hyperlipidemia associated with type 2 diabetes mellitus (HCC) 06/24/2010   Mild nonproliferative diabetic retinopathy (HCC) 02/04/2010   ALLERGIC URTICARIA 01/20/2009   Hypertension associated with type 2 diabetes mellitus (HCC) 01/03/2009    Orientation RESPIRATION BLADDER Height & Weight     Self  Normal Continent Weight: 57.9 kg Height:  5' 1 (154.9 cm)  BEHAVIORAL SYMPTOMS/MOOD NEUROLOGICAL BOWEL NUTRITION STATUS      Continent Diet (carb modified; 1600-2000 calories)  AMBULATORY STATUS COMMUNICATION OF NEEDS Skin   Limited Assist Verbally Normal (dry skin)                       Personal Care Assistance Level of  Assistance  Bathing, Feeding, Dressing Bathing Assistance: Limited assistance Feeding assistance: Independent Dressing Assistance: Limited assistance     Functional Limitations Info  Sight, Hearing, Speech Sight Info: Impaired (reading glasses) Hearing Info: Adequate Speech Info: Adequate    SPECIAL CARE FACTORS FREQUENCY  PT (By licensed PT), OT (By licensed OT)     PT Frequency: 5x/week OT Frequency: 5x/week            Contractures Contractures Info: Present    Additional Factors Info  Insulin  Sliding Scale Code Status Info: Full Code Allergies Info: Shellfish-derived Products   Insulin  Sliding Scale Info: See MAR       Current Medications (11/13/2023):  This is the current hospital active medication list Current Facility-Administered Medications  Medication Dose Route Frequency Provider Last Rate Last Admin   acetaminophen  (TYLENOL ) tablet 650 mg  650 mg Oral Q6H PRN Waddell Rake, MD   650 mg at 11/13/23 0244   Or   acetaminophen  (TYLENOL ) suppository 650 mg  650 mg Rectal Q6H PRN Waddell Rake, MD       aspirin  EC tablet 325 mg  325 mg Oral Daily Waddell Rake, MD   325 mg at 11/13/23 1009   atorvastatin  (LIPITOR) tablet 20 mg  20 mg Oral Daily Waddell Rake, MD   20 mg at 11/13/23 1009   feeding supplement (ENSURE PLUS HIGH PROTEIN) liquid 237 mL  237 mL Oral BID BM Waddell Rake, MD   237 mL at 11/12/23 0916   insulin  aspart (novoLOG ) injection 0-9 Units  0-9 Units Subcutaneous TID  WC Waddell Rake, MD   2 Units at 11/13/23 1305   ondansetron  (ZOFRAN ) tablet 4 mg  4 mg Oral Q6H PRN Waddell Rake, MD       Or   ondansetron  (ZOFRAN ) injection 4 mg  4 mg Intravenous Q6H PRN Waddell Rake, MD         Discharge Medications: Please see discharge summary for a list of discharge medications.  Relevant Imaging Results:  Relevant Lab Results:   Additional Information SSN [id number]  Sonda Manuella Quill, RN

## 2023-11-13 NOTE — TOC Initial Note (Addendum)
 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  -1630- faxed out for bed; awaiting bed offers; ins auth Expected Discharge Plan: Skilled Nursing Facility Barriers to Discharge: Continued Medical Work up   Patient Goals and CMS Choice Patient states their goals for this hospitalization and ongoing recovery are:: Shawn Muslim (dtr) said pt will d/c to SNF CMS Medicare.gov Compare Post Acute Care list provided to:: Patient Represenative (must comment) (Shawn Muslim (dtr))   [place] ownership interest in [facility].provided to:: Adult Children    Expected Discharge Plan and Services   Discharge Planning Services: CM Consult   Living arrangements for the past 2 months: Single Family Home                 DME Arranged: N/A DME Agency: NA       HH Arranged: NA HH Agency: NA        Prior Living Arrangements/Services Living arrangements for the past 2 months: Single Family Home Lives with:: Adult Children Patient language and need for interpreter reviewed:: Yes Do you feel safe going back to the place where you live?: Yes      Need for Family Participation in Patient Care: Yes (Comment) Care giver support system in place?: Yes (comment) Current home services:  (n/a) Criminal Activity/Legal Involvement Pertinent to Current  Situation/Hospitalization: No - Comment as needed  Activities of Daily Living   ADL Screening (condition at time of admission) Independently performs ADLs?: Yes (appropriate for developmental age) Is the patient deaf or have difficulty hearing?: No Does the patient have difficulty seeing, even when wearing glasses/contacts?: No Does the patient have difficulty concentrating, remembering, or making decisions?: Yes  Permission Sought/Granted Permission sought to share information with : Case Manager Permission granted to share information with : Yes, Verbal Permission Granted  Share Information with NAME: Case Manager     Permission granted to share info w Relationship: Shawn Muslim (dtr) [phone]     Emotional Assessment Appearance:: Appears stated age Attitude/Demeanor/Rapport: Unable to Assess Affect (typically observed): Unable to Assess Orientation: :  (unable to assess) Alcohol / Substance Use: Not Applicable Psych Involvement: No (comment)  Admission diagnosis:  Acute encephalopathy [G93.40] Urinary tract infection with hematuria, site unspecified [N39.0, R31.9] Altered mental status, unspecified altered mental status type [R41.82] Acute CVA (cerebrovascular accident) [facility]) [I63.9] Patient Active Problem List   Diagnosis Date Noted   Acute CVA (cerebrovascular accident) (HCC) 11/12/2023   Acute encephalopathy 11/11/2023   UTI (urinary tract infection) 11/11/2023   LBBB (left bundle branch block) 11/11/2023   Grieving 10/11/2023   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   Type 2 diabetes mellitus with vascular disease (HCC) 06/23/2022   Dermatofibroma 04/08/2022   Murmur 11/27/2021   Uterine prolapse 08/28/2019   Submandibular gland swelling 01/19/2018   Leg  cramps 01/14/2016   Thyroid  nodule 05/15/2013   History of CVA (cerebrovascular accident) 05/08/2013   Hyperlipidemia associated with type 2 diabetes mellitus (HCC) 06/24/2010   Mild nonproliferative diabetic  retinopathy (HCC) 02/04/2010   ALLERGIC URTICARIA 01/20/2009   Hypertension associated with type 2 diabetes mellitus (HCC) 01/03/2009   PCP:  Geofm Glade PARAS, MD Pharmacy:   [facility] [address] ([address]), [place] - [address] [place] ([place]) [place] [address] Phone: [phone] Fax: [phone]     Social Drivers of Health (SDOH) Social History: SDOH Screenings   Food Insecurity: No Food Insecurity (11/11/2023)  Housing: Low Risk  (11/11/2023)  Transportation Needs: No Transportation Needs (11/11/2023)  Utilities: Not At Risk (11/11/2023)  Alcohol Screen: Low Risk  (08/08/2023)  Depression (PHQ2-9): Medium Risk (10/11/2023)  Financial Resource Strain: Low Risk  (08/08/2023)  Physical Activity: Insufficiently Active (08/08/2023)  Social Connections: Moderately Integrated (11/11/2023)  Stress: No Stress Concern Present (08/08/2023)  Tobacco Use: Low Risk  (11/11/2023)  Health Literacy: Adequate Health Literacy (08/08/2023)   SDOH Interventions:     Readmission Risk Interventions     No data to display

## 2023-11-13 NOTE — Progress Notes (Signed)
 Monitor ordered for stroke  Dr. Pietro to read

## 2023-11-13 NOTE — Plan of Care (Signed)

## 2023-11-13 NOTE — Progress Notes (Addendum)
 Progress Note   Patient: Patricia Mccullough FMW:[id number] DOB: 11/11/1940 DOA: 11/11/2023     1 DOS: the patient was seen and examined on 11/13/2023   Brief hospital course: 83yo with h/o T2DM, HTN, HLD, TIA, and rectovaginal fistula who presented on 9/26 with AMS and aphasia.  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  Echo ordered.  Neurology consulted.  Also with tiny B SDH, neurosurgery is ok with starting ASA.  Cognition is slowly improving.  Assessment and Plan:  Acute CVA Patient presented with acute confusion x 2 days and speech difficulty, poor PO intake and agitation Admitted to telemetry  She has answered questions appropriately, but is slow and then has a hard time getting words out with intermittent confusion CT head with no acute findings but edited result with 3 mm SDH (seen on MRI) MRI brain with acute cortical and subadjacent white matter infarct in anterior R parietal lobe; also with small B SDH Echo with preserved EF, grade 1 DD Patient will need DAPT for 21 days when ABCD2 score is at least 4 and NIH score is 3 or less, and then can transition to monotherapy with a single antiplatelet agent.  Given SDH, will defer to neurology for now. Neurosurgery has approved ASA Neurology consulted PT/OT/ST (cognitive/language) Consults She lives alone and family is concerned that she may need STR Daughter reported that they desire [facility], but another family member is now requesting evaluation for CIR ASA monotherapy Recommended for outpatient LTM monitor, will ask cardiology to arrange  B SDH Telephone consult to neurosurgery OK to take ASA Possibly traumatic (mild scalp hematoma noted on CT) but patient and family are unaware of a recent fall   HTN Allow permissive HTN for now Treat BP only if >220/120, and then with goal of 15% reduction Hold amlodipine  and plan to restart in 48-72 hours   HLD 8/26 lipid panel:  119/77/24/91 LDL is at goal Continue atorvastatin  at current dose   DM 8/26 A1c 7.8, reasonable but not ideal control Hold metformin  Will order sensitive-scale SSI   Stage 3a CKD Appears to be stable at this time Attempt to avoid nephrotoxic medications Hold Jardiance  Recheck BMP in AM    Abnormal UA History of large rectovaginal fistula Complains of frequency/urgency but states not acute Culture pending Likely colonization in the setting of fistula   LBBB (left bundle branch block) New LBBB Troponin negative x 2 Echo with mild LVH of basal-septal segment   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[facility], from pessary trauma  Colorectal surgical approach to this defect would be either a simple loop colostomy or a proctectomy with end colostomy - but she does not want this She plans to reach out to Dr Sherma team to discuss options for symptoms of uterovaginal prolapse independent of colostomy           Consultants: PT OT   Procedures: Echocardiogram 9/27   Antibiotics: Ceftriaxone  9/26-    30 Day Unplanned Readmission Risk Score    Flowsheet Row ED to Hosp-Admission (Current) from 11/11/2023 in [facility]  30 Day Unplanned Readmission Risk Score (%) 10.22 Filed at 11/13/2023 0401    This score is the patient's risk of an unplanned readmission within 30 days of being discharged (0 -100%). The score is based on dignosis, age, lab data, medications, orders, and past utilization.   Low:  0-14.9   Medium: 15-21.9   High:  22-29.9   Extreme: 30 and above           Subjective: Still with jumbled thoughts but cognition and speech are slowly improving.  Daughter requested [facility] for STR.  Another family member (daughter called her) reports that they desire CIR if possible; she further reports that someone else she knows received 4 hours of SNF therapy a day and she is shocked that the patient would only be 1 hour a day in  SNF.   Objective: Vitals:   11/13/23 0442 11/13/23 1214  BP: (!) 163/77 127/65  Pulse: 63 71  Resp: 18 18  Temp: 97.9 F (36.6 C) 98.7 F (37.1 C)  SpO2: 98% 100%    Intake/Output Summary (Last 24 hours) at 11/13/2023 1541 Last data filed at 11/13/2023 1300 Gross per 24 hour  Intake 640 ml  Output --  Net 640 ml   Filed Weights   11/11/23 2310  Weight: 57.9 kg    Exam:  General:  Appears calm and comfortable and is in NAD Eyes:  normal lids, iris ENT:  grossly normal hearing, lips & tongue, mmm Cardiovascular:  RRR. No LE edema.  Respiratory:   CTA bilaterally with no wheezes/rales/rhonchi.  Normal respiratory effort. Abdomen:  soft, NT, ND Skin:  no rash or induration seen on limited exam Musculoskeletal:  grossly normal tone BUE/BLE, good ROM, no bony abnormality Psychiatric:  grossly normal mood and affect, speech mildly aphasic/confused Neurologic:  CN 2-12 grossly intact, moves all extremities in coordinated fashion  Data Reviewed: I have reviewed the patient's lab results since admission.  Pertinent labs for today include:   Glucose 163 WBC 7.7 Hgb 11.5     Family Communication: None present; I spoke with her daughter by telephone as well as another family member  Mobility: PT/OT Consulted and are recommending - Skilled Nursing-Short Term Rehab (<3 Hours/Day)11/12/2023 1654    Code Status: Full Code   Disposition: Status is: Inpatient Remains inpatient appropriate because: ongoing evaluation     Time spent: 50 minutes  Unresulted Labs (From admission, onward)     Start     Ordered   11/14/23 0500  CBC with Differential/Platelet  Tomorrow morning,   R        11/13/23 1539   11/14/23 0500  Basic metabolic panel with GFR  Tomorrow morning,   R        11/13/23 1539   11/11/23 2037  Culture, blood (Routine X 2) w Reflex to ID Panel  BLOOD CULTURE X 2,   R      11/11/23 2036   11/11/23 2029  Vitamin B1  Once,   R        11/11/23 2029              Author: Delon Herald, MD 11/13/2023 3:41 PM  For on call review [email or web address].

## 2023-11-13 NOTE — Evaluation (Signed)
 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   Type 2 diabetes mellitus with vascular disease (HCC) 06/23/2022   Dermatofibroma 04/08/2022   Murmur 11/27/2021   Uterine prolapse 08/28/2019   Submandibular gland swelling 01/19/2018   Leg cramps 01/14/2016   Thyroid  nodule 05/15/2013   History of CVA (cerebrovascular accident) 05/08/2013   Hyperlipidemia associated with type 2 diabetes mellitus (HCC) 06/24/2010   Mild nonproliferative diabetic retinopathy (HCC) 02/04/2010   ALLERGIC URTICARIA 01/20/2009   Hypertension associated with type 2 diabetes mellitus (HCC) 01/03/2009   Past Medical History:  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 MRI shows small bilateral subdural hematomas and acute cortical and subjacent white matter infarct in the anterior R parietal lobe. PMH: DM, HTN, HLD   Assessment / Plan /  Recommendation Clinical Impression  Pt states she notices delayed processing now compared to baseline. She was independent at baseline, caring for her son. She scored WFL on all subsections of the Cognistat except delayed recall, though increased wait time was necessary throughout. Pt demonstrates excellent awareness of CLOF and potential barriers to safety. Given level of independence at baseline, recommend ongoing SLP f/u, which can be addressed at her next venue of care. Will sign off acutely.    SLP Assessment  SLP Recommendation/Assessment: All further Speech Language Pathology needs can be addressed in the next venue of care SLP Visit Diagnosis: Cognitive communication deficit (R41.841)     Assistance Recommended at Discharge  Set up Supervision/Assistance  Functional Status Assessment Patient has had a recent decline in their functional status and demonstrates the ability to make significant improvements in function in a reasonable and predictable amount of time.  Frequency and Duration           SLP Evaluation Cognition  Overall Cognitive Status: Impaired/Different from baseline Arousal/Alertness: Awake/alert Orientation Level: Oriented X4 Attention: Selective Selective Attention: Appears intact Memory: Impaired Memory Impairment: Retrieval deficit Awareness: Appears intact Problem Solving: Appears intact Executive Function: Reasoning Reasoning: Appears intact       Comprehension  Auditory Comprehension Overall Auditory Comprehension: Appears within functional limits for tasks assessed    Expression Expression Primary Mode of Expression: Verbal Verbal Expression Overall Verbal Expression: Appears within functional limits for tasks assessed   Oral / Motor  Oral Motor/Sensory Function Overall Oral Motor/Sensory Function: Within functional limits Motor Speech Overall Motor Speech: Appears within functional limits for tasks assessed  Damien Blumenthal, M.A.,  CCC-SLP Speech Language Pathology, Acute Rehabilitation Services  Secure Chat preferred [phone]  11/13/2023, 2:40 PM

## 2023-11-13 NOTE — NC FL2 (Signed)
 [place]  MEDICAID FL2 LEVEL OF CARE FORM     IDENTIFICATION  Patient Name: Patricia Mccullough Birthdate: [birth year] Sex: female Admission Date (Current Location): 11/11/2023  [facility] and [place] Number:  Producer, television/film/video and Address:  [facility],  [address], [place] [address]      Provider Number: [id number]  Attending Physician Name and Address:  Barbarann Nest, MD  Relative Name and Phone Number:  Elouise Muslim (daughter) [phone]    Current Level of Care: Hospital Recommended Level of Care: Skilled Nursing Facility Prior Approval Number:    Date Approved/Denied:   PASRR Number: [id number]  Discharge Plan: SNF    Current Diagnoses: Patient Active Problem List   Diagnosis Date Noted   Acute CVA (cerebrovascular accident) (HCC) 11/12/2023   Acute encephalopathy 11/11/2023   UTI (urinary tract infection) 11/11/2023   LBBB (left bundle branch block) 11/11/2023   Grieving 10/11/2023   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   Type 2 diabetes mellitus with vascular disease (HCC) 06/23/2022   Dermatofibroma 04/08/2022   Murmur 11/27/2021   Uterine prolapse 08/28/2019   Submandibular gland swelling 01/19/2018   Leg cramps 01/14/2016   Thyroid  nodule 05/15/2013   History of CVA (cerebrovascular accident) 05/08/2013   Hyperlipidemia associated with type 2 diabetes mellitus (HCC) 06/24/2010   Mild nonproliferative diabetic retinopathy (HCC) 02/04/2010   ALLERGIC URTICARIA 01/20/2009   Hypertension associated with type 2 diabetes mellitus (HCC) 01/03/2009    Orientation RESPIRATION BLADDER Height & Weight     Self  Normal Continent Weight: 57.9 kg Height:  5' 1 (154.9 cm)  BEHAVIORAL SYMPTOMS/MOOD NEUROLOGICAL BOWEL NUTRITION STATUS      Continent Diet (carb modified; 1600-2000 calories)  AMBULATORY STATUS COMMUNICATION OF NEEDS Skin   Limited Assist Verbally Normal (dry skin)                       Personal Care Assistance Level of  Assistance  Bathing, Feeding, Dressing Bathing Assistance: Limited assistance Feeding assistance: Independent Dressing Assistance: Limited assistance     Functional Limitations Info  Sight, Hearing, Speech Sight Info: Impaired (reading glasses) Hearing Info: Adequate Speech Info: Adequate    SPECIAL CARE FACTORS FREQUENCY  PT (By licensed PT), OT (By licensed OT)     PT Frequency: 5x/week OT Frequency: 5x/week            Contractures Contractures Info: Present    Additional Factors Info  Code Status, Allergies Code Status Info: Full Code Allergies Info: Shellfish-derived Products           Current Medications (11/13/2023):  This is the current hospital active medication list Current Facility-Administered Medications  Medication Dose Route Frequency Provider Last Rate Last Admin   acetaminophen  (TYLENOL ) tablet 650 mg  650 mg Oral Q6H PRN Waddell Rake, MD   650 mg at 11/13/23 0244   Or   acetaminophen  (TYLENOL ) suppository 650 mg  650 mg Rectal Q6H PRN Waddell Rake, MD       aspirin  EC tablet 325 mg  325 mg Oral Daily Waddell Rake, MD   325 mg at 11/13/23 1009   atorvastatin  (LIPITOR) tablet 20 mg  20 mg Oral Daily Waddell Rake, MD   20 mg at 11/13/23 1009   feeding supplement (ENSURE PLUS HIGH PROTEIN) liquid 237 mL  237 mL Oral BID BM Waddell Rake, MD   237 mL at 11/12/23 0916   insulin  aspart (novoLOG ) injection 0-9 Units  0-9 Units Subcutaneous TID WC Waddell Rake, MD  2 Units at 11/13/23 1305   ondansetron  (ZOFRAN ) tablet 4 mg  4 mg Oral Q6H PRN Waddell Rake, MD       Or   ondansetron  (ZOFRAN ) injection 4 mg  4 mg Intravenous Q6H PRN Waddell Rake, MD         Discharge Medications: Please see discharge summary for a list of discharge medications.  Relevant Imaging Results:  Relevant Lab Results:   Additional Information SSN [id number]  Sonda Manuella Quill, RN

## 2023-11-14 DIAGNOSIS — G9340 Encephalopathy, unspecified: Secondary | ICD-10-CM | POA: Diagnosis not present

## 2023-11-14 LAB — GLUCOSE, CAPILLARY
Glucose-Capillary: 188 mg/dL — ABNORMAL HIGH (ref 70–99)
Glucose-Capillary: 196 mg/dL — ABNORMAL HIGH (ref 70–99)
Glucose-Capillary: 301 mg/dL — ABNORMAL HIGH (ref 70–99)

## 2023-11-14 LAB — URINE CULTURE: Culture: >=100000 — AB

## 2023-11-14 LAB — BASIC METABOLIC PANEL WITH GFR
Anion gap: 13 (ref 5–15)
BUN: 18 mg/dL (ref 8–23)
CO2: 21 mmol/L — ABNORMAL LOW (ref 22–32)
Calcium: 9.6 mg/dL (ref 8.9–10.3)
Chloride: 105 mmol/L (ref 98–111)
Creatinine, Ser: 0.87 mg/dL (ref 0.44–1.00)
GFR, Estimated: >60 mL/min (ref >60)
Glucose, Bld: 170 mg/dL — ABNORMAL HIGH (ref 70–99)
Potassium: 4.1 mmol/L (ref 3.5–5.1)
Sodium: 139 mmol/L (ref 135–145)

## 2023-11-14 LAB — CBC WITH DIFFERENTIAL/PLATELET
Abs Immature Granulocytes: 0.05 K/uL (ref 0.00–0.07)
Basophils Absolute: 0.1 K/uL (ref 0.0–0.1)
Basophils Relative: 1 %
Eosinophils Absolute: 0.5 K/uL (ref 0.0–0.5)
Eosinophils Relative: 6 %
HCT: 37.9 % (ref 36.0–46.0)
Hemoglobin: 11.9 g/dL — ABNORMAL LOW (ref 12.0–15.0)
Immature Granulocytes: 1 %
Lymphocytes Relative: 37 %
Lymphs Abs: 3 K/uL (ref 0.7–4.0)
MCH: 30.1 pg (ref 26.0–34.0)
MCHC: 31.4 g/dL (ref 30.0–36.0)
MCV: 95.9 fL (ref 80.0–100.0)
Monocytes Absolute: 0.7 K/uL (ref 0.1–1.0)
Monocytes Relative: 8 %
Neutro Abs: 3.9 K/uL (ref 1.7–7.7)
Neutrophils Relative %: 47 %
Platelets: 272 K/uL (ref 150–400)
RBC: 3.95 MIL/uL (ref 3.87–5.11)
RDW: 13.7 % (ref 11.5–15.5)
WBC: 8.2 K/uL (ref 4.0–10.5)
nRBC: 0 % (ref 0.0–0.2)

## 2023-11-14 MED ORDER — STROKE: EARLY STAGES OF RECOVERY BOOK
Freq: Once | Status: AC
  Filled 2023-11-14: qty 1

## 2023-11-14 NOTE — TOC Progression Note (Addendum)
 Transition of Care [facility]) - Progression Note    Patient Details  Name: Patricia Mccullough MRN: [id number] Date of Birth: Feb 12, 1941  [facility] of [facility]) CM/SW Contact  Heather DELENA Saltness, LCSW Phone Number: 11/14/2023, 2:55 PM  Clinical Narrative:    CSW spoke with pt's daughter, Elouise Muslim [phone], via phone call to discuss SNF bed availability and obtain facility choice. CSW provided pt's daughter with list of facilities with available beds including name of facility, location, and Medicare Star-Ratings. Pt's daughter chooses bed at [facility]. CSW notified [place] [facility] [place] to inform of acceptance. Insurance authorization requested, currently pending.   Medicare [facility] [address] [place], [place] [address] [phone] Overall rating ???? Above average  [facility] [address] [place], [place] [address] [phone] Overall rating ?? Much below average  [facility] [address] [place], [place] [address] [phone] Overall rating? Below average  [facility] [address] [place], [place] [address] [phone] Overall rating ?? Below average  [facility] [address] [place], [place] [address] [phone] Overall rating ? Much below average  [facility] [address] [place], [place] [address] [phone] Overall rating ??? Much above average  [facility] [address] [place], [place] [address] [phone] Overall rating ?? Much below average  [facility] [address] [place], [place] [address] [phone] Overall rating ?? Much below average  [facility] [address] [place], [place] [address] [phone] Overall rating ? Much  below average  [facility] [address] [place], [place] [address] [phone] Overall rating ? Much below average  [employer] [facility] [employer] [address] [place], [place] [address] [phone] Overall rating ? Average  Expected Discharge Plan: Skilled Nursing Facility Barriers to Discharge: Continued Medical Work up   Expected Discharge Plan and Services   Discharge Planning Services: CM Consult   Living arrangements for the past 2 months: Single Family Home                 DME Arranged: N/A DME Agency: NA       HH Arranged: NA HH Agency: NA         Social Drivers of Health (SDOH) Interventions SDOH Screenings   Food Insecurity: No Food Insecurity (11/11/2023)  Housing: Low Risk  (11/11/2023)  Transportation Needs: No Transportation Needs (11/11/2023)  Utilities: Not At Risk (11/11/2023)  Alcohol Screen: Low Risk  (08/08/2023)  Depression (PHQ2-9): Medium Risk (10/11/2023)  Financial Resource Strain: Low Risk  (08/08/2023)  Physical Activity: Insufficiently Active (08/08/2023)  Social Connections: Moderately Integrated (11/11/2023)  Stress: No Stress Concern Present (08/08/2023)  Tobacco Use: Low Risk  (11/11/2023)  Health Literacy: Adequate Health Literacy (08/08/2023)    Readmission Risk Interventions     No data to display          Signed: Heather Saltness, MSW, LCSW Clinical Social Worker Inpatient Care Management 11/14/2023 3:00 PM

## 2023-11-14 NOTE — Progress Notes (Signed)
 I attest to student documentation.  Carrick Rijos, MSN-RN Clinical Instructor/Nursing Faculty [facility]

## 2023-11-14 NOTE — Evaluation (Signed)
 Occupational Therapy Evaluation Patient Details Name: Patricia Mccullough MRN: [id number] DOB: 05/27/1940 Today's Date: 11/14/2023   History of Present Illness   83yo with who presented on 9/26 with AMS and aphasia. 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 Neurology consulted. Also with tiny B SDH, neurosurgery is ok with starting ASA. h/o T2DM, HTN, HLD, TIA, and rectovaginal fistula who presented on 9/26 with AMS and aphasia.  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 Neurology consulted.  Also with tiny B SDH, neurosurgery is ok with starting ASA.     Clinical Impressions Patient admitted for the diagnosis above.  PTA she lives at home with her son, whom she cares for.  Patient needed no assist for ADL,iADL or mobility.  Presents with the deficits listed below, currently needing Min A for in room mobility/toileting and ADL completion from a sit to stand level.  OT will continue efforts in the acute setting to address deficits and Patient will benefit from continued inpatient follow up therapy, <3 hours/day.     If plan is discharge home, recommend the following:   Assist for transportation;A little help with walking and/or transfers;A little help with bathing/dressing/bathroom     Functional Status Assessment   Patient has had a recent decline in their functional status and demonstrates the ability to make significant improvements in function in a reasonable and predictable amount of time.     Equipment Recommendations   Tub/shower seat     Recommendations for Other Services         Precautions/Restrictions   Precautions Precautions: Fall Precaution/Restrictions Comments: permissive HTN for now--11/12/23 Restrictions Weight Bearing Restrictions Per Provider Order: No     Mobility Bed Mobility               General bed mobility comments: up in  recliner    Transfers Overall transfer level: Needs assistance Equipment used: Rolling walker (2 wheels) Transfers: Sit to/from Stand, Bed to chair/wheelchair/BSC Sit to Stand: Contact guard assist     Step pivot transfers: Min assist            Balance Overall balance assessment: Needs assistance Sitting-balance support: No upper extremity supported, Feet supported Sitting balance-Leahy Scale: Good     Standing balance support: Single extremity supported Standing balance-Leahy Scale: Poor                             ADL either performed or assessed with clinical judgement   ADL Overall ADL's : Needs assistance/impaired Eating/Feeding: Independent;Sitting   Grooming: Contact guard assist;Standing   Upper Body Bathing: Supervision/ safety;Sitting   Lower Body Bathing: Minimal assistance;Sit to/from stand   Upper Body Dressing : Supervision/safety;Sitting   Lower Body Dressing: Minimal assistance;Sit to/from stand   Toilet Transfer: Minimal Holiday representative;Ambulation                   Vision Patient Visual Report: No change from baseline       Perception Perception: Within Functional Limits       Praxis Praxis: WFL       Pertinent Vitals/Pain Pain Assessment Pain Assessment: Faces Faces Pain Scale: Hurts a little bit Pain Location: head Pain Descriptors / Indicators: Headache Pain Intervention(s): Monitored during session     Extremity/Trunk Assessment Upper Extremity Assessment Upper Extremity Assessment: Overall WFL for tasks assessed  Lower Extremity Assessment Lower Extremity Assessment: Defer to PT evaluation   Cervical / Trunk Assessment Cervical / Trunk Assessment: Normal   Communication Communication Communication: No apparent difficulties   Cognition Arousal: Alert Behavior During Therapy: WFL for tasks assessed/performed Cognition: Cognition impaired   Orientation impairments: Situation   Memory  impairment (select all impairments): Short-term memory Attention impairment (select first level of impairment): Selective attention                     Following commands: Intact       Cueing  General Comments   Cueing Techniques: Verbal cues      Exercises     Shoulder Instructions      Home Living Family/patient expects to be discharged to:: Private residence Living Arrangements: Children Available Help at Discharge: Family;Available PRN/intermittently Type of Home: House Home Access: Stairs to enter Entrance Stairs-Number of Steps: 3   Home Layout: One level     Bathroom Shower/Tub: Tub/shower unit;Walk-in shower   Bathroom Toilet: Standard     Home Equipment: [place] - single point      Lives With: Son    Prior Functioning/Environment Prior Level of Function : Independent/Modified Independent             Mobility Comments: does not use device ADLs Comments: does all ADLs and iADLs    OT Problem List: Impaired balance (sitting and/or standing);Decreased safety awareness   OT Treatment/Interventions: Self-care/ADL training;Therapeutic activities;Balance training;DME and/or AE instruction      OT Goals(Current goals can be found in the care plan section)   Acute Rehab OT Goals Patient Stated Goal: Return home OT Goal Formulation: With patient Time For Goal Achievement: 11/28/23 Potential to Achieve Goals: Good ADL Goals Pt Will Perform Grooming: with modified independence;standing Pt Will Perform Lower Body Dressing: with modified independence;sit to/from stand Pt Will Transfer to Toilet: with modified independence;ambulating;regular height toilet   OT Frequency:  Min 2X/week    Co-evaluation              AM-PAC OT 6 Clicks Daily Activity     Outcome Measure Help from another person eating meals?: None Help from another person taking care of personal grooming?: A Little Help from another person toileting, which includes using  toliet, bedpan, or urinal?: A Little Help from another person bathing (including washing, rinsing, drying)?: A Little Help from another person to put on and taking off regular upper body clothing?: A Little Help from another person to put on and taking off regular lower body clothing?: A Little 6 Click Score: 19   End of Session Equipment Utilized During Treatment: Gait belt Nurse Communication: Mobility status  Activity Tolerance: Patient tolerated treatment well Patient left: in chair;with call bell/phone within reach;with family/visitor present  OT Visit Diagnosis: Unsteadiness on feet (R26.81);History of falling (Z91.81)                Time: 8689-8667 OT Time Calculation (min): 22 min Charges:  OT General Charges $OT Visit: 1 Visit OT Evaluation $OT Eval Moderate Complexity: 1 Mod  11/14/2023  RP, OTR/L  Acute Rehabilitation Services  Office:  [phone]   Charlie JONETTA Halsted 11/14/2023, 1:43 PM

## 2023-11-14 NOTE — Plan of Care (Signed)
  Problem: Education: Goal: Ability to describe self-care measures that may prevent or decrease complications (Diabetes Survival Skills Education) will improve Outcome: Progressing   Problem: Coping: Goal: Ability to adjust to condition or change in health will improve Outcome: Progressing

## 2023-11-14 NOTE — Progress Notes (Signed)
 Progress Note   Patient: Patricia Mccullough FMW:[id number] DOB: Jul 08, 1940 DOA: 11/11/2023     2 DOS: the patient was seen and examined on 11/14/2023   Brief hospital course: 83yo with h/o T2DM, HTN, HLD, TIA, and rectovaginal fistula who presented on 9/26 with AMS and aphasia.  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  Echo ordered.  Neurology consulted.  Also with tiny B SDH, neurosurgery is ok with starting ASA.  Cognition is slowly improving.  Medically stable for transfer to STR when possible.  Assessment and Plan:  Acute CVA Patient presented with acute confusion x 2 days and speech difficulty, poor PO intake and agitation Admitted to telemetry  She has answered questions appropriately, but is slow and then has a hard time getting words out with intermittent confusion - this is significantly improving and her recovery prognosis is very optimistic CT head with no acute findings but edited result with 3 mm SDH (seen on MRI) MRI brain with acute cortical and subadjacent white matter infarct in anterior R parietal lobe; also with small B SDH Echo with preserved EF, grade 1 DD Neurosurgery has approved ASA monotherapy Neurology consulted PT/OT/ST (cognitive/language) Consults She lives alone and family is concerned that she may need STR PT/OT recommending STR, family has selected [facility] Recommended for outpatient LTM monitor, cardiology has arranged   B SDH Telephone consult to neurosurgery OK to take ASA Possibly traumatic (mild scalp hematoma noted on CT) but patient and family are unaware of a recent fall   HTN Allowed permissive HTN, now out of the window Holding amlodipine  and BP is generally controlled for now so will continue to hold   HLD 8/26 lipid panel: 119/77/24/91 LDL is at goal Continue atorvastatin  at current dose   DM 8/26 A1c 7.8, reasonable but not ideal control Hold metformin  Will order  sensitive-scale SSI   Stage 3a CKD Appears to be stable at this time Attempt to avoid nephrotoxic medications Hold Jardiance  Recheck BMP in AM    Abnormal UA History of large rectovaginal fistula Complains of frequency/urgency but states not acute Culture pending Likely colonization in the setting of fistula   LBBB (left bundle branch block) New LBBB Troponin negative x 2 Echo with mild LVH of basal-septal segment   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Followed at [facility], from pessary trauma  Colorectal surgical approach to this defect would be either a simple loop colostomy or a proctectomy with end colostomy - but she does not want this She plans to reach out to Dr Sherma team to discuss options for symptoms of uterovaginal prolapse independent of colostomy           Consultants: PT OT   Procedures: Echocardiogram 9/27   Antibiotics: Ceftriaxone  9/26-     30 Day Unplanned Readmission Risk Score    Flowsheet Row ED to Hosp-Admission (Current) from 11/11/2023 in [facility]  30 Day Unplanned Readmission Risk Score (%) 9.12 Filed at 11/14/2023 0400    This score is the patient's risk of an unplanned readmission within 30 days of being discharged (0 -100%). The score is based on dignosis, age, lab data, medications, orders, and past utilization.   Low:  0-14.9   Medium: 15-21.9   High: 22-29.9   Extreme: 30 and above           Subjective: Her cognition is improving.  She was able to have more extensive conversation today with only  mild cognitive slowing and periodic word finding difficulty.  Her granddaughter reports that she is still far off her baseline, but she is definitely improving.   Objective: Vitals:   11/14/23 1013 11/14/23 1414  BP: 115/66 (!) 120/59  Pulse: 74 69  Resp:  18  Temp: 98.2 F (36.8 C) (!) 97.5 F (36.4 C)  SpO2: 100% 100%    Intake/Output Summary (Last 24 hours) at 11/14/2023 1546 Last data filed at  11/13/2023 2018 Gross per 24 hour  Intake 100 ml  Output 200 ml  Net -100 ml   Filed Weights   11/11/23 2310  Weight: 57.9 kg    Exam:  General:  Appears calm and comfortable and is in NAD Eyes:  normal lids, iris ENT:  grossly normal hearing, lips & tongue, mmm Cardiovascular:  RRR. No LE edema.  Respiratory:   CTA bilaterally with no wheezes/rales/rhonchi.  Normal respiratory effort. Abdomen:  soft, NT, ND Skin:  no rash or induration seen on limited exam Musculoskeletal:  grossly normal tone BUE/BLE, good ROM, no bony abnormality Psychiatric:  grossly normal mood and affect, speech mildly aphasic/confused Neurologic:  CN 2-12 grossly intact, moves all extremities in coordinated fashion  Data Reviewed: I have reviewed the patient's lab results since admission.  Pertinent labs for today include:   Stable BMP Glucose 170 Stable CBC     Family Communication: Granddaughter was present  Mobility: PT/OT Consulted and are recommending - Skilled Nursing-Short Term Rehab (<3 Hours/Day)11/12/2023 1654    Code Status: Full Code   Disposition: Status is: Inpatient Remains inpatient appropriate because: awaiting placement     Time spent: 50 minutes  Unresulted Labs (From admission, onward)     Start     Ordered   11/11/23 2037  Culture, blood (Routine X 2) w Reflex to ID Panel  BLOOD CULTURE X 2,   R (with TIMED occurrences),   Status:  Canceled      11/11/23 2036   11/11/23 2029  Vitamin B1  Once,   R        11/11/23 2029             Author: Delon Herald, MD 11/14/2023 3:46 PM  For on call review [email or web address].

## 2023-11-15 DIAGNOSIS — N390 Urinary tract infection, site not specified: Secondary | ICD-10-CM | POA: Diagnosis not present

## 2023-11-15 DIAGNOSIS — I639 Cerebral infarction, unspecified: Secondary | ICD-10-CM | POA: Diagnosis not present

## 2023-11-15 DIAGNOSIS — I152 Hypertension secondary to endocrine disorders: Secondary | ICD-10-CM | POA: Diagnosis not present

## 2023-11-15 DIAGNOSIS — Z8673 Personal history of transient ischemic attack (TIA), and cerebral infarction without residual deficits: Secondary | ICD-10-CM | POA: Diagnosis not present

## 2023-11-15 DIAGNOSIS — R2689 Other abnormalities of gait and mobility: Secondary | ICD-10-CM | POA: Diagnosis not present

## 2023-11-15 DIAGNOSIS — S065X0A Traumatic subdural hemorrhage without loss of consciousness, initial encounter: Secondary | ICD-10-CM | POA: Diagnosis not present

## 2023-11-15 DIAGNOSIS — M6281 Muscle weakness (generalized): Secondary | ICD-10-CM | POA: Diagnosis not present

## 2023-11-15 DIAGNOSIS — R638 Other symptoms and signs concerning food and fluid intake: Secondary | ICD-10-CM | POA: Diagnosis not present

## 2023-11-15 DIAGNOSIS — E785 Hyperlipidemia, unspecified: Secondary | ICD-10-CM | POA: Diagnosis not present

## 2023-11-15 DIAGNOSIS — E1159 Type 2 diabetes mellitus with other circulatory complications: Secondary | ICD-10-CM | POA: Diagnosis not present

## 2023-11-15 DIAGNOSIS — R4701 Aphasia: Secondary | ICD-10-CM | POA: Diagnosis not present

## 2023-11-15 DIAGNOSIS — I447 Left bundle-branch block, unspecified: Secondary | ICD-10-CM | POA: Diagnosis not present

## 2023-11-15 DIAGNOSIS — E1169 Type 2 diabetes mellitus with other specified complication: Secondary | ICD-10-CM | POA: Diagnosis not present

## 2023-11-15 DIAGNOSIS — I10 Essential (primary) hypertension: Secondary | ICD-10-CM | POA: Diagnosis not present

## 2023-11-15 DIAGNOSIS — L500 Allergic urticaria: Secondary | ICD-10-CM | POA: Diagnosis not present

## 2023-11-15 DIAGNOSIS — G9340 Encephalopathy, unspecified: Secondary | ICD-10-CM | POA: Diagnosis not present

## 2023-11-15 LAB — GLUCOSE, CAPILLARY
Glucose-Capillary: 193 mg/dL — ABNORMAL HIGH (ref 70–99)
Glucose-Capillary: 191 mg/dL — ABNORMAL HIGH (ref 70–99)

## 2023-11-15 LAB — VITAMIN B1: Vitamin B1 (Thiamine): 106.8 nmol/L (ref 66.5–200.0)

## 2023-11-15 MED ORDER — ENSURE PLUS HIGH PROTEIN PO LIQD: 237.0000 mL | Freq: Two times a day (BID) | ORAL | Status: DC

## 2023-11-15 MED ORDER — ASPIRIN 325 MG PO TBEC: 325.0000 mg | DELAYED_RELEASE_TABLET | Freq: Every day | ORAL | Status: AC

## 2023-11-15 NOTE — TOC Transition Note (Signed)
 Transition of Care [facility]) - Discharge Note   Patient Details  Name: Patricia Mccullough MRN: [id number] Date of Birth: [birth year]  Transition of Care [facility]) CM/SW Contact:  Heather DELENA Saltness, LCSW Phone Number: 11/15/2023, 1:13 PM   Clinical Narrative:    Pt discharging to [facility] today for short-term rehab. Pt accepted to room 603A. D/C packet placed in pt's chart at RN station. RN to call report to [phone]. PTAR called at 2:02 PM. Pt and daughter, Patricia Mccullough, made aware and in agreement with discharge plan. No further TOC needs at this time.   Final next level of care: Skilled Nursing Facility Barriers to Discharge: Barriers Resolved   Patient Goals and CMS Choice Patient states their goals for this hospitalization and ongoing recovery are:: Go to [facility] for SNF CMS Medicare.gov Compare Post Acute Care list provided to:: Patient Represenative (must comment) Choice offered to / list presented to : Adult Children [facility] ownership interest in [facility].provided to:: Adult Children    Discharge Placement  [facility]            Patient chooses bed at: [facility] Patient to be transferred to facility by: PTAR Name of family member notified: Patricia Mccullough, daughter Patient and family notified of of transfer: 11/15/23  Discharge Plan and Services Additional resources added to the After Visit Summary for  Follow Up   Discharge Planning Services: CM Consult            DME Arranged: N/A DME Agency: NA       HH Arranged: NA HH Agency: NA        Social Drivers of Health (SDOH) Interventions SDOH Screenings   Food Insecurity: No Food Insecurity (11/11/2023)  Housing: Low Risk  (11/11/2023)  Transportation Needs: No Transportation Needs (11/11/2023)  Utilities: Not At Risk (11/11/2023)  Alcohol Screen: Low Risk  (08/08/2023)  Depression (PHQ2-9): Medium Risk (10/11/2023)  Financial Resource Strain: Low Risk  (08/08/2023)  Physical Activity:  Insufficiently Active (08/08/2023)  Social Connections: Moderately Integrated (11/11/2023)  Stress: No Stress Concern Present (08/08/2023)  Tobacco Use: Low Risk  (11/11/2023)  Health Literacy: Adequate Health Literacy (08/08/2023)     Readmission Risk Interventions     No data to display          Signed: Heather Saltness, MSW, LCSW Clinical Social Worker Inpatient Care Management 11/15/2023 2:03 PM

## 2023-11-15 NOTE — Progress Notes (Signed)
 Inpatient Rehab Admissions Coordinator:   Note plans to d/c to SNF today.  Will sign off for CIR at this time.   Reche Lowers, PT, DPT Admissions Coordinator [phone] 11/15/23  1:53 PM

## 2023-11-15 NOTE — Plan of Care (Signed)
  Problem: Education: Goal: Ability to describe self-care measures that may prevent or decrease complications (Diabetes Survival Skills Education) will improve Outcome: Progressing   Problem: Health Behavior/Discharge Planning: Goal: Ability to manage health-related needs will improve Outcome: Progressing   

## 2023-11-15 NOTE — Progress Notes (Signed)
 Physical Therapy Treatment Patient Details Name: Patricia Mccullough MRN: [id number] DOB: [birth year] Today's Date: 11/15/2023   History of Present Illness 83yo with who presented on 9/26 with AMS and aphasia. 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 Neurology consulted. Also with tiny B SDH, neurosurgery is ok with starting ASA. h/o T2DM, HTN, HLD, TIA, and rectovaginal fistula who presented on 9/26 with AMS and aphasia.  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 Neurology consulted.  Also with tiny B SDH, neurosurgery is ok with starting ASA.    PT Comments  Pt agreeable to therapy, daughter in from [place] at bedside offering encouragement. Pt declines RW initially, min A to steady once standing as pt reaching for bed and therapist to steady self. Pt then agreeable to RW and able to amb 200 ft with RW with 1 standing rest break at halfway. Pt with equal bil step length and minimal bil foot clearance, pt needing intermittent cues to maintain body within RW frame and min A to clear past obstacles at safe distance. Upon return to room pt requests bathroom to void bladder. Pt needing CGA for transfers and sequencing cues to complete self care. Pt needing increased time to follow commands and respond, reports not back to baseline yet, also daughter at bedside reports improvement but still not at baseline which is independent. Pt returns to supine requesting to take a nap at [facility] with daughter at bedside and all needs in reach.   If plan is discharge home, recommend the following: A little help with walking and/or transfers;A little help with bathing/dressing/bathroom;Help with stairs or ramp for entrance;Assist for transportation   Can travel by private vehicle     Yes  Equipment Recommendations  None recommended by PT (defer to next venue)    Recommendations for Other Services        Precautions / Restrictions Precautions Precautions: Fall Precaution/Restrictions Comments: permissive HTN for now--11/12/23 Restrictions Weight Bearing Restrictions Per Provider Order: No     Mobility  Bed Mobility Overal bed mobility: Needs Assistance Bed Mobility: Supine to Sit, Sit to Supine     Supine to sit: Supervision, Used rails, HOB elevated Sit to supine: Supervision, Used rails   General bed mobility comments: therapist managing linen, pt using bedrail and elevated HOB to self assist body to EOB and return to supine    Transfers Overall transfer level: Needs assistance Equipment used: Rolling walker (2 wheels), None Transfers: Sit to/from Stand Sit to Stand: Contact guard assist, Min assist           General transfer comment: slow to power to stand from bedside and toilet, initial transfer without AD and pt reaching for bed and therapist requiring min A to steady; other reps wtih RW and pt completes with BUE assisting to power up, CGA for safety    Ambulation/Gait Ambulation/Gait assistance: Min assist, Contact guard assist Gait Distance (Feet): 200 Feet Assistive device: Rolling walker (2 wheels) Gait Pattern/deviations: Step-through pattern, Decreased stride length Gait velocity: decreased     General Gait Details: step through gait pattern with symmetrical step length and foot clearance, cues to maintain body within RW frame, assist to clear past obstacles at safe distance as pt close to wall and furniture   Stairs             Wheelchair Mobility     Tilt Bed  Modified Rankin (Stroke Patients Only)       Balance Overall balance assessment: Needs assistance Sitting-balance support: No upper extremity supported, Feet supported Sitting balance-Leahy Scale: Good     Standing balance support: During functional activity, Reliant on assistive device for balance, Bilateral upper extremity supported Standing balance-Leahy Scale: Poor                               Communication Communication Communication: No apparent difficulties  Cognition Arousal: Lethargic Behavior During Therapy: WFL for tasks assessed/performed                           PT - Cognition Comments: pt reports tired, requests to take a nap at end of session, increased time to respond to questions and follow commands, daughter at bedside reports improvement but not baseline Following commands: Intact      Cueing Cueing Techniques: Verbal cues  Exercises General Exercises - Lower Extremity Long Arc Quad: AROM, Both, 10 reps, Seated Hip Flexion/Marching: Both, 10 reps, AROM, Seated    General Comments        Pertinent Vitals/Pain Pain Assessment Pain Assessment: No/denies pain    Home Living                          Prior Function            PT Goals (current goals can now be found in the care plan section) Acute Rehab PT Goals Patient Stated Goal: back to ind PT Goal Formulation: With patient Time For Goal Achievement: 11/25/23 Potential to Achieve Goals: Good Progress towards PT goals: Progressing toward goals    Frequency    Min 3X/week      PT Plan      Co-evaluation              AM-PAC PT 6 Clicks Mobility   Outcome Measure  Help needed turning from your back to your side while in a flat bed without using bedrails?: A Little Help needed moving from lying on your back to sitting on the side of a flat bed without using bedrails?: A Little Help needed moving to and from a bed to a chair (including a wheelchair)?: A Little Help needed standing up from a chair using your arms (e.g., wheelchair or bedside chair)?: A Little Help needed to walk in hospital room?: A Little Help needed climbing 3-5 steps with a railing? : A Lot 6 Click Score: 17    End of Session Equipment Utilized During Treatment: Gait belt Activity Tolerance: Patient tolerated treatment well Patient left: in  bed;with call bell/phone within reach;with bed alarm set;with family/visitor present Nurse Communication: Mobility status PT Visit Diagnosis: Other abnormalities of gait and mobility (R26.89)     Time: 1311-1340 PT Time Calculation (min) (ACUTE ONLY): 29 min  Charges:    $Gait Training: 8-22 mins $Therapeutic Activity: 8-22 mins PT General Charges $$ ACUTE PT VISIT: 1 Visit                     Tori Kienan Doublin PT, DPT 11/15/23, 3:58 PM

## 2023-11-15 NOTE — Discharge Summary (Signed)
 Physician Discharge Summary   Patient: Patricia Mccullough MRN: [id number] DOB: 10/20/1940  Admit date:     11/11/2023  Discharge date: 11/15/23  Discharge Physician: Delon Herald   PCP: Geofm Glade PARAS, MD   Recommendations at discharge:   You are being discharged to [facility] for rehabilitation Continue full dose aspirin  (325 mg) daily indefinitely Cardiology is arranging for an outpatient long-term cardiac monitor (Zio patch) Follow up with Dr. Geofm after discharge from rehab  Discharge Diagnoses: Principal Problem:   Acute CVA (cerebrovascular accident) [facility]) Active Problems:   Hypertension associated with type 2 diabetes mellitus (HCC)   Type 2 diabetes mellitus with vascular disease (HCC)   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Hyperlipidemia associated with type 2 diabetes mellitus [facility])    Hospital Course: 83yo with h/o T2DM, HTN, HLD, TIA, and rectovaginal fistula who presented on 9/26 with AMS and aphasia.  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  Echo ordered.  Neurology consulted.  Also with tiny B SDH, neurosurgery is ok with starting ASA.  Cognition is slowly improving.  Medically stable for transfer to STR when possible.  Assessment and Plan:  Acute CVA Patient presented with acute confusion x 2 days and speech difficulty, poor PO intake and agitation Admitted to telemetry  She has answered questions appropriately, but is slow and then has a hard time getting words out with intermittent confusion - this is significantly improving and her recovery prognosis is very optimistic CT head with no acute findings but edited result with 3 mm SDH (seen on MRI) MRI brain with acute cortical and subadjacent white matter infarct in anterior R parietal lobe; also with small B SDH Echo with preserved EF, grade 1 DD Neurosurgery has approved ASA monotherapy Neurology consulted PT/OT/ST (cognitive/language) Consults She lives alone and  family is concerned that she may need STR PT/OT recommending STR, family has selected [facility] Recommended for outpatient LTM monitor, cardiology has arranged   B SDH Telephone consult to neurosurgery OK to take ASA Traumatic (mild scalp hematoma noted on CT) - patient now remembers a fall over a coffee table, causing her to hit the back of her head on the floor   HTN Allowed permissive HTN, now out of the window Resume amlodipine    HLD 8/26 lipid panel: 119/77/24/91 LDL is at goal Continue atorvastatin  at current dose   DM 8/26 A1c 7.8, reasonable but not ideal control Resume metformin    Stage 3a CKD Appears to be stable at this time Attempt to avoid nephrotoxic medications Resume Jardiance    Abnormal UA History of large rectovaginal fistula Complains of frequency/urgency but states not acute Culture + for >100k colonies of E coli Likely colonization in the setting of fistula Will not treat at this time (no symptoms during hospitalization)   LBBB (left bundle branch block) New LBBB Troponin negative x 2 Echo with mild LVH of basal-septal segment   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Followed at [facility], from pessary trauma  Colorectal surgical approach to this defect would be either a simple loop colostomy or a proctectomy with end colostomy - but she does not want this She plans to reach out to Dr Sherma team to discuss options for symptoms of uterovaginal prolapse independent of colostomy           Consultants: PT OT   Procedures: Echocardiogram 9/27   Antibiotics: Ceftriaxone  9/26     Pain control - [place]  Controlled Substance Reporting System database was reviewed. and  patient was instructed, not to drive, operate heavy machinery, perform activities at heights, swimming or participation in water activities or provide baby-sitting services while on Pain, Sleep and Anxiety Medications; until their outpatient Physician has advised to do so again. Also  recommended to not to take more than prescribed Pain, Sleep and Anxiety Medications.   Disposition: Skilled nursing facility Diet recommendation:  Carb modified diet DISCHARGE MEDICATION: Allergies as of 11/15/2023       Reactions   Shellfish-derived Products Other (See Comments)   Spiritual belief No Shellfish         Medication List     TAKE these medications    amLODipine  5 MG tablet Commonly known as: NORVASC  TAKE 1 TABLET BY MOUTH ONCE DAILY .   aspirin  EC 325 MG tablet Take 1 tablet (325 mg total) by mouth daily. Start taking on: November 16, 2023   atorvastatin  20 MG tablet Commonly known as: Lipitor Take 1 tablet (20 mg total) by mouth daily.   feeding supplement Liqd Take 237 mLs by mouth 2 (two) times daily between meals.   fluticasone  50 MCG/ACT nasal spray Commonly known as: FLONASE  Place 1 spray into both nostrils 2 (two) times daily as needed for allergies or rhinitis.   FreeStyle Libre 2 Sensor Misc USE TO CHECK BLOOD SUGAR 4 TIMES DAILY   Jardiance  10 MG Tabs tablet Generic drug: empagliflozin  Take 1 tablet (10 mg total) by mouth every morning.   metFORMIN  1000 MG tablet Commonly known as: GLUCOPHAGE  TAKE 1 TABLET BY MOUTH TWICE DAILY WITH  A  MEAL   Systane Balance 0.6 % Soln Generic drug: Propylene Glycol Place 1 drop into both eyes in the morning, at noon, in the evening, and at bedtime.        Contact information for after-discharge care     Destination     [facility] .   Service: Skilled Nursing Contact information: [address] [place]  [address] [phone]                    Discharge Exam:    Subjective: Cognition continues to improve but is not yet back to baseline. Ongoing word finding difficulty is still noted.   Objective: Vitals:   11/14/23 2105 11/15/23 0541  BP: 137/61 (!) 147/63  Pulse: 65 64  Resp: (!) 21 16  Temp: 98.6 F (37 C) 98.4 F (36.9 C)  SpO2: 99% 99%     Intake/Output Summary (Last 24 hours) at 11/15/2023 1322 Last data filed at 11/15/2023 1248 Gross per 24 hour  Intake 240 ml  Output --  Net 240 ml   Filed Weights   11/11/23 2310  Weight: 57.9 kg    Exam:  General:  Appears calm and comfortable and is in NAD Eyes:  normal lids, iris ENT:  grossly normal hearing, lips & tongue, mmm Cardiovascular:  RRR. No LE edema.  Respiratory:   CTA bilaterally with no wheezes/rales/rhonchi.  Normal respiratory effort. Abdomen:  soft, NT, ND Skin:  no rash or induration seen on limited exam Musculoskeletal:  grossly normal tone BUE/BLE, good ROM, no bony abnormality Psychiatric:  grossly normal mood and affect, speech mildly aphasic. No longer confused Neurologic:  CN 2-12 grossly intact, moves all extremities in coordinated fashion  Data Reviewed: I have reviewed the patient's lab results since admission.  Pertinent labs for today include:  Blood cultures NTD Urine culture >100k colonies E coli, resistant to Ampicillin, Augmentin , Cefazolin    Condition at  discharge: stable  The results of significant diagnostics from this hospitalization (including imaging, microbiology, ancillary and laboratory) are listed below for reference.   Microbiology: Results for orders placed or performed during the hospital encounter of 11/11/23  Resp panel by RT-PCR (RSV, Flu A&B, Covid) Anterior Nasal Swab     Status: None   Collection Time: 11/11/23  1:18 PM   Specimen: Anterior Nasal Swab  Result Value Ref Range Status   SARS Coronavirus 2 by RT PCR NEGATIVE NEGATIVE Final    Comment: (NOTE) SARS-CoV-2 target nucleic acids are NOT DETECTED.  The SARS-CoV-2 RNA is generally detectable in upper respiratory specimens during the acute phase of infection. The lowest concentration of SARS-CoV-2 viral copies this assay can detect is 138 copies/mL. A negative result does not preclude SARS-Cov-2 infection and should not be used as the sole basis for  treatment or other patient management decisions. A negative result may occur with  improper specimen collection/handling, submission of specimen other than nasopharyngeal swab, presence of viral mutation(s) within the areas targeted by this assay, and inadequate number of viral copies(<138 copies/mL). A negative result must be combined with clinical observations, patient history, and epidemiological information. The expected result is Negative.  Fact Sheet for Patients:  BloggerCourse.com  Fact Sheet for Healthcare Providers:  SeriousBroker.it  This test is no t yet approved or cleared by the [place]  FDA and  has been authorized for detection and/or diagnosis of SARS-CoV-2 by FDA under an Emergency Use Authorization (EUA). This EUA will remain  in effect (meaning this test can be used) for the duration of the COVID-19 declaration under Section 564(b)(1) of the Act, 21 U.S.C.section 360bbb-3(b)(1), unless the authorization is terminated  or revoked sooner.       Influenza A by PCR NEGATIVE NEGATIVE Final   Influenza B by PCR NEGATIVE NEGATIVE Final    Comment: (NOTE) The Xpert Xpress SARS-CoV-2/FLU/RSV plus assay is intended as an aid in the diagnosis of influenza from Nasopharyngeal swab specimens and should not be used as a sole basis for treatment. Nasal washings and aspirates are unacceptable for Xpert Xpress SARS-CoV-2/FLU/RSV testing.  Fact Sheet for Patients: BloggerCourse.com  Fact Sheet for Healthcare Providers: SeriousBroker.it  This test is not yet approved or cleared by the [place]  FDA and has been authorized for detection and/or diagnosis of SARS-CoV-2 by FDA under an Emergency Use Authorization (EUA). This EUA will remain in effect (meaning this test can be used) for the duration of the COVID-19 declaration under Section 564(b)(1) of the Act, 21  U.S.C. section 360bbb-3(b)(1), unless the authorization is terminated or revoked.     Resp Syncytial Virus by PCR NEGATIVE NEGATIVE Final    Comment: (NOTE) Fact Sheet for Patients: BloggerCourse.com  Fact Sheet for Healthcare Providers: SeriousBroker.it  This test is not yet approved or cleared by the [place]  FDA and has been authorized for detection and/or diagnosis of SARS-CoV-2 by FDA under an Emergency Use Authorization (EUA). This EUA will remain in effect (meaning this test can be used) for the duration of the COVID-19 declaration under Section 564(b)(1) of the Act, 21 U.S.C. section 360bbb-3(b)(1), unless the authorization is terminated or revoked.  Performed at [facility], [address] [place], [place] [address]   Urine Culture     Status: Abnormal   Collection Time: 11/11/23  3:26 PM   Specimen: Urine, Random  Result Value Ref Range Status   Specimen Description   Final    URINE, RANDOM Performed at  [facility], [address] [place], [place] [address]    Special Requests   Final    NONE Reflexed from [id number] Performed at [facility], [address], [place], [place] [address]    Culture >=100,000 COLONIES/mL ESCHERICHIA COLI (A)  Final   Report Status 11/14/2023 FINAL  Final   Organism ID, Bacteria ESCHERICHIA COLI (A)  Final      Susceptibility   Escherichia coli - MIC*    AMPICILLIN >=32 RESISTANT Resistant     CEFAZOLIN (URINE) Value in next row Resistant      >=32 RESISTANTThis is a modified FDA-approved test that has been validated and its performance characteristics determined by the reporting laboratory.  This laboratory is certified under the Clinical Laboratory Improvement Amendments CLIA as qualified to perform high complexity clinical laboratory testing.    CEFEPIME Value in next row Sensitive      >=32 RESISTANTThis is a modified  FDA-approved test that has been validated and its performance characteristics determined by the reporting laboratory.  This laboratory is certified under the Clinical Laboratory Improvement Amendments CLIA as qualified to perform high complexity clinical laboratory testing.    ERTAPENEM Value in next row Sensitive      >=32 RESISTANTThis is a modified FDA-approved test that has been validated and its performance characteristics determined by the reporting laboratory.  This laboratory is certified under the Clinical Laboratory Improvement Amendments CLIA as qualified to perform high complexity clinical laboratory testing.    CEFTRIAXONE  Value in next row Sensitive      >=32 RESISTANTThis is a modified FDA-approved test that has been validated and its performance characteristics determined by the reporting laboratory.  This laboratory is certified under the Clinical Laboratory Improvement Amendments CLIA as qualified to perform high complexity clinical laboratory testing.    CIPROFLOXACIN Value in next row Sensitive      >=32 RESISTANTThis is a modified FDA-approved test that has been validated and its performance characteristics determined by the reporting laboratory.  This laboratory is certified under the Clinical Laboratory Improvement Amendments CLIA as qualified to perform high complexity clinical laboratory testing.    GENTAMICIN Value in next row Sensitive      >=32 RESISTANTThis is a modified FDA-approved test that has been validated and its performance characteristics determined by the reporting laboratory.  This laboratory is certified under the Clinical Laboratory Improvement Amendments CLIA as qualified to perform high complexity clinical laboratory testing.    NITROFURANTOIN Value in next row Sensitive      >=32 RESISTANTThis is a modified FDA-approved test that has been validated and its performance characteristics determined by the reporting laboratory.  This laboratory is certified under the  Clinical Laboratory Improvement Amendments CLIA as qualified to perform high complexity clinical laboratory testing.    TRIMETH/SULFA Value in next row Sensitive      >=32 RESISTANTThis is a modified FDA-approved test that has been validated and its performance characteristics determined by the reporting laboratory.  This laboratory is certified under the Clinical Laboratory Improvement Amendments CLIA as qualified to perform high complexity clinical laboratory testing.    AMPICILLIN/SULBACTAM Value in next row Resistant      >=32 RESISTANTThis is a modified FDA-approved test that has been validated and its performance characteristics determined by the reporting laboratory.  This laboratory is certified under the Clinical Laboratory Improvement Amendments CLIA as qualified to perform high complexity clinical laboratory testing.    PIP/TAZO Value in next row Sensitive  8 SENSITIVEThis is a modified FDA-approved test that has been validated and its performance characteristics determined by the reporting laboratory.  This laboratory is certified under the Clinical Laboratory Improvement Amendments CLIA as qualified to perform high complexity clinical laboratory testing.    MEROPENEM Value in next row Sensitive      8 SENSITIVEThis is a modified FDA-approved test that has been validated and its performance characteristics determined by the reporting laboratory.  This laboratory is certified under the Clinical Laboratory Improvement Amendments CLIA as qualified to perform high complexity clinical laboratory testing.    * >=100,000 COLONIES/mL ESCHERICHIA COLI  Culture, blood (Routine X 2) w Reflex to ID Panel     Status: None (Preliminary result)   Collection Time: 11/12/23 12:10 AM   Specimen: BLOOD LEFT HAND  Result Value Ref Range Status   Specimen Description   Final    BLOOD LEFT HAND Performed at [facility], [address] [place], [place] [address]    Special Requests   Final     BOTTLES DRAWN AEROBIC ONLY Blood Culture adequate volume Performed at [facility], [address], [place], [place] [address]    Culture   Final    NO GROWTH 3 DAYS Performed at [facility], [address], [place], [place] [address]    Report Status PENDING  Incomplete    Labs: CBC: Recent Labs  Lab 11/11/23 1253 11/12/23 0135 11/13/23 0536 11/14/23 0514  WBC 8.5 8.2 7.7 8.2  NEUTROABS 5.2  --  3.4 3.9  HGB 12.3 11.5* 11.5* 11.9*  HCT 39.1 36.8 37.5 37.9  MCV 96.3 96.3 97.2 95.9  PLT 236 241 277 272   Basic Metabolic Panel: Recent Labs  Lab 11/11/23 1253 11/12/23 0135 11/13/23 0536 11/14/23 0514  NA 139 140 139 139  K 3.9 4.1 3.8 4.1  CL 104 105 106 105  CO2 22 24 21* 21*  GLUCOSE 262* 205* 163* 170*  BUN 30* 28* 17 18  CREATININE 1.34* 0.96 0.81 0.87  CALCIUM  9.5 9.1 9.3 9.6  MG 2.1  --   --   --    Liver Function Tests: Recent Labs  Lab 11/11/23 1253  AST 23  ALT 15  ALKPHOS 130*  BILITOT 0.8  PROT 7.3  ALBUMIN 3.7   CBG: Recent Labs  Lab 11/14/23 0727 11/14/23 1139 11/14/23 2210 11/15/23 0741 11/15/23 1203  GLUCAP 188* 196* 301* 193* 191*    Discharge time spent: greater than 30 minutes.  Signed: Delon Herald, MD [facility] 11/15/2023

## 2023-11-15 NOTE — TOC Progression Note (Signed)
 Transition of Care [facility]) - Progression Note    Patient Details  Name: Patricia Mccullough MRN: [id number] Date of Birth: 12-20-1940  Transition of Care [facility]) CM/SW Contact  Heather DELENA Saltness, LCSW Phone Number: 11/15/2023, 1:00 PM  Clinical Narrative:    Pt's insurance authorization for [facility] has been approved. Auth ID: [id number]. CSW notified [place] at [facility].   Expected Discharge Plan: Skilled Nursing Facility Barriers to Discharge: Continued Medical Work up Expected Discharge Plan and Services   Discharge Planning Services: CM Consult   Living arrangements for the past 2 months: Single Family Home                 DME Arranged: N/A DME Agency: NA       HH Arranged: NA HH Agency: NA         Social Drivers of Health (SDOH) Interventions SDOH Screenings   Food Insecurity: No Food Insecurity (11/11/2023)  Housing: Low Risk  (11/11/2023)  Transportation Needs: No Transportation Needs (11/11/2023)  Utilities: Not At Risk (11/11/2023)  Alcohol Screen: Low Risk  (08/08/2023)  Depression (PHQ2-9): Medium Risk (10/11/2023)  Financial Resource Strain: Low Risk  (08/08/2023)  Physical Activity: Insufficiently Active (08/08/2023)  Social Connections: Moderately Integrated (11/11/2023)  Stress: No Stress Concern Present (08/08/2023)  Tobacco Use: Low Risk  (11/11/2023)  Health Literacy: Adequate Health Literacy (08/08/2023)    Readmission Risk Interventions     No data to display          Signed: Heather Saltness, MSW, LCSW Clinical Social Worker Inpatient Care Management 11/15/2023 1:01 PM

## 2023-11-16 ENCOUNTER — Encounter: Payer: Self-pay | Admitting: *Deleted

## 2023-11-17 DIAGNOSIS — I152 Hypertension secondary to endocrine disorders: Secondary | ICD-10-CM | POA: Diagnosis not present

## 2023-11-17 DIAGNOSIS — I639 Cerebral infarction, unspecified: Secondary | ICD-10-CM | POA: Diagnosis not present

## 2023-11-17 DIAGNOSIS — E785 Hyperlipidemia, unspecified: Secondary | ICD-10-CM | POA: Diagnosis not present

## 2023-11-17 DIAGNOSIS — E1159 Type 2 diabetes mellitus with other circulatory complications: Secondary | ICD-10-CM | POA: Diagnosis not present

## 2023-11-17 DIAGNOSIS — R4701 Aphasia: Secondary | ICD-10-CM | POA: Diagnosis not present

## 2023-11-17 DIAGNOSIS — M6281 Muscle weakness (generalized): Secondary | ICD-10-CM | POA: Diagnosis not present

## 2023-11-17 LAB — CULTURE, BLOOD (ROUTINE X 2)
Culture: NO GROWTH
Special Requests: ADEQUATE

## 2023-11-18 LAB — CULTURE, BLOOD (ROUTINE X 2)
Culture: NO GROWTH
Special Requests: ADEQUATE

## 2023-11-23 DIAGNOSIS — I639 Cerebral infarction, unspecified: Secondary | ICD-10-CM | POA: Diagnosis not present

## 2023-11-24 DIAGNOSIS — L500 Allergic urticaria: Secondary | ICD-10-CM | POA: Diagnosis not present

## 2023-11-24 DIAGNOSIS — S065X0A Traumatic subdural hemorrhage without loss of consciousness, initial encounter: Secondary | ICD-10-CM | POA: Diagnosis not present

## 2023-11-24 DIAGNOSIS — G9340 Encephalopathy, unspecified: Secondary | ICD-10-CM | POA: Diagnosis not present

## 2023-11-24 DIAGNOSIS — E785 Hyperlipidemia, unspecified: Secondary | ICD-10-CM | POA: Diagnosis not present

## 2023-11-25 DIAGNOSIS — I10 Essential (primary) hypertension: Secondary | ICD-10-CM | POA: Diagnosis not present

## 2023-11-25 DIAGNOSIS — E119 Type 2 diabetes mellitus without complications: Secondary | ICD-10-CM | POA: Diagnosis not present

## 2023-11-25 DIAGNOSIS — E7800 Pure hypercholesterolemia, unspecified: Secondary | ICD-10-CM | POA: Diagnosis not present

## 2023-11-28 ENCOUNTER — Telehealth: Payer: Self-pay

## 2023-11-28 NOTE — Transitions of Care (Post Inpatient/ED Visit) (Signed)
 11/28/2023  Name: Patricia Mccullough MRN: [id number] DOB: [birth year]  Today's TOC FU Call Status: Today's TOC FU Call Status:: Successful TOC FU Call Completed Unsuccessful Call (1st Attempt) Date: 11/28/23 [facility] FU Call Complete Date: 11/28/23 Patient's Name and Date of Birth confirmed.  Transition Care Management Follow-up Telephone Call Date of Discharge: 11/26/23 Discharge Facility: Other [facility]) Name of Other ([facility]) Discharge Facility: [facility] Type of Discharge: Inpatient Admission Primary Inpatient Discharge Diagnosis:: UTI How have you been since you were released from the hospital?: Better Any questions or concerns?: No  Items Reviewed: Did you receive and understand the discharge instructions provided?: Yes Medications obtained,verified, and reconciled?: Yes (Medications Reviewed) Any new allergies since your discharge?: No Dietary orders reviewed?: Yes Do you have support at home?: Yes People in Home [RPT]: child(ren), adult  Medications Reviewed Today: Medications Reviewed Today     Reviewed by Emmitt Pan, LPN (Licensed Practical Nurse) on 11/28/23 at 1111  Med List Status: <None>   Medication Order Taking? Sig Documenting Provider Last Dose Status Informant  amLODipine  (NORVASC ) 5 MG tablet [id number] Yes TAKE 1 TABLET BY MOUTH ONCE DAILY . Geofm Glade PARAS, MD  Active Self  aspirin  EC 325 MG tablet [id number] Yes Take 1 tablet (325 mg total) by mouth daily. Barbarann Nest, MD  Active   atorvastatin  (LIPITOR) 20 MG tablet [id number] Yes Take 1 tablet (20 mg total) by mouth daily. Geofm Glade PARAS, MD  Active Self  Continuous Glucose Sensor (FREESTYLE LIBRE 2 SENSOR) [place] [id number] Yes USE TO CHECK BLOOD SUGAR 4 TIMES DAILY Burns, Glade PARAS, MD  Active Self  feeding supplement (ENSURE PLUS HIGH PROTEIN) LIQD [id number] Yes Take 237 mLs by mouth 2 (two) times daily between meals. Barbarann Nest, MD  Active   fluticasone  (FLONASE ) 50 MCG/ACT nasal  spray [id number] Yes Place 1 spray into both nostrils 2 (two) times daily as needed for allergies or rhinitis. [provider]  Active Self  JARDIANCE  10 MG TABS tablet [id number] Yes Take 1 tablet (10 mg total) by mouth every morning. Geofm Glade PARAS, MD  Active Self  metFORMIN  (GLUCOPHAGE ) 1000 MG tablet [id number] Yes TAKE 1 TABLET BY MOUTH TWICE DAILY WITH  A  MEAL Burns, Glade PARAS, MD  Active Self  SYSTANE BALANCE 0.6 % SOLN [id number] Yes Place 1 drop into both eyes in the morning, at noon, in the evening, and at bedtime. [provider]  Active Self            Home Care and Equipment/Supplies: Were Home Health Services Ordered?: Yes Name of Home Health Agency:: unknown Has Agency set up a time to come to your home?: Yes First Home Health Visit Date: 11/29/23 Any new equipment or medical supplies ordered?: Yes Name of Medical supply agency?: unknown Were you able to get the equipment/medical supplies?: Yes Do you have any questions related to the use of the equipment/supplies?: No  Functional Questionnaire: Do you need assistance with bathing/showering or dressing?: Yes Do you need assistance with meal preparation?: Yes Do you need assistance with eating?: No Do you have difficulty maintaining continence: No Do you need assistance with getting out of bed/getting out of a chair/moving?: No Do you have difficulty managing or taking your medications?: No  Follow up appointments reviewed: PCP Follow-up appointment confirmed?: Yes Date of PCP follow-up appointment?: 12/01/23 [facility] Follow-up appointment confirmed?: NA Do you need transportation to your follow-up appointment?: No Do you understand care options if your condition(s) worsen?:  Yes-patient verbalized understanding    SIGNATURE Julian Lemmings, LPN [facility] Nurse Health Advisor Direct Dial [phone]

## 2023-11-28 NOTE — Transitions of Care (Post Inpatient/ED Visit) (Signed)
   11/28/2023  Name: Patricia Mccullough MRN: [id number] DOB: 05-11-1940  Today's TOC FU Call Status: Today's TOC FU Call Status:: Unsuccessful Call (1st Attempt) Unsuccessful Call (1st Attempt) Date: 11/28/23  Attempted to reach the patient regarding the most recent Inpatient/ED visit.  Follow Up Plan: Additional outreach attempts will be made to reach the patient to complete the Transitions of Care (Post Inpatient/ED visit) call.   Signature Julian Lemmings, LPN [facility] Nurse Health Advisor Direct Dial [phone]

## 2023-11-30 ENCOUNTER — Encounter: Payer: Self-pay | Admitting: Internal Medicine

## 2023-11-30 NOTE — Progress Notes (Unsigned)
 Subjective:    Patient ID: Patricia Mccullough, female    DOB: [birth year], 83 y.o.   MRN: [id number]     HPI Ezmeralda is here for follow up hospital / rehab.  She is here with her daughter.  Discharged from rehab - [facility] 11/26/23  Hospitalized 9/26 - 9/30  She presented with AMS, speech difficulty.  She was not able to provide the history - per her daughter she was normal Monday, Tuesday she did repeat herself a lot, but still sounded normal.  On Wednesday she was repeating the same thing - stating she needed to go to the back.  She was also agitated.  EMS was called and she refused to go to the ED, but her daughter insisted and she did go.  She had increase urinary urgency and frequency but did not think it was acute.  She did not feel like she was emptying her bladder.  She was not eating or drinking well.  Other ROS was negative.    VS   BP 93/48   UA consistent with uti.  CXR neg.  CT head w/o acute change.  Given rocephin  x 1, IVF.    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  Echo preserved EF, grade 1 DD.  Neurosurgery approved ASA monotherapy.  Neurology cs.   PT/OT.ST.  cardio recommended outpatient LTM  B SDH - neurosugery consulted.  Ok for ASA.   Traumatic - mild scalp hematoma noted on CT - patient later remembered falling  Htn - allowed permissive htn, amlodipine  resumed  HLD - LDL at goal.  Atorvastatin  continued  DM - A1c 7.8 - not ideally controlled.  Resumed metformin   CKD stage 3a - stable.  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 She did not want surgery. She will f/u with them.    Thought pattern per patient  is not good.   Not eating regularly.    BP 105/82 at home and she has been experiencing dizziness and they were not sure if the dizziness was related to her blood pressure being low.  She did not take the amlodipine  this morning.  Dizziness - fell yesterday - spinning - worse with changes positions.  Not with walking.  Balance is not good.   Has [facility] PT, OT, nursing and aide-she does have physical therapy and occupational therapy scheduled   Cognitive -there is still some confusion and memory issues and her daughter is concerned about her memory.  She is also concerned about her ability to take care of herself and her brother for whom she is the primary caregiver.  Medications and allergies reviewed with patient and updated if appropriate.  Current Outpatient Medications on File Prior to Visit  Medication Sig Dispense Refill   amLODipine  (NORVASC ) 5 MG tablet TAKE 1 TABLET BY MOUTH ONCE DAILY . 90 tablet 2   aspirin  EC 325 MG tablet Take 1 tablet (325 mg total) by mouth daily.     atorvastatin  (LIPITOR) 20 MG tablet Take 1 tablet (20 mg total) by mouth daily. 90 tablet 2   Continuous Glucose Sensor (FREESTYLE LIBRE 2 SENSOR) MISC USE  TO CHECK BLOOD SUGAR 4 TIMES DAILY 2 each 5   feeding supplement (ENSURE PLUS HIGH PROTEIN) LIQD Take 237 mLs by mouth 2 (two) times daily between meals.     fluticasone  (FLONASE ) 50 MCG/ACT nasal spray Place 1 spray into both nostrils 2 (two) times daily as needed for allergies or rhinitis.     JARDIANCE  10 MG TABS tablet Take 1 tablet (10 mg total) by mouth every morning. 90 tablet 2   metFORMIN  (GLUCOPHAGE ) 1000 MG tablet TAKE 1 TABLET BY MOUTH TWICE DAILY WITH  A  MEAL 180 tablet 0   SYSTANE BALANCE 0.6 % SOLN Place 1 drop into both eyes in the morning, at noon, in the evening, and at bedtime.     No current facility-administered medications on file prior to visit.     Review of Systems  Constitutional:  Negative for fever.  HENT:  Positive for  voice change (hoarseness).   Respiratory:  Positive for cough and shortness of breath. Negative for wheezing.   Cardiovascular:  Negative for chest pain and leg swelling.  Gastrointestinal:  Positive for constipation.       No gerd  Neurological:  Positive for dizziness and headaches (mild discomfort).  Psychiatric/Behavioral:  The patient is nervous/anxious.        Objective:   Vitals:   12/01/23 1123  BP: 124/74  Pulse: 78  Temp: 98 F (36.7 C)  SpO2: 95%   BP Readings from Last 3 Encounters:  12/01/23 124/74  11/15/23 (!) 147/63  10/11/23 124/70   Wt Readings from Last 3 Encounters:  12/01/23 132 lb (59.9 kg)  11/11/23 127 lb 10.3 oz (57.9 kg)  10/11/23 137 lb (62.1 kg)   Body mass index is 24.94 kg/m.    Physical Exam Constitutional:      General: She is not in acute distress.    Appearance: Normal appearance.  HENT:     Head: Normocephalic and atraumatic.  Eyes:     Conjunctiva/sclera: Conjunctivae normal.  Cardiovascular:     Rate and Rhythm: Normal rate and regular rhythm.     Heart sounds: Normal heart sounds.  Pulmonary:     Effort: Pulmonary effort is normal. No respiratory distress.     Breath sounds: Normal breath sounds. No wheezing.  Musculoskeletal:     Cervical back: Neck supple.     Right lower leg: No edema.     Left lower leg: No edema.  Lymphadenopathy:     Cervical: No cervical adenopathy.  Skin:    General: Skin is warm and dry.     Findings: No rash.  Neurological:     Mental Status: She is alert.     Sensory: No sensory deficit.     Motor: No weakness.  Psychiatric:        Mood and Affect: Mood normal.        Behavior: Behavior normal.        Lab Results  Component Value Date   WBC 8.2 11/14/2023   HGB 11.9 (L) 11/14/2023   HCT 37.9 11/14/2023   PLT 272 11/14/2023   GLUCOSE 170 (H) 11/14/2023   CHOL 119 10/11/2023   TRIG 91.0 10/11/2023   HDL 77.00 10/11/2023   LDLDIRECT 43.0 10/06/2021   LDLCALC 24 10/11/2023    ALT 15 11/11/2023   AST 23 11/11/2023   NA 139 11/14/2023   K 4.1 11/14/2023   CL 105 11/14/2023   CREATININE 0.87 11/14/2023   BUN 18 11/14/2023   CO2  21 (L) 11/14/2023   TSH 0.614 11/11/2023   INR 1.1 11/11/2023   HGBA1C 7.8 (H) 10/11/2023   MICROALBUR 2.5 (H) 10/11/2023     Assessment & Plan:    See Problem List for Assessment and Plan of chronic medical problems.

## 2023-12-01 ENCOUNTER — Ambulatory Visit: Admitting: Internal Medicine

## 2023-12-01 VITALS — BP 124/74 | HR 78 | Temp 98.0°F | Ht 61.0 in | Wt 132.0 lb

## 2023-12-01 DIAGNOSIS — Z7984 Long term (current) use of oral hypoglycemic drugs: Secondary | ICD-10-CM

## 2023-12-01 DIAGNOSIS — E785 Hyperlipidemia, unspecified: Secondary | ICD-10-CM | POA: Diagnosis not present

## 2023-12-01 DIAGNOSIS — E1169 Type 2 diabetes mellitus with other specified complication: Secondary | ICD-10-CM | POA: Diagnosis not present

## 2023-12-01 DIAGNOSIS — R413 Other amnesia: Secondary | ICD-10-CM | POA: Diagnosis not present

## 2023-12-01 DIAGNOSIS — I152 Hypertension secondary to endocrine disorders: Secondary | ICD-10-CM | POA: Diagnosis not present

## 2023-12-01 DIAGNOSIS — N823 Fistula of vagina to large intestine: Secondary | ICD-10-CM | POA: Diagnosis not present

## 2023-12-01 DIAGNOSIS — Z8673 Personal history of transient ischemic attack (TIA), and cerebral infarction without residual deficits: Secondary | ICD-10-CM

## 2023-12-01 DIAGNOSIS — E1159 Type 2 diabetes mellitus with other circulatory complications: Secondary | ICD-10-CM | POA: Diagnosis not present

## 2023-12-01 DIAGNOSIS — R42 Dizziness and giddiness: Secondary | ICD-10-CM | POA: Insufficient documentation

## 2023-12-01 DIAGNOSIS — G9340 Encephalopathy, unspecified: Secondary | ICD-10-CM

## 2023-12-01 MED ORDER — AMLODIPINE BESYLATE 2.5 MG PO TABS: 2.5000 mg | ORAL_TABLET | Freq: Every day | ORAL | 1 refills | Status: AC

## 2023-12-01 NOTE — Assessment & Plan Note (Addendum)
 Recent CVA, remote CVAs MRI 11/12/2023: Acute cortical and subjacent white matter infarct in the anterior right parietal lobe.  Remote lacunar infarcts in the bilateral thalami and left cerebellum  Continue aspirin  325 mg daily, atorvastatin  20 mg daily Blood pressure controlled-Will decrease dose of amlodipine  since BP has been low at home Referral to neurology Has cardiology appointment-did have a monitor sent home with her from the hospital, but it did not pick and she will bring to cardiology and discuss further evaluation with them

## 2023-12-01 NOTE — Assessment & Plan Note (Signed)
 Resolved Presented to the ED with acute confusion and speech difficulty with poor p.o. intake Infection ruled out Found to have had an acute stroke Still has some residual confusion/memory issues we will

## 2023-12-01 NOTE — Assessment & Plan Note (Signed)
 New She has been experiencing dizziness-spinning sensation at home ?  Related to hypotension Her dizziness does seem to be related to changes in position may be BPPV Dizziness may also be secondary to stroke Stay hydrated Will adjust blood pressure medication Referral to neurology

## 2023-12-01 NOTE — Patient Instructions (Addendum)
   Top BP number  > 110 and < 140   Medications changes include :   Decrease amlodipine  to 2.5 mg daily  Monitor BP at home    A referral was ordered [facility] and someone will call you to schedule an appointment.     Return for follow up as scheduled.

## 2023-12-01 NOTE — Assessment & Plan Note (Addendum)
 Chronic Blood pressure too low on 5 mg of amlodipine  and she has been experiencing some dizziness which may or may not be related to the low blood pressure Decrease amlodipine  to 2.5 mg daily Monitor BP closely

## 2023-12-01 NOTE — Assessment & Plan Note (Addendum)
 Confusion and metabolic encephalopathy or presenting symptoms with recent stroke She is having some memory issues which is likely acute on chronic Referral to neurology for further evaluation  Her daughter is here, but does live in [place]  and will not be able to stay with her long-term.  She is concerned about her ability to take care of herself and her son who she is the primary caregiver She does have home health coming and advised to ask them if they can get her a Child psychotherapist to look into community resources, etc.-if they are not able to do that then I will order a referral for social work

## 2023-12-01 NOTE — Assessment & Plan Note (Signed)
 Chronic Associated with vascular disease-history of CVA, hypertension, hyperlipidemia Lab Results  Component Value Date   HGBA1C 7.8 (H) 10/11/2023   Sugars not ideally controlled, but okay for age Her daughter is with her now and she is eating better which will likely help She has been checking sugars at home and they have been controlled No change in medication Recently has not been eating as good because of increased stress Continue Jardiance  10 mg daily, metformin  1000 mg twice daily Stressed regular exercise, diabetic diet

## 2023-12-01 NOTE — Assessment & Plan Note (Signed)
 Chronic Regular exercise and healthy diet encouraged Lab Results  Component Value Date   LDLCALC 24 10/11/2023  LDL at goal Continue atorvastatin  20 mg daily

## 2023-12-01 NOTE — Assessment & Plan Note (Signed)
 Chronic Following at [facility] not wanted surgery because of the possibility of colostomy

## 2023-12-02 ENCOUNTER — Telehealth: Payer: Self-pay

## 2023-12-02 NOTE — Telephone Encounter (Signed)
 Okay for orders?

## 2023-12-02 NOTE — Telephone Encounter (Signed)
 Copied from CRM [id number]. Topic: Clinical - Home Health Verbal Orders >> Dec 01, 2023  2:49 PM Franky GRADE wrote: Caller/Agency: [facility]  Callback Number: [phone] Service Requested: Home Health Aid  Frequency: 5 days out of the week.  Any new concerns about the patient? No

## 2023-12-06 NOTE — Telephone Encounter (Signed)
Verbals given  

## 2023-12-14 DIAGNOSIS — E1169 Type 2 diabetes mellitus with other specified complication: Secondary | ICD-10-CM

## 2023-12-14 DIAGNOSIS — E1159 Type 2 diabetes mellitus with other circulatory complications: Secondary | ICD-10-CM | POA: Diagnosis not present

## 2023-12-14 DIAGNOSIS — S065X0D Traumatic subdural hemorrhage without loss of consciousness, subsequent encounter: Secondary | ICD-10-CM | POA: Diagnosis not present

## 2023-12-14 DIAGNOSIS — B9620 Unspecified Escherichia coli [E. coli] as the cause of diseases classified elsewhere: Secondary | ICD-10-CM

## 2023-12-14 DIAGNOSIS — E785 Hyperlipidemia, unspecified: Secondary | ICD-10-CM

## 2023-12-14 DIAGNOSIS — N823 Fistula of vagina to large intestine: Secondary | ICD-10-CM

## 2023-12-14 DIAGNOSIS — E1122 Type 2 diabetes mellitus with diabetic chronic kidney disease: Secondary | ICD-10-CM | POA: Diagnosis not present

## 2023-12-14 DIAGNOSIS — G9340 Encephalopathy, unspecified: Secondary | ICD-10-CM

## 2023-12-14 DIAGNOSIS — N390 Urinary tract infection, site not specified: Secondary | ICD-10-CM

## 2023-12-14 DIAGNOSIS — N1831 Chronic kidney disease, stage 3a: Secondary | ICD-10-CM

## 2023-12-14 DIAGNOSIS — I447 Left bundle-branch block, unspecified: Secondary | ICD-10-CM

## 2023-12-14 DIAGNOSIS — I152 Hypertension secondary to endocrine disorders: Secondary | ICD-10-CM | POA: Diagnosis not present

## 2023-12-28 NOTE — Telephone Encounter (Unsigned)
 Copied from CRM [id number]. Topic: Clinical - Medical Advice >> Dec 28, 2023  1:54 PM Eva FALCON wrote: Reason for CRM: Pt Daughter Elouise was calling in and states she was advised to let them know if [facility] is unable to provide home health aid. She said if they are unable to Dr. Geofm could send some type of order elsewhere. Please reach out to daughter [phone].

## 2023-12-29 ENCOUNTER — Other Ambulatory Visit: Payer: Self-pay | Admitting: Internal Medicine

## 2023-12-29 ENCOUNTER — Ambulatory Visit: Attending: Physician Assistant

## 2023-12-29 ENCOUNTER — Ambulatory Visit: Payer: Self-pay | Admitting: Physician Assistant

## 2023-12-29 DIAGNOSIS — E113599 Type 2 diabetes mellitus with proliferative diabetic retinopathy without macular edema, unspecified eye: Secondary | ICD-10-CM

## 2023-12-29 DIAGNOSIS — I639 Cerebral infarction, unspecified: Secondary | ICD-10-CM | POA: Diagnosis not present

## 2023-12-29 NOTE — Progress Notes (Deleted)
  Cardiology Office Note:  .   Date:  12/29/2023  ID:  Patricia Mccullough, DOB [birth year], MRN [id number] PCP: Geofm Glade PARAS, MD  [facility] Providers Cardiologist:  None { Click to update primary MD,subspecialty MD or APP then REFRESH:1}   History of Present Illness: .   No chief complaint on file.   Patricia Mccullough is a 83 y.o. female with history of CVA, HTN, HLD who presents for the evaluation of CVA at the request of Burns, Stacy J, MD.     Problem List CVA -R parietal lobe infarct 11/12/2023 DM -A1c 7.8 HTN HLD -T chol 119, HDL 77, LDL 24, TG 91    ROS: All other ROS reviewed and negative. Pertinent positives noted in the HPI.     Studies Reviewed: SABRA       TTE 11/12/2023  1. Left ventricular ejection fraction, by estimation, is 60 to 65%. The  left ventricle has normal function. The left ventricle has no regional  wall motion abnormalities. There is mild left ventricular hypertrophy of  the basal-septal segment. Left  ventricular diastolic parameters are consistent with Grade I diastolic  dysfunction (impaired relaxation).   2. Right ventricular systolic function is normal. The right ventricular  size is normal. Tricuspid regurgitation signal is inadequate for assessing  PA pressure.   3. The mitral valve is normal in structure. No evidence of mitral valve  regurgitation. No evidence of mitral stenosis.   4. The aortic valve is tricuspid. Aortic valve regurgitation is not  visualized. Aortic valve sclerosis/calcification is present, without any  evidence of aortic stenosis.   5. The inferior vena cava is normal in size with greater than 50%  respiratory variability, suggesting right atrial pressure of 3 mmHg.  Physical Exam:   VS:  There were no vitals taken for this visit.   Wt Readings from Last 3 Encounters:  12/01/23 132 lb (59.9 kg)  11/11/23 127 lb 10.3 oz (57.9 kg)  10/11/23 137 lb (62.1 kg)    GEN: Well nourished, well developed in no  acute distress NECK: No JVD; No carotid bruits CARDIAC: ***RRR, no murmurs, rubs, gallops RESPIRATORY:  Clear to auscultation without rales, wheezing or rhonchi  ABDOMEN: Soft, non-tender, non-distended EXTREMITIES:  No edema; No deformity  ASSESSMENT AND PLAN: .   ***    {Are you ordering a CV Procedure (e.g. stress test, cath, DCCV, TEE, etc)?   Press F2        :[id number]}   Follow-up: No follow-ups on file.  Signed, Darryle DASEN. Barbaraann, MD, [facility]  [facility]  [address] [place], [place] [address] [phone]  3:14 PM

## 2023-12-30 ENCOUNTER — Ambulatory Visit: Attending: Cardiovascular Disease | Admitting: Cardiovascular Disease

## 2023-12-30 DIAGNOSIS — E782 Mixed hyperlipidemia: Secondary | ICD-10-CM

## 2023-12-30 DIAGNOSIS — I639 Cerebral infarction, unspecified: Secondary | ICD-10-CM

## 2023-12-30 DIAGNOSIS — I150 Renovascular hypertension: Secondary | ICD-10-CM

## 2024-01-02 ENCOUNTER — Encounter: Payer: Self-pay | Admitting: Cardiovascular Disease

## 2024-01-19 ENCOUNTER — Other Ambulatory Visit: Payer: Self-pay | Admitting: Internal Medicine

## 2024-01-27 ENCOUNTER — Other Ambulatory Visit: Payer: Self-pay | Admitting: Internal Medicine

## 2024-01-27 NOTE — Telephone Encounter (Unsigned)
 Copied from CRM #[id number]. Topic: Clinical - Medication Refill >> Jan 27, 2024  1:18 PM Jayma L wrote: Medication: JARDIANCE  10 MG TABS tablet   Has the patient contacted their pharmacy? Yes (Agent: If no, request that the patient contact the pharmacy for the refill. If patient does not wish to contact the pharmacy document the reason why and proceed with request.) (Agent: If yes, when and what did the pharmacy advise?)  This is the patient's preferred pharmacy:  [facility] [address] ([address] [place] - [address] [place] ([place]) [place] [address] Phone: [phone] Fax: [phone]  Is this the correct pharmacy for this prescription? Yes If no, delete pharmacy and type the correct one.   Has the prescription been filled recently? No  Is the patient out of the medication? Yes  Has the patient been seen for an appointment in the last year OR does the patient have an upcoming appointment? Yes  Can we respond through MyChart? No  Agent: Please be advised that Rx refills may take up to 3 business days. We ask that you follow-up with your pharmacy.

## 2024-01-31 ENCOUNTER — Other Ambulatory Visit: Payer: Self-pay | Admitting: Internal Medicine

## 2024-01-31 DIAGNOSIS — E113599 Type 2 diabetes mellitus with proliferative diabetic retinopathy without macular edema, unspecified eye: Secondary | ICD-10-CM

## 2024-02-23 ENCOUNTER — Other Ambulatory Visit: Payer: Self-pay | Admitting: Internal Medicine

## 2024-02-25 ENCOUNTER — Other Ambulatory Visit: Payer: Self-pay

## 2024-02-25 ENCOUNTER — Encounter (HOSPITAL_BASED_OUTPATIENT_CLINIC_OR_DEPARTMENT_OTHER): Payer: Self-pay

## 2024-02-25 ENCOUNTER — Emergency Department (HOSPITAL_BASED_OUTPATIENT_CLINIC_OR_DEPARTMENT_OTHER)
Admission: EM | Admit: 2024-02-25 | Discharge: 2024-02-25 | Disposition: A | Discharge status: Home / Self Care | Attending: Emergency Medicine | Admitting: Emergency Medicine

## 2024-02-25 ENCOUNTER — Emergency Department (HOSPITAL_BASED_OUTPATIENT_CLINIC_OR_DEPARTMENT_OTHER)

## 2024-02-25 DIAGNOSIS — Z7984 Long term (current) use of oral hypoglycemic drugs: Secondary | ICD-10-CM | POA: Diagnosis not present

## 2024-02-25 DIAGNOSIS — W01198A Fall on same level from slipping, tripping and stumbling with subsequent striking against other object, initial encounter: Secondary | ICD-10-CM | POA: Insufficient documentation

## 2024-02-25 DIAGNOSIS — Z8673 Personal history of transient ischemic attack (TIA), and cerebral infarction without residual deficits: Secondary | ICD-10-CM | POA: Diagnosis not present

## 2024-02-25 DIAGNOSIS — E119 Type 2 diabetes mellitus without complications: Secondary | ICD-10-CM | POA: Insufficient documentation

## 2024-02-25 DIAGNOSIS — Z7982 Long term (current) use of aspirin: Secondary | ICD-10-CM | POA: Diagnosis not present

## 2024-02-25 DIAGNOSIS — I10 Essential (primary) hypertension: Secondary | ICD-10-CM | POA: Diagnosis not present

## 2024-02-25 DIAGNOSIS — R519 Headache, unspecified: Secondary | ICD-10-CM | POA: Insufficient documentation

## 2024-02-25 DIAGNOSIS — Z79899 Other long term (current) drug therapy: Secondary | ICD-10-CM | POA: Diagnosis not present

## 2024-02-25 DIAGNOSIS — W19XXXA Unspecified fall, initial encounter: Secondary | ICD-10-CM

## 2024-02-25 NOTE — ED Notes (Signed)
 No acute distress or obvious injury. Pt ambulatory without assistance using her cane, at baseline. Wants evaluation for her mechanical fall to make sure nothing is secretly wrong.

## 2024-02-25 NOTE — ED Provider Notes (Signed)
 " [place] [facility] Provider Note   CSN: [id number] Arrival date & time: 02/25/24  1617     Patient presents with: Patricia Mccullough is a 84 y.o. female.  Patient with past history significant for hypertension, prior CVA, type 2 diabetes who presents to the emergency department with concerns of a fall.  Reports that she was at home when she slipped on dry capital on the ground, fell backwards landing on the ground and striking her head against a bookshelf.  She denies loss of consciousness.  Denies any lacerations or abrasions.  She is on aspirin  denies any blood thinner use.  She wanted to come in for evaluation due to initially feeling somewhat queasy earlier but denies any vomiting.  Difficult to determine the exact time that this fall occurred but based on family member at bedside, appears that this was likely around the 6-7 o'clock hour this morning, 11 hours ago.   Fall       Prior to Admission medications  Medication Sig Start Date End Date Taking? Authorizing Provider  amLODipine  (NORVASC ) 2.5 MG tablet Take 1 tablet (2.5 mg total) by mouth daily. 12/01/23   Geofm Glade PARAS, MD  aspirin  EC 325 MG tablet Take 1 tablet (325 mg total) by mouth daily. 11/16/23   Barbarann Nest, MD  atorvastatin  (LIPITOR) 20 MG tablet Take 1 tablet (20 mg total) by mouth daily. 04/12/23 04/11/24  Geofm Glade PARAS, MD  Continuous Glucose Sensor (FREESTYLE LIBRE 2 SENSOR) MISC USE TO CHECK BLOOD SUGAR 4 TIMES DAILY 01/31/24   Geofm Glade PARAS, MD  feeding supplement (ENSURE PLUS HIGH PROTEIN) LIQD Take 237 mLs by mouth 2 (two) times daily between meals. 11/15/23   Barbarann Nest, MD  fluticasone  (FLONASE ) 50 MCG/ACT nasal spray Place 1 spray into both nostrils 2 (two) times daily as needed for allergies or rhinitis.    [provider]  JARDIANCE  10 MG TABS tablet TAKE 1 TABLET BY MOUTH ONCE DAILY IN THE MORNING 01/19/24   Burns, Glade PARAS, MD  metFORMIN   (GLUCOPHAGE ) 1000 MG tablet TAKE 1 TABLET BY MOUTH TWICE DAILY WITH A MEAL 02/24/24   Burns, Glade PARAS, MD  SYSTANE BALANCE 0.6 % SOLN Place 1 drop into both eyes in the morning, at noon, in the evening, and at bedtime. 07/15/21   [provider]    Allergies: Shellfish protein-containing drug products    Review of Systems  Constitutional:        Head injury  All other systems reviewed and are negative.   Updated Vital Signs BP 126/61 (BP Location: Right Arm)   Pulse 78   Temp 98.9 F (37.2 C)   Resp 16   Ht 5' 1 (1.549 m)   Wt 59.9 kg   SpO2 98%   BMI 24.94 kg/m   Physical Exam Vitals and nursing note reviewed.  Constitutional:      General: She is not in acute distress.    Appearance: She is well-developed.  HENT:     Head: Normocephalic and atraumatic.     Comments: TTP along the left occipital scalp with no lacerations or abrasions seen. Eyes:     Conjunctiva/sclera: Conjunctivae normal.  Cardiovascular:     Rate and Rhythm: Normal rate and regular rhythm.     Heart sounds: No murmur heard. Pulmonary:     Effort: Pulmonary effort is normal. No respiratory distress.     Breath sounds: Normal breath sounds. No  wheezing, rhonchi or rales.  Abdominal:     Palpations: Abdomen is soft.     Tenderness: There is no abdominal tenderness.  Musculoskeletal:        General: No swelling.     Cervical back: Neck supple.  Skin:    General: Skin is warm and dry.     Capillary Refill: Capillary refill takes less than 2 seconds.  Neurological:     Mental Status: She is alert.  Psychiatric:        Mood and Affect: Mood normal.     (all labs ordered are listed, but only abnormal results are displayed) Labs Reviewed - No data to display  EKG: None  Radiology: CT Cervical Spine Wo Contrast Result Date: 02/25/2024 EXAM: CT CERVICAL SPINE WITHOUT CONTRAST 02/25/2024 06:25:00 PM TECHNIQUE: CT of the cervical spine was performed without the administration of intravenous  contrast. Multiplanar reformatted images are provided for review. Automated exposure control, iterative reconstruction, and/or weight based adjustment of the mA/kV was utilized to reduce the radiation dose to as low as reasonably achievable. COMPARISON: CTA head and neck 11/13/2023. CLINICAL HISTORY: Neck trauma. Fall. FINDINGS: BONES AND ALIGNMENT: Chronic straightening of the normal lordosis in the lower cervical spine. Unchanged trace anterolisthesis of C4 on C5 and C5 on C6. Mild left convex curvature of the cervical spine. No acute fracture or suspicious lesion. DEGENERATIVE CHANGES: Advanced disc space narrowing at C6-C7 with no more than mild narrowing elsewhere. Asymmetrically advanced right sided facet arthrosis throughout most of the cervical spine. No evidence of high grade spinal canal stenosis. SOFT TISSUES: No prevertebral soft tissue swelling. Chronic, partially peripherally calcified right submandibular mass, present on studies dating back to 2010 and considered benign. 1.4 cm mildly hyperdense left thyroid  nodule for which no follow up imaging is recommended. IMPRESSION: 1. No acute cervical spine fracture or traumatic malalignment. Electronically signed by: Dasie Hamburg MD MD 02/25/2024 06:57 PM EST RP Workstation: [id number]   CT Head Wo Contrast Result Date: 02/25/2024 EXAM: CT HEAD WITHOUT CONTRAST 02/25/2024 06:25:00 PM TECHNIQUE: CT of the head was performed without the administration of intravenous contrast. Automated exposure control, iterative reconstruction, and/or weight based adjustment of the mA/kV was utilized to reduce the radiation dose to as low as reasonably achievable. COMPARISON: CTA head and neck 11/13/2023. MRI head 11/12/2023. CLINICAL HISTORY: Head trauma, moderate-severe. FINDINGS: BRAIN AND VENTRICLES: No acute infarct, intracranial hemorrhage, mass, midline shift, or extra-axial fluid collection is identified. Ventriculomegaly is unchanged and is again noted to be out of  proportion to the cerebral sulci which are nonenlarged, and this may reflect central predominant cerebral atrophy or normal pressure hydrocephalus in the appropriate clinical setting. A small chronic right parietal infarct was acute on the prior MRI. Patchy to confluent hypodensities in the cerebral white matter bilaterally are similar to the prior CT and nonspecific but compatible with moderately severe chronic small vessel ischemic disease. Calcified atherosclerosis at the skull base. ORBITS: Right cataract extraction. SINUSES: No acute abnormality. SOFT TISSUES AND SKULL: No acute soft tissue abnormality. No skull fracture. IMPRESSION: 1. No acute intracranial abnormality. 2. Chronic small vessel ischemia and unchanged ventriculomegaly. Electronically signed by: Dasie Hamburg MD MD 02/25/2024 06:51 PM EST RP Workstation: [id number]     Procedures   Medications Ordered in the ED - No data to display  Medical Decision Making Amount and/or Complexity of Data Reviewed Radiology: ordered.   This patient presents to the ED for concern of fall. Differential diagnosis includes SAH, head injury, scalp laceration,    Additional history obtained:  Additional history obtained from chart review   Imaging Studies ordered:  I ordered imaging studies including CT head, CT cervical spine I independently visualized and interpreted imaging which showed negative for any acute findings I agree with the radiologist interpretation   Problem List / ED Course:  Patient presents to the emergency department with concerns of a fall and possible head injury.  Reportedly fell this morning between 6 or 7 AM about 11 hours ago after slipping on dry  Food and falling backward striking her head on a bookshelf.  Denies loss of consciousness.  She is on aspirin .  She states that she has felt somewhat queasy earlier but denies any vomiting.  No reported visual disturbance or visual  changes. Physical exam is largely unremarkable with no significant findings seen.  There is some slight tenderness towards the left occipital scalp with no hematoma, laceration, or abrasion seen in this area.  EOMs intact and pupils are PERRL. Workup with imaging of head and cervical spine negative for any acute findings.  I suspect most of pain is likely superficial in nature with impact to the left occipital scalp.  Encouraged use of Tylenol  ibuprofen  for pain.  Advise close follow-up with PCP.  She is otherwise stable for outpatient follow-up and discharged home.   Social Determinants of Health:  None  Final diagnoses:  Fall, initial encounter  Scalp pain    ED Discharge Orders     None          Cecily Legrand DELENA DEVONNA 02/25/24 1919    Patt Alm Macho, MD 02/25/24 2257  "

## 2024-02-25 NOTE — ED Triage Notes (Signed)
 Pt reports she slipped on dry cat food that was on the floor today. She reports she did hit the back of her head on a bookshelf but no LOC, no abrasions or lacerations, denies blood thinners. Denies any injuries, states she just wants to be evaluated. Ambulatory with cane, GCS 15.

## 2024-02-25 NOTE — ED Notes (Signed)

## 2024-02-25 NOTE — Discharge Instructions (Signed)
 You are seen in the emergency department today for concerns of a fall.  Your imaging of your head and neck were thankfully negative without any obvious or concerning findings seen from your fall.  Take Tylenol  ibuprofen  for pain.  Follow-up closely with your primary care provider.  For any concerns of new or worsening symptoms, return to the emergency department.

## 2024-02-26 ENCOUNTER — Other Ambulatory Visit: Payer: Self-pay | Admitting: Internal Medicine

## 2024-02-27 ENCOUNTER — Encounter: Payer: Self-pay | Admitting: *Deleted

## 2024-02-28 ENCOUNTER — Telehealth: Payer: Self-pay

## 2024-02-28 DIAGNOSIS — R413 Other amnesia: Secondary | ICD-10-CM

## 2024-02-28 DIAGNOSIS — Z8673 Personal history of transient ischemic attack (TIA), and cerebral infarction without residual deficits: Secondary | ICD-10-CM

## 2024-02-28 DIAGNOSIS — R42 Dizziness and giddiness: Secondary | ICD-10-CM

## 2024-02-28 DIAGNOSIS — E1159 Type 2 diabetes mellitus with other circulatory complications: Secondary | ICD-10-CM

## 2024-02-28 NOTE — Telephone Encounter (Unsigned)
 Copied from CRM [id number]. Topic: General - Other >> Feb 28, 2024  3:58 PM Berneda FALCON wrote: Reason for CRM: Daughter is stating patient got all the Home Health services except the home health aide. She was told by PCP to call if there were any issues and she still has not gotten the home health aide as of a couple of weeks now.  Additionally, daughter would like us  to call and speak with the child psychotherapist. Social [phone] Idell Bertrand)- states she would be eligible for Viya to receive additional care after the certification for HHA as she (patient) is also the caregiver to her disabled son.  Please call her back at [phone]

## 2024-02-28 NOTE — Telephone Encounter (Signed)
 Copied from CRM [id number]. Topic: Referral - Question >> Feb 28, 2024  3:56 PM Patricia Mccullough wrote: Reason for CRM: Daughter states that the neurology appt is too far out and she would like the order sent over to somewhere that can get her in sooner.  She is requesting. [facility] [address] [place], [place], [address] Clinic Hours: Monday - Friday 8 a.m. - 4 p.m. MRI: Tuesday 8 a.m. - 5 p.m. Sleep Center: Sunday - Friday 8 a.m. - 8 p.m. Phone [phone] Fax [phone]

## 2024-02-28 NOTE — Telephone Encounter (Signed)
 Copied from CRM #[id number]. Topic: Referral - Status >> Feb 28, 2024  8:42 AM Thersia BROCKS wrote: Reason for CRM: Patient daugther called in wanting to followup regarding the in home health aid , would like to know the status of this as daughter is stating that she is needing some assistance as it is getting more difficult to handle  [phone]

## 2024-02-29 NOTE — Telephone Encounter (Signed)
 Called and left message for Patricia Mccullough today requesting call back to see what is needed on our end for patient to have home health aid.  Normally when social worker is involved they will typically set this service up for patient based on evaluation and what is needed.

## 2024-02-29 NOTE — Telephone Encounter (Signed)
 Spoke with daughter today, Patricia Mccullough and informed her of where we were and that message had been left for [place] to return call to clinic.

## 2024-03-01 ENCOUNTER — Other Ambulatory Visit: Payer: Self-pay | Admitting: Internal Medicine

## 2024-03-01 NOTE — Addendum Note (Signed)
 Addended by: GEOFM GLADE PARAS on: 03/01/2024 03:04 PM   Modules accepted: Orders

## 2024-03-08 ENCOUNTER — Telehealth: Payer: Self-pay | Admitting: Internal Medicine

## 2024-03-08 NOTE — Telephone Encounter (Signed)
 I can order SW  - ordered.    I can not order a HHA since I last saw her in October.

## 2024-03-08 NOTE — Addendum Note (Signed)
 Addended by: GEOFM GLADE PARAS on: 03/08/2024 01:47 PM   Modules accepted: Orders

## 2024-03-08 NOTE — Progress Notes (Signed)
 Complex Care Management Note  Care Guide Note 03/08/2024 Name: Patricia Mccullough MRN: [id number] DOB: 07/17/1940  [name] is a 84 y.o. year old female who sees Burns, Glade PARAS, MD for primary care. I reached out to Gregorio Loreli Pouch by phone today to offer complex care management services.  [name] was given information about Complex Care Management services today including:   The Complex Care Management services include support from the care team which includes your Nurse Care Manager, Clinical Social Worker, or Pharmacist.  The Complex Care Management team is here to help remove barriers to the health concerns and goals most important to you. Complex Care Management services are voluntary, and the patient may decline or stop services at any time by request to their care team member.   Complex Care Management Consent Status: Patient agreed to services and verbal consent obtained.   Follow up plan:  Telephone appointment with complex care management team member scheduled for:  03/14/2024  Encounter Outcome:  Patient Scheduled  Doyce Razor [facility]  [facility], [facility] Guide Direct Dial: [phone]  Fax: [phone]

## 2024-03-14 ENCOUNTER — Other Ambulatory Visit: Payer: Self-pay

## 2024-03-14 ENCOUNTER — Telehealth: Payer: Self-pay

## 2024-03-14 NOTE — Patient Outreach (Signed)
 Complex Care Management   Visit Note  03/14/2024  Name:  Patricia Mccullough MRN: [id number] DOB: 1940-10-14  Situation: Referral received for Complex Care Management related to level care concerns related to memory deficits. I obtained verbal consent from Caregiver.  Visit completed with Caregiver  on the phone. LCSW had initial visit with Patricia Mccullough's daughter and Patricia Mccullough's daughter is requesting in home care to assist with Patricia Mccullough's memory deficits.  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   Thyroid  nodule 05/15/2013   Right side, IR bx 5/15: benign follicular nodule   TIA (transient ischemic attack)     Assessment: Patricia Mccullough Reported Symptoms:  Cognitive Cognitive Status: Struggling with memory recall, Requires Assistance Decision Making, Confused or disoriented Cognitive/Intellectual Conditions Management [RPT]: None reported or documented in medical history or problem list      Neurological Neurological Review of Symptoms: No symptoms reported    HEENT HEENT Symptoms Reported: No symptoms reported      Cardiovascular Cardiovascular Symptoms Reported: No symptoms reported    Respiratory Respiratory Symptoms Reported: Dry cough, Other: Other Respiratory Symptoms: Caregiver reports mucous Respiratory Management Strategies: Medication therapy  Endocrine Endocrine Symptoms Reported: No symptoms reported Is Patricia Mccullough diabetic?: Yes Is Patricia Mccullough checking blood sugars at home?: Yes List most recent blood sugar readings, include date and time of day: Patricia Mccullough has a continuous monitor    Gastrointestinal Gastrointestinal Symptoms Reported: No symptoms reported      Genitourinary Genitourinary Symptoms Reported: Urgency, Incontinence Genitourinary Management  Strategies: Incontinence garment/pad  Integumentary Integumentary Symptoms Reported: No symptoms reported    Musculoskeletal Musculoskelatal Symptoms Reviewed: Difficulty walking Musculoskeletal Management Strategies: Medical device Falls in the past year?: Yes Number of falls in past year: 2 or more Was there an injury with Fall?: No Fall Risk Category Calculator: 2 Patricia Mccullough Fall Risk Level: Moderate Fall Risk Patricia Mccullough at Risk for Falls Due to: History of fall(s) Fall risk Follow up: Falls evaluation completed  Psychosocial Psychosocial Symptoms Reported: No symptoms reported Additional Psychological Details: Caregiver reports Patricia Mccullough being anxious over her memory loss. Caregiver reports that when Patricia Mccullough begins taking medication for memory loss that anxious symptoms will subside. Behavioral Management Strategies: Support system Major Change/Loss/Stressor/Fears (CP): Medical condition, self Behaviors When Feeling Stressed/Fearful: Caregiver reports that Patricia Mccullough is anxious about memory loss and when she cannot find things. Quality of Family Relationships: helpful, supportive Do you feel physically threatened by others?: No    03/14/2024    PHQ2-9 Depression Screening   Little interest or pleasure in doing things Not at all  Feeling down, depressed, or hopeless Not at all  PHQ-2 - Total Score 0  Trouble falling or staying asleep, or sleeping too much    Feeling tired or having little energy    Poor appetite or overeating     Feeling bad about yourself - or that you are a failure or have let yourself or your family down    Trouble concentrating on things, such as reading the newspaper or watching television    Moving or speaking so slowly that other people could have noticed.  Or the opposite - being so fidgety or restless that you have been moving around a lot more than usual    Thoughts that you would be better off dead,  or hurting yourself in some way    PHQ2-9 Total Score    If you  checked off any problems, how difficult have these problems made it for you to do your work, take care of things at home, or get along with other people    Depression Interventions/Treatment      There were no vitals filed for this visit. Pain Scale: 0-10 Pain Score: 0-No pain  Medications Reviewed Today     Reviewed by Sherren Olam BRAVO, LCSW (Social Worker) on 03/14/24 at 1125  Med List Status: <None>   Medication Order Taking? Sig Documenting Provider Last Dose Status Informant  amLODipine  (NORVASC ) 2.5 MG tablet [id number] Yes Take 1 tablet (2.5 mg total) by mouth daily. Geofm Glade PARAS, MD  Active   aspirin  EC 325 MG tablet [id number] Yes Take 1 tablet (325 mg total) by mouth daily. Barbarann Nest, MD  Active   atorvastatin  (LIPITOR) 20 MG tablet [id number] Yes Take 1 tablet (20 mg total) by mouth daily. Geofm Glade PARAS, MD  Active Self  Continuous Glucose Sensor (FREESTYLE LIBRE 2 SENSOR) [place] [id number] Yes USE TO CHECK BLOOD SUGAR 4 TIMES DAILY Burns, Glade PARAS, MD  Active   feeding supplement (ENSURE PLUS HIGH PROTEIN) LIQD [id number]  Take 237 mLs by mouth 2 (two) times daily between meals.  Patricia Mccullough not taking: Reported on 03/14/2024   Barbarann Nest, MD  Active   fluticasone  (FLONASE ) 50 MCG/ACT nasal spray [id number] Yes Place 1 spray into both nostrils 2 (two) times daily as needed for allergies or rhinitis. [provider]  Active Self  JARDIANCE  10 MG TABS tablet [id number] Yes TAKE 1 TABLET BY MOUTH ONCE DAILY IN THE MORNING Burns, Glade PARAS, MD  Active   metFORMIN  (GLUCOPHAGE ) 1000 MG tablet [id number] Yes TAKE 1 TABLET BY MOUTH TWICE DAILY WITH A MEAL Burns, Glade PARAS, MD  Active   SYSTANE BALANCE 0.6 % SOLN [id number] Yes Place 1 drop into both eyes in the morning, at noon, in the evening, and at bedtime. [provider]  Active Self            Recommendation:   Continue Current Plan of Care Patricia Mccullough's daughter will collaborate with PCP office for [facility]  services.  Follow Up Plan:   Telephone follow-up 03/22/2024  Olam Sherren, MSW, LCSW [place]  [facility], [facility] Licensed Clinical Social Worker Direct Dial: [phone]

## 2024-03-14 NOTE — Telephone Encounter (Signed)
"  °  Follow up plan:  Telephone appointment with complex care management team member scheduled for:  03/14/2024 "

## 2024-03-14 NOTE — Telephone Encounter (Signed)
 Spoke with daughter, Patricia Mccullough today.  Appointment made for first available on Friday @ 10:00 am.  Daughter has been made aware insurance will require face to face visit for referral for OT/home health aid request.  Patient's daughter asked if patient can go to [facility] as they currently know the services.  [phone] is agency contact info.

## 2024-03-14 NOTE — Patient Instructions (Signed)
 Visit Information  Thank you for taking time to visit with me today. Please don't hesitate to contact me if I can be of assistance to you before our next scheduled appointment.  Our next appointment is by telephone on 03/22/2024 at 11:00 am Please call the care guide team at [phone] if you need to cancel or reschedule your appointment.   Following is a copy of your care plan:   Goals Addressed             This Visit's Progress    VBCI Social Work Care Plan:LCW       Problems:   Level of Care Concerns:Inability to perform ADL's independently Memory Deficits  CSW Clinical Goal(s):   Over the next 90 days the Caregiver(daughter) will work with Child Psychotherapist to address concerns related to patient performing ADLs which caregiver is requesting assistance to seek in home care through a Home Health Aide or personal care services as evidenced by patient getting linked to in home services.  . Over the next two weeks, caregiver will collaborate with PCP to get [facility] services initiated again.  Interventions:  Level of Care Concerns in a patient with DMII Current level of care: home, alone, patient cares for adult son Evaluation of patient's unmet needs in current living environment ADL's Assessed needs, level of care concerns, how currently meeting needs and barriers to care Discussed private pay options for personal care needs (Caregiver wants to check first if patient can receive OT services and a HHA first) Meals on wheels (Caregiver is wanting patient placed on MOW list)- LCSW will schedule with BSW at next schedule outreach. LCSW discussed that HHA are usually bundle with another services such as PT or OT. Patient has received PT and OT services however agency did not have HHA. Caregiver inquired if Medicare would pay for PCS services LCSW discussed that PCS services is typically an out of pocket expense. Caregiver merged in child psychotherapist from [facility]) and LCSW explained that  PCS services are paid through either Medicaid or out of pocket. Caregiver is requesting that patient receive OT services again with HHA bundle. Plan is for LCSW to follow up with with PCP office to see if they can write the [facility] order for OT. LCSW discussed with caregiver if patient would be eligible for Medicaid and patient's daughter report that patient would not be eligible.       Patient Goals/Self-Care Activities:  Coordinate with LCSW to assist with in home care.  Plan:   Telephone follow up appointment with care management team member scheduled for:  03/22/2024 at 11:00 am        Please call 911 if you are experiencing a Mental Health or Behavioral Health Crisis or need someone to talk to.  Patient's daughter verbalized understanding of Care plan and visit instructions communicated this visit  Olam Ally, MSW, LCSW [place]  [facility], [facility] Licensed Clinical Social Worker Direct Dial: [phone]

## 2024-03-15 ENCOUNTER — Encounter: Payer: Self-pay | Admitting: Internal Medicine

## 2024-03-15 ENCOUNTER — Other Ambulatory Visit: Payer: Self-pay | Admitting: Internal Medicine

## 2024-03-15 DIAGNOSIS — E113599 Type 2 diabetes mellitus with proliferative diabetic retinopathy without macular edema, unspecified eye: Secondary | ICD-10-CM

## 2024-03-15 NOTE — Progress Notes (Signed)
 "   Subjective:    Patient ID: Patricia Mccullough, female    DOB: 1940/05/19, 84 y.o.   MRN: [id number]      HPI Patricia Mccullough is here for  Chief Complaint  Patient presents with   Referral    Referral for OT/home health aid  Her daughter is on the phone and helps supplement the history.  Sees neuro 3/2  Sleepy all the time.  Not sure how much sleep she is getting.      Discussed the use of AI scribe software for clinical note transcription with the patient, who gave verbal consent to proceed.  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  She is experiencing significant memory issues and cognitive difficulties. She has not received adequate home health aide support, which was initially bundled with occupational, physical, and speech therapy post-stroke. She struggles with medication management, often repeating tasks due to losing her train of thought. Cooking is challenging as she tends to fall asleep while meals are cooking, leading to safety concerns such as setting off the smoke detector multiple times in a month. Her daughter reports that she needs assistance with tasks requiring sustained focus, although she manages personal care like dressing and bathing independently.  She feels consistently tired and sleepy, often falling asleep during the day if she sits down for a few minutes. She goes to bed early but wakes up early, around 5 AM, and sometimes stays up late watching TV. Her daughter notes that she falls asleep on the couch in the evening and is unsure when she actually goes to bed.  She is concerned about her glucose levels, which she feels are not well-controlled. Her continuous glucose monitor often shows high readings, with morning levels around 141 mg/dL. She is on metformin  1000 mg twice daily, Jardiance  10 mg once daily,  and atorvastatin  20 mg daily. She is also taking amlodipine  2.5 mg for blood pressure, which was reduced due to dizziness.  She experiences balance issues and weakness, particularly when outside the house, which affects her walking speed and energy levels. She feels lightheaded if she moves too quickly upon waking. No chest pain, heart racing, swelling in her legs, headaches, or significant shortness of breath, but notes some mucus in her throat, for which she takes Mucinex.  She has recently started taking Metamucil, although she has only taken a couple of doses. She also mentions difficulty recalling words, which concerns her, but she attributes it to age.    Medications and allergies reviewed with patient and updated if appropriate.  Medications Ordered Prior to Encounter[1]  Review of Systems  Constitutional:  Positive for fatigue.  Respiratory:  Negative for cough, shortness of breath and wheezing.   Cardiovascular:  Negative for chest pain, palpitations and leg swelling.  Musculoskeletal:  Positive for gait problem.  Neurological:  Positive for dizziness (if moves too fast). Negative for light-headedness and headaches.       Objective:   Vitals:   03/16/24 1112  BP: 118/70  Pulse: 74  Temp: 98.4 F (36.9 C)  SpO2: 96%   BP Readings from Last 3 Encounters:  03/16/24 118/70  02/25/24 126/61  12/01/23 124/74   Wt Readings from Last 3 Encounters:  03/16/24 134 lb (60.8 kg)  02/25/24 132 lb (59.9 kg)  12/01/23 132 lb (59.9 kg)   Body mass index is  25.32 kg/m.    Physical Exam Constitutional:      General: She is not in acute distress.    Appearance: Normal appearance.  HENT:     Head: Normocephalic and atraumatic.  Eyes:     Conjunctiva/sclera: Conjunctivae normal.  Cardiovascular:     Rate and Rhythm: Normal rate and regular rhythm.     Heart sounds: Murmur (2/6 systolic) heard.  Pulmonary:     Effort: Pulmonary effort is normal. No respiratory distress.      Breath sounds: Normal breath sounds. No wheezing.  Musculoskeletal:     Cervical back: Neck supple.     Right lower leg: No edema.     Left lower leg: No edema.  Lymphadenopathy:     Cervical: No cervical adenopathy.  Skin:    General: Skin is warm and dry.     Findings: No rash.  Neurological:     Mental Status: She is alert. Mental status is at baseline.  Psychiatric:        Mood and Affect: Mood normal.        Behavior: Behavior normal.            Assessment & Plan:    See Problem List for Assessment and Plan of chronic medical problems.         [1]  Current Outpatient Medications on File Prior to Visit  Medication Sig Dispense Refill   amLODipine  (NORVASC ) 2.5 MG tablet Take 1 tablet (2.5 mg total) by mouth daily. 90 tablet 1   aspirin  EC 325 MG tablet Take 1 tablet (325 mg total) by mouth daily.     atorvastatin  (LIPITOR) 20 MG tablet Take 1 tablet (20 mg total) by mouth daily. 90 tablet 2   Continuous Glucose Sensor (FREESTYLE LIBRE 2 SENSOR) MISC USE TO CHECK BLOOD SUGAR FOUR TIMES A DAY; CHANGE SENSOR EVERY 14 DAYS 2 each 0   fluticasone  (FLONASE ) 50 MCG/ACT nasal spray Place 1 spray into both nostrils 2 (two) times daily as needed for allergies or rhinitis.     JARDIANCE  10 MG TABS tablet TAKE 1 TABLET BY MOUTH ONCE DAILY IN THE MORNING 90 tablet 0   metFORMIN  (GLUCOPHAGE ) 1000 MG tablet TAKE 1 TABLET BY MOUTH TWICE DAILY WITH A MEAL 180 tablet 0   SYSTANE BALANCE 0.6 % SOLN Place 1 drop into both eyes in the morning, at noon, in the evening, and at bedtime.     feeding supplement (ENSURE PLUS HIGH PROTEIN) LIQD Take 237 mLs by mouth 2 (two) times daily between meals. (Patient not taking: Reported on 03/16/2024)     No current facility-administered medications on file prior to visit.   "

## 2024-03-15 NOTE — Telephone Encounter (Unsigned)
 Copied from CRM [id number]. Topic: General - Call Back - No Documentation >> Mar 15, 2024  2:31 PM Rea C wrote: Reason for CRM: A neurology office in [facility] because patient is not sure of the office who called her but she said they called patient from a Neurology office in [facility] and told her to call [facility] and advise for Dr. Geofm to call her insurance [employer] and verify that she needs to be seen by their neurology office due to out of network. She has an appointment with the Neurologist in March though with [facility].  Patient would like for Dr. Geofm to make the call and also provide her with the information in regards to co-pays and not having to pay so much out of pocket.   [phone] (M)

## 2024-03-15 NOTE — Telephone Encounter (Signed)
 Spoke with patient today.  Called neurology and spoke with Shonda.  Patient needs insurance referral with [facility].  First available they had was 08/30/24.  Patient and daughter made aware and will keep original appointment for March.

## 2024-03-16 ENCOUNTER — Ambulatory Visit: Admitting: Internal Medicine

## 2024-03-16 VITALS — BP 118/70 | HR 74 | Temp 98.4°F | Ht 61.0 in | Wt 134.0 lb

## 2024-03-16 DIAGNOSIS — E1159 Type 2 diabetes mellitus with other circulatory complications: Secondary | ICD-10-CM | POA: Diagnosis not present

## 2024-03-16 DIAGNOSIS — E1169 Type 2 diabetes mellitus with other specified complication: Secondary | ICD-10-CM | POA: Diagnosis not present

## 2024-03-16 DIAGNOSIS — Z8673 Personal history of transient ischemic attack (TIA), and cerebral infarction without residual deficits: Secondary | ICD-10-CM

## 2024-03-16 DIAGNOSIS — R5381 Other malaise: Secondary | ICD-10-CM | POA: Diagnosis not present

## 2024-03-16 DIAGNOSIS — R2689 Other abnormalities of gait and mobility: Secondary | ICD-10-CM | POA: Diagnosis not present

## 2024-03-16 DIAGNOSIS — E785 Hyperlipidemia, unspecified: Secondary | ICD-10-CM

## 2024-03-16 DIAGNOSIS — R413 Other amnesia: Secondary | ICD-10-CM

## 2024-03-16 DIAGNOSIS — Z7984 Long term (current) use of oral hypoglycemic drugs: Secondary | ICD-10-CM | POA: Diagnosis not present

## 2024-03-16 DIAGNOSIS — E113299 Type 2 diabetes mellitus with mild nonproliferative diabetic retinopathy without macular edema, unspecified eye: Secondary | ICD-10-CM | POA: Diagnosis not present

## 2024-03-16 DIAGNOSIS — I152 Hypertension secondary to endocrine disorders: Secondary | ICD-10-CM | POA: Diagnosis not present

## 2024-03-16 LAB — LIPID PANEL
Cholesterol: 104 mg/dL (ref 28–200)
HDL: 61.3 mg/dL
LDL Cholesterol: 18 mg/dL (ref 10–99)
NonHDL: 42.89
Total CHOL/HDL Ratio: 2
Triglycerides: 122 mg/dL (ref 10.0–149.0)
VLDL: 24.4 mg/dL (ref 0.0–40.0)

## 2024-03-16 LAB — CBC WITH DIFFERENTIAL/PLATELET
Basophils Absolute: 0.1 10*3/uL (ref 0.0–0.1)
Basophils Relative: 0.7 % (ref 0.0–3.0)
Eosinophils Absolute: 0.2 10*3/uL (ref 0.0–0.7)
Eosinophils Relative: 2.5 % (ref 0.0–5.0)
HCT: 36.2 % (ref 36.0–46.0)
Hemoglobin: 11.9 g/dL — ABNORMAL LOW (ref 12.0–15.0)
Lymphocytes Relative: 26.4 % (ref 12.0–46.0)
Lymphs Abs: 2.1 10*3/uL (ref 0.7–4.0)
MCHC: 32.8 g/dL (ref 30.0–36.0)
MCV: 94 fl (ref 78.0–100.0)
Monocytes Absolute: 0.8 10*3/uL (ref 0.1–1.0)
Monocytes Relative: 10.2 % (ref 3.0–12.0)
Neutro Abs: 4.7 10*3/uL (ref 1.4–7.7)
Neutrophils Relative %: 60.2 % (ref 43.0–77.0)
Platelets: 206 10*3/uL (ref 150.0–400.0)
RBC: 3.85 Mil/uL — ABNORMAL LOW (ref 3.87–5.11)
RDW: 15.4 % (ref 11.5–15.5)
WBC: 7.8 10*3/uL (ref 4.0–10.5)

## 2024-03-16 LAB — COMPREHENSIVE METABOLIC PANEL WITH GFR
ALT: 16 U/L (ref 3–35)
AST: 16 U/L (ref 5–37)
Albumin: 3.8 g/dL (ref 3.5–5.2)
Alkaline Phosphatase: 60 U/L (ref 39–117)
BUN: 30 mg/dL — ABNORMAL HIGH (ref 6–23)
CO2: 30 meq/L (ref 19–32)
Calcium: 10 mg/dL (ref 8.4–10.5)
Chloride: 101 meq/L (ref 96–112)
Creatinine, Ser: 1.44 mg/dL — ABNORMAL HIGH (ref 0.40–1.20)
GFR: 33.87 mL/min — ABNORMAL LOW
Glucose, Bld: 124 mg/dL — ABNORMAL HIGH (ref 70–99)
Potassium: 4 meq/L (ref 3.5–5.1)
Sodium: 139 meq/L (ref 135–145)
Total Bilirubin: 0.5 mg/dL (ref 0.2–1.2)
Total Protein: 7.7 g/dL (ref 6.0–8.3)

## 2024-03-16 LAB — HEMOGLOBIN A1C: Hgb A1c MFr Bld: 8.1 % — ABNORMAL HIGH (ref 4.6–6.5)

## 2024-03-16 LAB — VITAMIN B12: Vitamin B-12: >1500 pg/mL — ABNORMAL HIGH (ref 211–911)

## 2024-03-16 LAB — TSH: TSH: 0.72 u[IU]/mL (ref 0.35–5.50)

## 2024-03-16 NOTE — Assessment & Plan Note (Signed)
 Chronic Associated with vascular disease-history of CVA, hypertension, hyperlipidemia Lab Results  Component Value Date   HGBA1C 8.1 (H) 03/16/2024   Sugars not ideally controlled She has been checking sugars at home but did not bring her readings with her Not sure if she is taking her medications how she should be or if she is eating how she should be Continue Jardiance  10 mg daily, metformin  1000 mg twice daily Will put referral in for her pharmacist to help evaluate medications, sugar control

## 2024-03-16 NOTE — Assessment & Plan Note (Signed)
 Chronic Worse after stroke as last fall Could use additional PT and OT-ordered home OT and PT today

## 2024-03-16 NOTE — Patient Instructions (Addendum)
" ° ° ° ° °  Blood work was ordered.       Medications changes include :   None    A referral was ordered for home health \\and  someone will call you to schedule an appointment.     Return in about 6 months (around 09/13/2024) for follow up, cancel February appt.  "

## 2024-03-16 NOTE — Assessment & Plan Note (Signed)
 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

## 2024-03-16 NOTE — Assessment & Plan Note (Signed)
 Chronic Blood pressure well-controlled Advised monitoring it at home to make sure it is not dropping too low Continue amlodipine  to 2.5 mg daily CBC, CMP

## 2024-03-16 NOTE — Assessment & Plan Note (Signed)
 Chronic Will need to get her sugars better controlled Advised yearly eye exams

## 2024-03-16 NOTE — Assessment & Plan Note (Signed)
 Chronic Regular exercise and healthy diet encouraged Lab Results  Component Value Date   LDLCALC 18 03/16/2024  LDL at goal Continue atorvastatin  20 mg daily

## 2024-03-16 NOTE — Assessment & Plan Note (Signed)
 Subacute Started to become more of an issue last year after her stroke There is most likely a vascular component Has an appointment with neurology for further evaluation and treatment Will order home PT, OT and home health aide.  Discussed with her daughter that this home health aide is only temporary. If she requires further help at home and may need to be out-of-pocket

## 2024-03-17 ENCOUNTER — Ambulatory Visit: Payer: Self-pay | Admitting: Internal Medicine

## 2024-03-17 DIAGNOSIS — E1159 Type 2 diabetes mellitus with other circulatory complications: Secondary | ICD-10-CM

## 2024-03-19 ENCOUNTER — Telehealth: Payer: Self-pay

## 2024-03-19 NOTE — Progress Notes (Signed)
 Care Guide Pharmacy Note  03/19/2024 Name: Patricia Mccullough MRN: [id number] DOB: Apr 01, 1940  Referred By: Geofm Glade PARAS, MD Reason for referral: Call Attempt #1 and Complex Care Management (Outreach to sch ref w/ pharm)   Patricia Mccullough is a 84 y.o. year old female who is a primary care patient of Geofm Glade PARAS, MD.  Patricia Mccullough was referred to the pharmacist for assistance related to: DMII  Successful contact was made with the patient to discuss pharmacy services including being ready for the pharmacist to call at least 5 minutes before the scheduled appointment time and to have medication bottles and any blood pressure readings ready for review. The patient agreed to meet with the pharmacist via telephone visit on 03/29/2024.  Doyce Razor [facility], [facility] Guide Direct Dial: [phone]  Fax: [phone]

## 2024-03-22 ENCOUNTER — Other Ambulatory Visit

## 2024-03-22 NOTE — Patient Outreach (Signed)
 Complex Care Management   Visit Note  03/22/2024  Name:  Patricia Mccullough MRN: [id number] DOB: 10/18/1940  Situation: Referral received for Complex Care Management related to level care concerns related to memory deficits. I obtained verbal consent from Caregiver.  Visit completed with Caregiver  on the phone. LCSW had follow up visit with patient's daughter and patient's daughter is requesting the name of the [facility] agency that the referral was made. LCSW reached out to PCP office to find out agency.  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   Thyroid  nodule 05/15/2013   Right side, IR bx 5/15: benign follicular nodule   TIA (transient ischemic attack)     Assessment: Patient Reported Symptoms:  Cognitive Cognitive Status: Struggling with memory recall, Requires Assistance Decision Making, Confused or disoriented Cognitive/Intellectual Conditions Management [RPT]: None reported or documented in medical history or problem list      Neurological Neurological Review of Symptoms: Not assessed    HEENT HEENT Symptoms Reported: Not assessed      Cardiovascular Cardiovascular Symptoms Reported: Not assessed    Respiratory Respiratory Symptoms Reported: Not assesed    Endocrine Endocrine Symptoms Reported: Not assessed    Gastrointestinal Gastrointestinal Symptoms Reported: Not assessed      Genitourinary Genitourinary Symptoms Reported: Not assessed    Integumentary Integumentary Symptoms Reported: Not assessed    Musculoskeletal Musculoskelatal Symptoms Reviewed: Not assessed        Psychosocial Psychosocial Symptoms Reported: Not assessed          03/22/2024    PHQ2-9 Depression Screening   Little interest or pleasure in doing things    Feeling down,  depressed, or hopeless    PHQ-2 - Total Score    Trouble falling or staying asleep, or sleeping too much    Feeling tired or having little energy    Poor appetite or overeating     Feeling bad about yourself - or that you are a failure or have let yourself or your family down    Trouble concentrating on things, such as reading the newspaper or watching television    Moving or speaking so slowly that other people could have noticed.  Or the opposite - being so fidgety or restless that you have been moving around a lot more than usual    Thoughts that you would be better off dead, or hurting yourself in some way    PHQ2-9 Total Score    If you checked off any problems, how difficult have these problems made it for you to do your work, take care of things at home, or get along with other people    Depression Interventions/Treatment      There were no vitals filed for this visit.    Medications Reviewed Today   Medications were not reviewed in this encounter     Recommendation:   Continue Current Plan of Care  Follow Up Plan:   Telephone follow-up 04/05/2024 11;00 am  Olam Ally, MSW, LCSW [place]  [facility], [facility] Licensed Clinical Social Worker Direct Dial: [phone]

## 2024-03-22 NOTE — Patient Instructions (Signed)
 Visit Information  Thank you for taking time to visit with me today. Please don't hesitate to contact me if I can be of assistance to you before our next scheduled appointment.  Your next care management appointment is by telephone on 04/05/2024 at 11:00 am   Please call the care guide team at [phone] if you need to cancel, schedule, or reschedule an appointment.   Please call 911 if you are experiencing a Mental Health or Behavioral Health Crisis or need someone to talk to.  Olam Ally, MSW, LCSW [place]  [facility], [facility] Licensed Clinical Social Worker Direct Dial: [phone]

## 2024-03-29 ENCOUNTER — Other Ambulatory Visit

## 2024-04-05 ENCOUNTER — Telehealth

## 2024-04-13 ENCOUNTER — Encounter: Admitting: Internal Medicine

## 2024-04-16 ENCOUNTER — Institutional Professional Consult (permissible substitution): Admitting: Neurology

## 2024-06-25 ENCOUNTER — Ambulatory Visit: Admitting: Podiatry

## 2024-08-08 ENCOUNTER — Ambulatory Visit

## 2024-09-13 ENCOUNTER — Ambulatory Visit: Admitting: Internal Medicine
